# Patient Record
Sex: Female | Born: 1935
Health system: Southern US, Community
[De-identification: ages and names within clinical notes are randomized; demographics above are authoritative.]

## PROBLEM LIST (undated history)

## (undated) DIAGNOSIS — F32A Depression, unspecified: Secondary | ICD-10-CM

## (undated) DIAGNOSIS — Z8619 Personal history of other infectious and parasitic diseases: Secondary | ICD-10-CM

## (undated) DIAGNOSIS — I214 Non-ST elevation (NSTEMI) myocardial infarction: Secondary | ICD-10-CM

## (undated) DIAGNOSIS — K219 Gastro-esophageal reflux disease without esophagitis: Secondary | ICD-10-CM

## (undated) DIAGNOSIS — I480 Paroxysmal atrial fibrillation: Secondary | ICD-10-CM

## (undated) DIAGNOSIS — E785 Hyperlipidemia, unspecified: Secondary | ICD-10-CM

## (undated) DIAGNOSIS — N183 Chronic kidney disease, stage 3 unspecified: Secondary | ICD-10-CM

## (undated) DIAGNOSIS — C439 Malignant melanoma of skin, unspecified: Secondary | ICD-10-CM

## (undated) DIAGNOSIS — I340 Nonrheumatic mitral (valve) insufficiency: Secondary | ICD-10-CM

## (undated) DIAGNOSIS — F329 Major depressive disorder, single episode, unspecified: Secondary | ICD-10-CM

## (undated) HISTORY — DX: Hyperlipidemia, unspecified: E78.5

## (undated) HISTORY — DX: Gastro-esophageal reflux disease without esophagitis: K21.9

## (undated) HISTORY — DX: Depression, unspecified: F32.A

## (undated) HISTORY — DX: Nonrheumatic mitral (valve) insufficiency: I34.0

## (undated) HISTORY — PX: CATARACT EXTRACTION: SUR2

## (undated) HISTORY — PX: TONSILLECTOMY: SUR1361

## (undated) HISTORY — PX: SKIN BIOPSY: SHX1

## (undated) HISTORY — DX: Major depressive disorder, single episode, unspecified: F32.9

## (undated) HISTORY — DX: Personal history of other infectious and parasitic diseases: Z86.19

## (undated) HISTORY — DX: Paroxysmal atrial fibrillation: I48.0

## (undated) HISTORY — PX: APPENDECTOMY: SHX54

## (undated) HISTORY — DX: Malignant melanoma of skin, unspecified: C43.9

---

## 1997-06-07 ENCOUNTER — Ambulatory Visit (HOSPITAL_COMMUNITY): Admission: RE | Admit: 1997-06-07 | Discharge: 1997-06-07 | Payer: Self-pay | Admitting: Internal Medicine

## 1998-02-28 ENCOUNTER — Other Ambulatory Visit: Admission: RE | Admit: 1998-02-28 | Discharge: 1998-02-28 | Payer: Self-pay | Admitting: Internal Medicine

## 1998-06-11 ENCOUNTER — Encounter (HOSPITAL_BASED_OUTPATIENT_CLINIC_OR_DEPARTMENT_OTHER): Payer: Self-pay | Admitting: Internal Medicine

## 1998-06-11 ENCOUNTER — Ambulatory Visit (HOSPITAL_COMMUNITY): Admission: RE | Admit: 1998-06-11 | Discharge: 1998-06-11 | Payer: Self-pay | Admitting: Internal Medicine

## 1999-03-05 ENCOUNTER — Other Ambulatory Visit: Admission: RE | Admit: 1999-03-05 | Discharge: 1999-03-05 | Payer: Self-pay | Admitting: Internal Medicine

## 1999-06-17 ENCOUNTER — Encounter (HOSPITAL_BASED_OUTPATIENT_CLINIC_OR_DEPARTMENT_OTHER): Payer: Self-pay | Admitting: Internal Medicine

## 1999-06-17 ENCOUNTER — Ambulatory Visit (HOSPITAL_COMMUNITY): Admission: RE | Admit: 1999-06-17 | Discharge: 1999-06-17 | Payer: Self-pay | Admitting: Internal Medicine

## 2000-06-20 ENCOUNTER — Ambulatory Visit (HOSPITAL_COMMUNITY): Admission: RE | Admit: 2000-06-20 | Discharge: 2000-06-20 | Payer: Self-pay | Admitting: Internal Medicine

## 2000-06-20 ENCOUNTER — Encounter (HOSPITAL_BASED_OUTPATIENT_CLINIC_OR_DEPARTMENT_OTHER): Payer: Self-pay | Admitting: Internal Medicine

## 2001-07-11 ENCOUNTER — Encounter: Payer: Self-pay | Admitting: Internal Medicine

## 2001-07-11 ENCOUNTER — Ambulatory Visit (HOSPITAL_COMMUNITY): Admission: RE | Admit: 2001-07-11 | Discharge: 2001-07-11 | Payer: Self-pay | Admitting: Internal Medicine

## 2001-10-02 ENCOUNTER — Ambulatory Visit (HOSPITAL_COMMUNITY): Admission: RE | Admit: 2001-10-02 | Discharge: 2001-10-02 | Payer: Self-pay | Admitting: Internal Medicine

## 2002-08-08 ENCOUNTER — Encounter: Payer: Self-pay | Admitting: Internal Medicine

## 2002-08-08 ENCOUNTER — Ambulatory Visit (HOSPITAL_COMMUNITY): Admission: RE | Admit: 2002-08-08 | Discharge: 2002-08-08 | Payer: Self-pay | Admitting: Internal Medicine

## 2003-08-12 ENCOUNTER — Encounter: Admission: RE | Admit: 2003-08-12 | Discharge: 2003-08-12 | Payer: Self-pay | Admitting: Internal Medicine

## 2003-09-30 ENCOUNTER — Ambulatory Visit (HOSPITAL_COMMUNITY): Admission: RE | Admit: 2003-09-30 | Discharge: 2003-09-30 | Payer: Self-pay | Admitting: Internal Medicine

## 2004-05-27 ENCOUNTER — Ambulatory Visit: Payer: Self-pay | Admitting: Internal Medicine

## 2004-06-08 ENCOUNTER — Ambulatory Visit: Payer: Self-pay | Admitting: Internal Medicine

## 2004-10-13 ENCOUNTER — Ambulatory Visit (HOSPITAL_COMMUNITY): Admission: RE | Admit: 2004-10-13 | Discharge: 2004-10-13 | Payer: Self-pay | Admitting: Internal Medicine

## 2005-04-30 ENCOUNTER — Emergency Department (HOSPITAL_COMMUNITY): Admission: EM | Admit: 2005-04-30 | Discharge: 2005-04-30 | Payer: Self-pay | Admitting: Emergency Medicine

## 2005-11-04 ENCOUNTER — Ambulatory Visit (HOSPITAL_COMMUNITY): Admission: RE | Admit: 2005-11-04 | Discharge: 2005-11-04 | Payer: Self-pay | Admitting: Internal Medicine

## 2005-12-01 ENCOUNTER — Ambulatory Visit: Payer: Self-pay | Admitting: Internal Medicine

## 2006-02-03 ENCOUNTER — Ambulatory Visit: Payer: Self-pay | Admitting: Internal Medicine

## 2006-02-16 ENCOUNTER — Inpatient Hospital Stay (HOSPITAL_COMMUNITY): Admission: EM | Admit: 2006-02-16 | Discharge: 2006-02-17 | Payer: Self-pay | Admitting: Emergency Medicine

## 2006-02-17 ENCOUNTER — Encounter (INDEPENDENT_AMBULATORY_CARE_PROVIDER_SITE_OTHER): Payer: Self-pay | Admitting: *Deleted

## 2006-11-08 ENCOUNTER — Ambulatory Visit (HOSPITAL_COMMUNITY): Admission: RE | Admit: 2006-11-08 | Discharge: 2006-11-08 | Payer: Self-pay | Admitting: Internal Medicine

## 2007-03-22 ENCOUNTER — Ambulatory Visit: Payer: Self-pay

## 2007-06-06 ENCOUNTER — Ambulatory Visit: Payer: Self-pay | Admitting: *Deleted

## 2007-08-08 ENCOUNTER — Encounter: Admission: RE | Admit: 2007-08-08 | Discharge: 2007-08-08 | Payer: Self-pay | Admitting: Internal Medicine

## 2007-08-08 ENCOUNTER — Encounter (INDEPENDENT_AMBULATORY_CARE_PROVIDER_SITE_OTHER): Payer: Self-pay | Admitting: *Deleted

## 2007-11-14 ENCOUNTER — Ambulatory Visit (HOSPITAL_COMMUNITY): Admission: RE | Admit: 2007-11-14 | Discharge: 2007-11-14 | Payer: Self-pay | Admitting: Internal Medicine

## 2008-11-18 ENCOUNTER — Ambulatory Visit (HOSPITAL_COMMUNITY): Admission: RE | Admit: 2008-11-18 | Discharge: 2008-11-18 | Payer: Self-pay | Admitting: Internal Medicine

## 2009-05-28 ENCOUNTER — Encounter (INDEPENDENT_AMBULATORY_CARE_PROVIDER_SITE_OTHER): Payer: Self-pay | Admitting: *Deleted

## 2009-07-01 ENCOUNTER — Encounter: Payer: Self-pay | Admitting: Internal Medicine

## 2009-07-02 ENCOUNTER — Ambulatory Visit: Payer: Self-pay | Admitting: Internal Medicine

## 2009-07-02 DIAGNOSIS — R413 Other amnesia: Secondary | ICD-10-CM | POA: Insufficient documentation

## 2009-07-02 DIAGNOSIS — R0602 Shortness of breath: Secondary | ICD-10-CM | POA: Insufficient documentation

## 2009-07-02 DIAGNOSIS — R4701 Aphasia: Secondary | ICD-10-CM | POA: Insufficient documentation

## 2009-07-02 DIAGNOSIS — F801 Expressive language disorder: Secondary | ICD-10-CM

## 2009-07-02 DIAGNOSIS — E785 Hyperlipidemia, unspecified: Secondary | ICD-10-CM

## 2009-07-03 ENCOUNTER — Telehealth: Payer: Self-pay | Admitting: Internal Medicine

## 2009-07-07 ENCOUNTER — Encounter (INDEPENDENT_AMBULATORY_CARE_PROVIDER_SITE_OTHER): Payer: Self-pay | Admitting: *Deleted

## 2009-07-07 ENCOUNTER — Telehealth: Payer: Self-pay | Admitting: Internal Medicine

## 2009-07-14 ENCOUNTER — Telehealth: Payer: Self-pay | Admitting: Internal Medicine

## 2009-07-22 ENCOUNTER — Ambulatory Visit: Payer: Self-pay

## 2009-07-22 ENCOUNTER — Encounter: Payer: Self-pay | Admitting: Internal Medicine

## 2009-07-22 ENCOUNTER — Ambulatory Visit (HOSPITAL_COMMUNITY): Admission: RE | Admit: 2009-07-22 | Discharge: 2009-07-22 | Payer: Self-pay | Admitting: Internal Medicine

## 2009-07-22 ENCOUNTER — Ambulatory Visit: Payer: Self-pay | Admitting: Cardiovascular Disease

## 2009-07-28 ENCOUNTER — Telehealth: Payer: Self-pay | Admitting: Internal Medicine

## 2009-07-29 DIAGNOSIS — Z860100 Personal history of colon polyps, unspecified: Secondary | ICD-10-CM | POA: Insufficient documentation

## 2009-07-29 DIAGNOSIS — K5730 Diverticulosis of large intestine without perforation or abscess without bleeding: Secondary | ICD-10-CM | POA: Insufficient documentation

## 2009-07-29 DIAGNOSIS — K219 Gastro-esophageal reflux disease without esophagitis: Secondary | ICD-10-CM | POA: Insufficient documentation

## 2009-07-29 DIAGNOSIS — J449 Chronic obstructive pulmonary disease, unspecified: Secondary | ICD-10-CM

## 2009-07-29 DIAGNOSIS — A389 Scarlet fever, uncomplicated: Secondary | ICD-10-CM | POA: Insufficient documentation

## 2009-08-05 ENCOUNTER — Ambulatory Visit: Payer: Self-pay | Admitting: Internal Medicine

## 2009-08-26 ENCOUNTER — Ambulatory Visit: Payer: Self-pay | Admitting: Internal Medicine

## 2009-08-26 DIAGNOSIS — I4891 Unspecified atrial fibrillation: Secondary | ICD-10-CM | POA: Insufficient documentation

## 2009-09-05 ENCOUNTER — Ambulatory Visit: Payer: Self-pay

## 2009-09-05 ENCOUNTER — Ambulatory Visit: Payer: Self-pay | Admitting: Internal Medicine

## 2009-09-10 ENCOUNTER — Encounter: Payer: Self-pay | Admitting: Internal Medicine

## 2009-09-23 ENCOUNTER — Ambulatory Visit: Payer: Self-pay | Admitting: Internal Medicine

## 2009-09-23 ENCOUNTER — Ambulatory Visit: Payer: Self-pay

## 2009-09-23 ENCOUNTER — Encounter: Payer: Self-pay | Admitting: Internal Medicine

## 2009-10-07 ENCOUNTER — Ambulatory Visit: Payer: Self-pay | Admitting: Internal Medicine

## 2009-10-23 ENCOUNTER — Telehealth: Payer: Self-pay | Admitting: Internal Medicine

## 2009-11-05 ENCOUNTER — Ambulatory Visit: Payer: Self-pay | Admitting: Internal Medicine

## 2009-11-05 DIAGNOSIS — R5383 Other fatigue: Secondary | ICD-10-CM

## 2009-11-05 DIAGNOSIS — R5381 Other malaise: Secondary | ICD-10-CM

## 2009-12-29 ENCOUNTER — Ambulatory Visit (HOSPITAL_COMMUNITY): Admission: RE | Admit: 2009-12-29 | Discharge: 2009-12-29 | Payer: Self-pay | Admitting: Internal Medicine

## 2010-02-09 ENCOUNTER — Ambulatory Visit: Payer: Self-pay | Admitting: Internal Medicine

## 2010-04-16 ENCOUNTER — Telehealth: Payer: Self-pay | Admitting: Internal Medicine

## 2010-04-17 ENCOUNTER — Encounter: Payer: Self-pay | Admitting: Internal Medicine

## 2010-04-30 ENCOUNTER — Encounter: Payer: Self-pay | Admitting: Internal Medicine

## 2010-05-03 LAB — CONVERTED CEMR LAB
Basophils Absolute: 0.1 10*3/uL (ref 0.0–0.1)
Basophils Relative: 0.7 % (ref 0.0–3.0)
CO2: 32 meq/L (ref 19–32)
Creatinine, Ser: 1.2 mg/dL (ref 0.4–1.2)
HCT: 39 % (ref 36.0–46.0)
Hemoglobin: 13.4 g/dL (ref 12.0–15.0)
Monocytes Relative: 10.1 % (ref 3.0–12.0)
Potassium: 5 meq/L (ref 3.5–5.1)
RBC: 4.15 M/uL (ref 3.87–5.11)
RDW: 14.2 % (ref 11.5–14.6)
T3, Free: 2.4 pg/mL (ref 2.3–4.2)
WBC: 7.6 10*3/uL (ref 4.5–10.5)

## 2010-05-05 NOTE — Assessment & Plan Note (Signed)
Summary: NEEDS COLONOSCOPY, IS ON COUMADIN          Nancy [name]   History of Present Illness Visit Type: Initial Visit Primary GI MD: Lina Sar MD Primary Provider: Creola Corn, MD Chief Complaint: fecal urgency, loose stools, patient on coumadin History of Present Illness:   This is a very nice 77 year old white female with a history of adenmatous polyps seen on a colonoscopy in 1998 which was a tubular adenoma. There were no polyps on hier last colonoscopy in March 2006 but it did show moderately severe diverticulosis of the sigmoid colon. Patient has been on Coumadin for a questionable history of TIA's. She is very active. A recall colonoscopy would be due in 5 years according to old guidelines but 7-10 years according to the new guidelines. Her symptoms include urgent, frequent small bowel movements and leakage of stool.   GI Review of Systems      Denies abdominal pain, acid reflux, belching, bloating, chest pain, dysphagia with liquids, dysphagia with solids, heartburn, loss of appetite, nausea, vomiting, vomiting blood, weight loss, and  weight gain.      Reports change in bowel habits and  diarrhea.     Denies anal fissure, black tarry stools, constipation, diverticulosis, fecal incontinence, heme positive stool, hemorrhoids, irritable bowel syndrome, jaundice, light color stool, liver problems, rectal bleeding, and  rectal pain.    Current Medications (verified): 1)  Multivitamins   Tabs (Multiple Vitamin) .Marland Kitchen.. 1 By Mouth Daily 2)  Oscal 500/200 D-3 500-200 Mg-Unit Tabs (Calcium-Vitamin D) .Marland Kitchen.. 1 By Mouth Daily 3)  Vitamin B Complex-C   Caps (B Complex-C) .Marland Kitchen.. 1 By Mouth Daily 4)  Biotin .Marland Kitchen.. 1 By Mouth Daily 5)  Aspirin 81 Mg Tbec (Aspirin) .... Take One Tablet By Mouth Daily 6)  Metoprolol Succinate 50 Mg Xr24h-Tab (Metoprolol Succinate) .... Take One Tablet By Mouth Daily 7)  Coumadin 3 Mg Tabs (Warfarin Sodium) .... Once Daily As Directed 8)  Simvastatin 20 Mg Tabs (Simvastatin)  .... Once Daily 9)  Ginkoba 40 Mg Tabs (Ginkgo Biloba) .... Once Daily  Allergies (verified): No Known Drug Allergies  Past History:  Past Medical History: 1. Migraines 2. GERD 3. Dyslipidemia 4. Scarlet fever as a child 5. H/o possible TIA     --carotid u/s 06/2007; 1-39% B Depression Melenoma  Past Surgical History: Reviewed history from 07/29/2009 and no changes required. Tonsillectomy Appendectomy Cataract extraction-bilateral  Family History: Reviewed history from 07/29/2009 and no changes required. Negative for stroke or premature coronary disease No FH of Colon Cancer: Family History of Colon Polyps:  Social History: Married --  husband who has dementia Tobacco Use - Former.  Alcohol Use - yes-occasional Illicit Drug Use - no Daily Caffeine Use 2-3  Review of Systems       The patient complains of cough, fatigue, heart rhythm changes, and night sweats.  The patient denies allergy/sinus, anemia, anxiety-new, arthritis/joint pain, back pain, blood in urine, breast changes/lumps, change in vision, confusion, coughing up blood, depression-new, fainting, fever, headaches-new, hearing problems, heart murmur, itching, menstrual pain, muscle pains/cramps, nosebleeds, pregnancy symptoms, shortness of breath, skin rash, sleeping problems, sore throat, swelling of feet/legs, swollen lymph glands, thirst - excessive , urination - excessive , urination changes/pain, urine leakage, vision changes, and voice change.         Pertinent positive and negative review of systems were noted in the above HPI. All other ROS was otherwise negative.   Vital Signs:  Patient profile:   77 year old  female Height:      65 inches Weight:      149.13 pounds BMI:     24.91 Pulse rate:   84 / minute Pulse rhythm:   regular BP sitting:   132 / 60  (left arm) Cuff size:   regular  Vitals Entered By: June McMurray CMA Duncan Dull) (Aug 05, 2009 4:38 PM)  Physical Exam  General:  Well developed,  well nourished, no acute distress. Eyes:  PERRLA, no icterus. Neck:  Supple; no masses or thyromegaly. Lungs:  Clear throughout to auscultation. Heart:  Regular rate and rhythm; no murmurs, rubs,  or bruits. Abdomen:  Nontender abdomen with minimal discomfort in left lower quadrant but no palpable mass. Liver edge at costal margin. Bowel sounds are normal active. Rectal:  decreased rectal sphincter tone with small amount of stool around the rectal area and small amount of Hemoccult negative stool in the rectal ampulla. There are no hemorrhoids. Extremities:  No clubbing, cyanosis, edema or deformities noted. Skin:  Intact without significant lesions or rashes. Psych:  Alert and cooperative. Normal mood and affect.   Impression & Recommendations:  Problem # 1:  COLONIC POLYPS, ADENOMATOUS, HX OF (ICD-V12.72) Patient has a history of an adenomatous polyp in 1998. There were no polyps on 2 subsequent colonoscopies. There is no need for a recall colonoscopy at this time especially since she is on Coumadin and taking her off Coumadin would pose a high thromboembolic risk.  Problem # 2:  DIVERTICULOSIS, COLON (ICD-562.10) Patient has moderately severe diverticulosis of the sigmoid colon which is symptomatic. She has partial narrowing of the left colon. I advised patient to start Metamucil one heaping teaspoon daily and start Bentyl 10 mg p.r.n. frequent stools as well.  Patient Instructions: 1)  Metamucil 1 teaspoon daily. 2)  Bentyl 10 mg p.o. b.i.d. p.r.n. frequent bowel movements. 3)  No need for recall colonoscopy due to age. 4)  Copy sent to : Dr Creola Corn 5)  The medication list was reviewed and reconciled.  All changed / newly prescribed medications were explained.  A complete medication list was provided to the patient / caregiver. Prescriptions: BENTYL 10 MG CAPS (DICYCLOMINE HCL) Take 1 capsule by mouth two times a day as needed frequent bowel movements  #60 x 2   Entered by:    Lamona Curl CMA (AAMA)   Authorized by:   Hart Carwin MD   Signed by:   Lamona Curl CMA (AAMA) on 08/05/2009   Method used:   [facility]        [facility] [address] [phone]* (retail)       [address]       [place], [place]  [address]       Ph: [phone] or [phone]       Fax: [phone]   RxID:   [id number]

## 2010-05-05 NOTE — Progress Notes (Signed)
Summary: Triage  Phone Note Call from Patient Call back at Home Phone [phone]   Caller: Patient Call For: Dr. Juanda Chance Reason for Call: Talk to Nurse Summary of Call: pt would like to sch REC COL... on Coumadin... no NP3 appts available Initial call taken by: Vallarie Mare,  July 07, 2009 11:45 AM  Follow-up for Phone Call        Message left for patient to callback. Laureen Ochs LPN  July 08, 6438 11:54 AM  Message left for patient to callback. Laureen Ochs LPN  July 07, 3472 2:28 PM   Pt. is scheduled to see Dr.Brodie on 08-05-09 at 3:45pm. Pt. instructed to call back as needed.  Follow-up by: Laureen Ochs LPN,  July 07, 2009 4:32 PM

## 2010-05-05 NOTE — Procedures (Signed)
Summary: COLON   Colonoscopy  Procedure date:  06/08/2004  Findings:      Location:  [facility].    Procedures Next Due Date:    Colonoscopy: 06/2009 Patient Name: Nancy [name], Nancy [name] MRN:  Procedure Procedures: Colonoscopy CPT: [phone].  Personnel: Endoscopist: Katilynn Sinkler L. Juanda Chance, MD.  Referred By: Creola Corn, MD.  Exam Location: Exam performed in Outpatient Clinic. Outpatient  Patient Consent: Procedure, Alternatives, Risks and Benefits discussed, consent obtained, from patient. Consent was obtained by the RN.  Indications  Surveillance of: Adenomatous Polyp(s). Initial polypectomy was performed in 1998. 1-2 Polyps were found at Index Exam. Largest polyp removed was 1 to 5 mm. Prior polyp located in proximal (splenic flexure and beyond) colon. Pathology of worst  polyp: tubular adenoma. The patient has not had surgery. Previous surveillance exam(s) in  2001,  History  Current Medications: Patient is not currently taking Coumadin.  Pre-Exam Physical: Performed Jun 08, 2004. Entire physical exam was normal.  Exam Exam: Extent of exam reached: Cecum, extent intended: Cecum.  The cecum was identified by appendiceal orifice and IC valve. Colon retroflexion performed. Images taken. ASA Classification: I. Tolerance: good.  Monitoring: Pulse and BP monitoring, Oximetry used. Supplemental O2 given.  Colon Prep Used Miralax for colon prep. Prep results: good.  Sedation Meds: Patient assessed and found to be appropriate for moderate (conscious) sedation. Fentanyl 75 mcg. given IV. Versed 6 mg. given IV.  Findings - NORMAL EXAM: Cecum.  - DIVERTICULOSIS: Descending Colon to Sigmoid Colon. ICD9: Diverticulosis, Colon: 562.10. Comments: moderately severe divertic., narrow lumen, large folds.   Assessment Abnormal examination, see findings above.  Diagnoses: 562.10: Diverticulosis, Colon.   Comments: no recurrent polyps Events  Unplanned Interventions: No  intervention was required.  Unplanned Events: There were no complications. Plans Patient Education: Patient given standard instructions for: Patient instructed to get routine colonoscopy every 5, years.  Disposition: After procedure patient sent to recovery. After recovery patient sent home.   This report was created from the original endoscopy report, which was reviewed and signed by the above listed endoscopist.

## 2010-05-05 NOTE — Progress Notes (Signed)
Summary: pt rtn your call  Phone Note Call from Patient Call back at Home Phone [phone]   Caller: Patient Reason for Call: Talk to Nurse, Talk to Doctor, Lab or Test Results Summary of Call: pt rtn your call regarding results Initial call taken by: Omer Jack,  July 03, 2009 1:32 PM  Follow-up for Phone Call        pts heart monitor that was placed 3/30 showed a-fib w/elevated rates, per Dr Leory Plowman add toprol 50mg  daily, asa 81mg  daily, stop plavix and start coumadin 3mg  daily, f/u w/Dr Timothy Lasso for coumadin checks and cont. wearing monitor, pt is aware of all the above, new rxs sent in pt will call Dr Timothy Lasso to make appt for couamdin check on Monday 4/4.  Dr Gala Romney called and discussed results w/Dr Elige Radon, RN  July 03, 2009 1:51 PM     New/Updated Medications: ASPIRIN 81 MG TBEC (ASPIRIN) Take one tablet by mouth daily METOPROLOL SUCCINATE 50 MG XR24H-TAB (METOPROLOL SUCCINATE) Take one tablet by mouth daily COUMADIN 3 MG TABS (WARFARIN SODIUM) once daily as directed Prescriptions: COUMADIN 3 MG TABS (WARFARIN SODIUM) once daily as directed  #45 x 0   Entered by:   Meredith Staggers, RN   Authorized by:   Dolores Patty, MD, [facility]   Signed by:   Meredith Staggers, RN on 07/03/2009   Method used:   [facility]        [facility] [address] [phone]* (retail)       [address]       [place], [place]  [address]       Ph: [phone] or [phone]       Fax: [phone]   RxID:   [id number] METOPROLOL SUCCINATE 50 MG XR24H-TAB (METOPROLOL SUCCINATE) Take one tablet by mouth daily  #30 x 6   Entered by:   Meredith Staggers, RN   Authorized by:   Dolores Patty, MD, [facility]   Signed by:   Meredith Staggers, RN on 07/03/2009   Method used:   [facility]ically to        [facility] [address] [phone]* (retail)       [address]       [place], [place]  [address]       Ph: [phone] or [phone]       Fax:  [phone]   RxID:   [id number]

## 2010-05-05 NOTE — Letter (Signed)
Summary: [facility] Progress Note   [facility] Progress Note   Imported By: Roderic Ovens 09/29/2009 14:26:58  _____________________________________________________________________  External Attachment:    Type:   Image     Comment:   External Document

## 2010-05-05 NOTE — Assessment & Plan Note (Signed)
Summary: 1 month rov/sl   Visit Type:  Follow-up Primary Nancy [name]:  Nancy Corn, MD  CC:  shortness of breath.  History of Present Illness: Nancy [name] is an 77 y/o woman with a history of hyperlipidemia, migraines, possible TIA and recently diagnosed AF.  Started on metoprolol and coumadin. Had ETT which was normal and echo EF 55-60%. moderate MR.  F/u montior revealed multiple episdoes of atrial fibrillation with RVR so started on Flecainide. On 6/21, had repeat ETT to exclude proarrhytmia with Flecainide. At start of test patient was in sinus rhythm. During early exercise she developed brief salvos of SVT. Then she developed sustained SVT/atrial flutter with rates up to 180 with signifcant ST depression. This was asymptomatic. With vagal maneuvers SVT was broken and then went into atiral fib for a chort period of time and then back into sinus rhythm. Flecainide stopped and amiodarone 400 two times a day prescribed.  Returns today for f/u. Had monitor recently which showed that she was maintaining sinus rhythm. However, remains very fatigued with almost any activity including doing her hair. Gets SOB with most activity. However says she can go to store and walk w/o problem. No palpitations or syncope. No edema. No bleeding on coumadin.   Current Medications (verified): 1)  Multivitamins   Tabs (Multiple Vitamin) .Marland Kitchen.. 1 By Mouth Daily 2)  Oscal 500/200 D-3 500-200 Mg-Unit Tabs (Calcium-Vitamin D) .Marland Kitchen.. 1 By Mouth Daily 3)  Aspirin 81 Mg Tbec (Aspirin) .... Take One Tablet By Mouth Daily 4)  Coumadin 3 Mg Tabs (Warfarin Sodium) .... Once Daily As Directed 5)  Simvastatin 20 Mg Tabs (Simvastatin) .... Once Daily (Out) 6)  Ginkoba 40 Mg Tabs (Ginkgo Biloba) .... Once Daily 7)  Amiodarone Hcl 200 Mg Tabs (Amiodarone Hcl) .... Take 1 Tablet By Mouth Twice A Day 8)  Bentyl 10 Mg Caps (Dicyclomine Hcl) .... As Needed For Frequent Bowel Movements 9)  Ginkgo Biloba   Extr (Ginkgo Biloba) .... Once  Daily 10)  Fish Oil   Oil (Fish Oil) .... Once Daily 11)  Vitamin B-6 100 Mg Tabs (Pyridoxine Hcl) .... Once Daily  Allergies (verified): No Known Drug Allergies  Past History:  Past Medical History: Last updated: 08/26/2009 1. Migraines 2. GERD 3. Dyslipidemia 4. Scarlet fever as a child 5. H/o possible TIA     --carotid u/s 06/2007; 1-39% B 6. Paroxysmal. atrial fibrillation      --ETT nomral      --echo EF 55%-60% moderate MR         Depression Melenoma  Review of Systems       As per HPI and past medical history; otherwise all systems negative.   Vital Signs:  Patient profile:   77 year old female Height:      65 inches Weight:      147 pounds BMI:     24.55 Pulse rate:   64 / minute BP sitting:   130 / 72  (right arm) Cuff size:   regular  Vitals Entered By: Hardin Negus, RMA (November 05, 2009 11:15 AM)  Physical Exam  General:   Elderly no distress. HEENT: normal Neck: supple. no JVD. Carotids 2+ bilat; no bruits. No lymphadenopathy or thryomegaly appreciated. Cor: PMI nondisplaced. Huston Foley and regular. No rubs, gallops, murmur. Lungs: clear Abdomen: soft, nontender, nondistended.  Good bowel sounds. Extremities: no cyanosis, clubbing, rash, edema Neuro: alert & orientedx3, cranial nerves grossly intact. FTN ok. No drift. moves all 4 extremities w/o difficulty. affect pleasant  Impression & Recommendations:  Problem # 1:  ATRIAL FIBRILLATION, PAROXYSMAL (ICD-427.31) Seems to be maintaining SR well on amio by monitor (hard to tell by her symptoms). Given fatigue will cut amio to 200 once daily and also check thyroid panel and CBC. Continue coumadin. No need for pacer at this point as HR has come up.   Problem # 2:  FATIGUE / MALAISE (ICD-780.79) As above.   Other Orders: EKG w/ Interpretation (93000) TLB-BMP (Basic Metabolic Panel-BMET) (80048-METABOL) TLB-CBC Platelet - w/Differential (85025-CBCD) TLB-T4 (Thyrox), Free [id number]) TLB-TSH  (Thyroid Stimulating Hormone) (84443-TSH) TLB-T3, Free (Triiodothyronine) (84481-T3FREE)  Patient Instructions: 1)  Labs today 2)  Follow up in 3 months

## 2010-05-05 NOTE — Letter (Signed)
Summary: Colonoscopy Letter  [facility]  [address] [place], [place] [address]   Phone: [phone]  Fax: [phone]      May 28, 2009 MRN: [id number]   [facility] [address] [place], [place]  [address]   Dear Ms. Carducci,   According to your medical record, it is time for you to schedule a Colonoscopy. The American Cancer Society recommends this procedure as a method to detect early colon cancer. Patients with a family history of colon cancer, or a personal history of colon polyps or inflammatory bowel disease are at increased risk.  This letter has beeen generated based on the recommendations made at the time of your procedure. If you feel that in your particular situation this may no longer apply, please contact our office.  Please call our office at [phone] to schedule this appointment or to update your records at your earliest convenience.  Thank you for cooperating with [place] to provide you with the very best care possible.   Sincerely,  Hedwig Morton. Juanda Chance, M.D.  [facility] [phone]

## 2010-05-05 NOTE — Consult Note (Signed)
Summary: [facility]   Imported By: Marylou Mccoy 07/02/2009 09:13:48  _____________________________________________________________________  External Attachment:    Type:   Image     Comment:   External Document

## 2010-05-05 NOTE — Assessment & Plan Note (Signed)
Summary: 9:30 np6   History of Present Illness: Nancy [name] is an 77 y/o healthy woman with a history of hyperlipidemia, migraines and possible TIA referred by [name] for episode of dyspnea.   Denies any h/o known heart disease. Has never had stress test or cath.  Very, very active. 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 However continues to exercise 5 days a week wit low-impact aerobics and genral fitness class. Typically plays doubles tennis without problem. Yesterday was playing tennis and became very short of breath and had to stop. No CP or palpitations. Has had similar episodes in past but nothing this severe. When she was having lunch had sometransient  trouble with word finding and mild expressive aphasia. Reports possible TIA with expressive aphasia many years ago says she did not have w/u but on chart I see carotid u/s 1-39% in in 2009. Now back to normal.  A friend had a HR monitor that she place on her wrist. Initially read 190 but then came down and fluctuated between 84 and 170 bpm. Then stayed in 80s. Went to [name] office yesterday and ECG SR 79. No ST-T wave abnormalities.  Husband in nursing home.  Current Medications (verified): 1)  Plavix 75 Mg Tabs (Clopidogrel Bisulfate) .Marland Kitchen.. 1 By Mouth Daily 2)  Multivitamins   Tabs (Multiple Vitamin) .Marland Kitchen.. 1 By Mouth Daily 3)  Oscal 500/200 D-3 500-200 Mg-Unit Tabs (Calcium-Vitamin D) .Marland Kitchen.. 1 By Mouth Daily 4)  Vitamin B Complex-C   Caps (B Complex-C) .Marland Kitchen.. 1 By Mouth Daily 5)  Biotin .Marland Kitchen.. 1 By Mouth Daily  Allergies (verified): No Known Drug Allergies  Past History:  Family History: Last updated: 07/02/2009 Negative for stroke or premature coronary disease  Social History: Last updated: 07/02/2009 Married --  husband who has dementia Tobacco Use - Former.   Risk Factors: Smoking Status: quit (07/02/2009)  Past Medical History: 1. Migraines 2. GERD 3. Dyslipidemia 4. Scarlet fever as a  child 5. H/o possible TIA     --carotid u/s 06/2007; 1-39% B  Family History: Reviewed history from 07/02/2009 and no changes required. Negative for stroke or premature coronary disease  Social History: Reviewed history from 07/02/2009 and no changes required. Married --  husband who has dementia Tobacco Use - Former.   Review of Systems       As per HPI and past medical history; otherwise all systems negative.   Vital Signs:  Patient profile:   77 year old female Height:      65 inches Weight:      149 pounds BMI:     24.88 Pulse rate:   84 / minute Resp:     16 per minute BP sitting:   112 / 64  (left arm)  Vitals Entered By: [name], CNA (July 02, 2009 9:57 AM)   Physical Exam  General:  Elderly but fit appearing. no distress. HEENT: normal Neck: supple. no JVD. Carotids 2+ bilat; no bruits. No lymphadenopathy or thryomegaly appreciated. Cor: PMI nondisplaced. Regular rate & rhythm. No rubs, gallops, murmur. Lungs: clear Abdomen: soft, nontender, nondistended.  Good bowel sounds. Extremities: no cyanosis, clubbing, rash, edema Neuro: alert & orientedx3, cranial nerves grossly intact. FTN ok. No drift. moves all 4 extremities w/o difficulty. affect pleasant    Impression & Recommendations:  Problem # 1:  SHORTNESS OF BREATH (ICD-786.05) Symptoms very concerning for paroxysmal AF with possible small embolic events. Will check stress test today to r/o ischemia  and then pursue aggressive w/u for AF including 2 week event monitor and echo. IF  AF documented, will need warfarin as CHADSVASC = 3.  Orders: Carotid Duplex (Carotid Duplex) Echocardiogram (Echo) Event (Event)  Problem # 2:  EXPRESSIVE LANGUAGE DISORDER (ICD-315.31) Tranisent aphasia worrisome for TIA. Repeat carotid u/s. Spoke with [name] who will f/u with brain MRI +/- MRA  Problem # 3:  MEMORY LOSS (ICD-780.93) She had no recollection of work-up for previous TIA and seems to have other gaps  in memory which she seems to cover with a flip attitude. Consider neurocognitive testing.  CHF Assessment/Plan:      The patient's current weight is 149 pounds.     Patient Instructions: 1)  Your physician recommends that you schedule a follow-up appointment in: 2 months with Dr Gala Romney 2)  Your physician recommends that you continue on your current medications as directed. Please refer to the Current Medication list given to you today. 3)  Your physician has requested that you have a carotid duplex. This test is an ultrasound of the carotid arteries in your neck. It looks at blood flow through these arteries that supply the brain with blood. Allow one hour for this exam. There are no restrictions or special instructions. 4)  Your physician has requested that you have an echocardiogram.  Echocardiography is a painless test that uses sound waves to create images of your heart. It provides your doctor with information about the size and shape of your heart and how well your heart's chambers and valves are working.  This procedure takes approximately one hour. There are no restrictions for this procedure. 5)  Your physician has recommended that you wear an event monitor.  Event monitors are medical devices that record the heart's electrical activity. Doctors most often use these monitors to diagnose arrhythmias. Arrhythmias are problems with the speed or rhythm of the heartbeat. The monitor is a small, portable device. You can wear one while you do your normal daily activities. This is usually used to diagnose what is causing palpitations/syncope (passing out).  need to wear it for 2 weeks

## 2010-05-05 NOTE — Assessment & Plan Note (Signed)
Summary: 2wk f/u at 9am per heather/sl   Visit Type:  Follow-up Primary Provider:  Creola Corn, MD  CC:  pt had a nose bleeding this morning.Pt was onlt taking her amiodarone 1 tab bid but the bottle reads 2 tabs bid.  History of Present Illness: Nancy [name] is an 77 y/o healthy woman with a history of hyperlipidemia, migraines, possible TIA and question early dementia. We saw her for the first time earlier this year for an episode of palpitations and CP.   Started on metoprolol and coumadin. Had ETT which was normal and echo EF 55-60%. moderate MR.  F/u montior revealed multiple episdoes of atrial fibrillation with RVR so started on Flecainide.  On 6/21, had repeat ETT to exclude proarrhytmia tith Flecainide. At start of test patient was in sinus rhythm. During early exercise she developed brief salvos of SVT. Then she developed sustained SVT/atrial flutter with rates up to 180 with signifcant ST depression. This was asymptomatic. With vagal maneuvers SVT was broken and then went into atiral fib for a chort period of time and then back into sinus rhythm. Flecainide stopped and amiodarone 400 two times a day prescribed.  Returns today and says she remains exhausted and short of breath. Can only work 5 mins in the garden before she has to stop. Denies palpitations but she says she cant tell. Gets dizzy when standing up. Feels it is the humidity. Denies syncope. Only taking amiodarone 200 mg two times a day.   Had mild epistaxis this morning after blowing her nose. Otherwise no bleeding. INR checked last week.   Current Medications (verified): 1)  Multivitamins   Tabs (Multiple Vitamin) .Marland Kitchen.. 1 By Mouth Daily 2)  Oscal 500/200 D-3 500-200 Mg-Unit Tabs (Calcium-Vitamin D) .Marland Kitchen.. 1 By Mouth Daily 3)  Vitamin B Complex-C   Caps (B Complex-C) .Marland Kitchen.. 1 By Mouth Daily 4)  Biotin .Marland Kitchen.. 1 By Mouth Daily 5)  Aspirin 81 Mg Tbec (Aspirin) .... Take One Tablet By Mouth Daily 6)  Metoprolol Succinate 25 Mg Xr24h-Tab  (Metoprolol Succinate) .... Take One Tablet By Mouth Daily 7)  Coumadin 3 Mg Tabs (Warfarin Sodium) .... Once Daily As Directed 8)  Simvastatin 20 Mg Tabs (Simvastatin) .... Once Daily (Out) 9)  Ginkoba 40 Mg Tabs (Ginkgo Biloba) .... Once Daily 10)  Amiodarone Hcl 200 Mg Tabs (Amiodarone Hcl) .... Take 1 Tablet By Mouth Twice A Day 11)  Bentyl 10 Mg Caps (Dicyclomine Hcl) .... As Needed For Frequent Bowel Movements  Allergies (verified): No Known Drug Allergies  Past History:  Past Medical History: Last updated: 08/26/2009 1. Migraines 2. GERD 3. Dyslipidemia 4. Scarlet fever as a child 5. H/o possible TIA     --carotid u/s 06/2007; 1-39% B 6. Paroxysmal. atrial fibrillation      --ETT nomral      --echo EF 55%-60% moderate MR         Depression Melenoma  Review of Systems       As per HPI and past medical history; otherwise all systems negative.   Vital Signs:  Patient profile:   77 year old female Height:      65 inches Weight:      147 pounds BMI:     24.55 Pulse rate:   48 / minute BP sitting:   137 / 65  (left arm) Cuff size:   regular  Vitals Entered By: Burnett Kanaris, CNA (October 07, 2009 9:22 AM)  Physical Exam  General:   Elderly no  distress. HEENT: normal Neck: supple. no JVD. Carotids 2+ bilat; no bruits. No lymphadenopathy or thryomegaly appreciated. Cor: PMI nondisplaced. Huston Foley and regular. No rubs, gallops, murmur. Lungs: clear Abdomen: soft, nontender, nondistended.  Good bowel sounds. Extremities: no cyanosis, clubbing, rash, edema Neuro: alert & orientedx3, cranial nerves grossly intact. FTN ok. No drift. moves all 4 extremities w/o difficulty. affect pleasant   New Orders:     1)  EKG w/ Interpretation (93000)  Due: 10/07/2009     2)  Holter Monitor (Holter Monitor)  Due: 10/07/2009   Impression & Recommendations:  Problem # 1:  ATRIAL FIBRILLATION, PAROXYSMAL (ICD-427.31) By history it is very hard to know if she is having more salvos  of AF or AFL or if these have been suppressed by amio. She is quite bradycardic here. Will stop Toprol and get 2 week event monitor. Continue amio at 200 two times a day. Hopefully can decrease to 200 once daily soon. If bradycardia persists may need to consider pacer. Watch INR closely on amiodarone.   Other Orders: EKG w/ Interpretation (93000) Holter Monitor (Holter Monitor)  Patient Instructions: 1)  Your physician recommends that you schedule a follow-up appointment in: 1 month 2)  Your physician has recommended you make the following change in your medication: Stop metoprolol succinate 3)  Your physician has recommended that you wear a holter monitor.  Holter monitors are medical devices that record the heart's electrical activity. Doctors most often use these monitors to diagnose arrhythmias. Arrhythmias are problems with the speed or rhythm of the heartbeat. The monitor is a small, portable device. You can wear one while you do your normal daily activities. This is usually used to diagnose what is causing palpitations/syncope (passing out). 4)  Go to [facility] today after appointment here.  They will see you to adjust coumadin today.

## 2010-05-05 NOTE — Letter (Signed)
Summary: New Patient letter  [facility]  [address] [place], [place] [address]   Phone: [phone]  Fax: [phone]       07/07/2009 MRN: [id number]  [facility] [address] [place], [place]  [address]  Dear Ms. Mahlum,  Welcome to the Gastroenterology Division at [facility].    You are scheduled to see Dr.  Lina Sar on 08-05-09 at 3:45pm,  on the 3rd floor at [facility], [address]  We ask that you try to arrive at our office 15 minutes prior to your appointment time to allow for check-in.  We would like you to complete the enclosed self-administered evaluation form prior to your visit and bring it with you on the day of your appointment.  We will review it with you.  Also, please bring a complete list of all your medications or, if you prefer, bring the medication bottles and we will list them.  Please bring your insurance card so that we may make a copy of it.  If your insurance requires a referral to see a specialist, please bring your referral form from your primary care physician.  Co-payments are due at the time of your visit and may be paid by cash, check or credit card.     Your office visit will consist of a consult with your physician (includes a physical exam), any laboratory testing he/she may order, scheduling of any necessary diagnostic testing (e.g. x-ray, ultrasound, CT-scan), and scheduling of a procedure (e.g. Endoscopy, Colonoscopy) if required.  Please allow enough time on your schedule to allow for any/all of these possibilities.    If you cannot keep your appointment, please call [phone] to cancel or reschedule prior to your appointment date.  This allows [place] the opportunity to schedule an appointment for another patient in need of care.  If you do not cancel or reschedule by 5 p.m. the business day prior to your appointment date, you will be charged a $50.00 late cancellation/no-show fee.    Thank you for choosing  [facility] for your medical needs.  We appreciate the opportunity to care for you.  Please visit [place] at our website  to learn more about our practice.                     Sincerely,                                                             The Gastroenterology Division   Appended Document: New Patient letter Letter mailed to patient.

## 2010-05-05 NOTE — Assessment & Plan Note (Signed)
Summary: 3 month rov   Visit Type:  3 mov Primary Provider:  Creola Corn, MD  CC:  shortness of breath.  History of Present Illness: Nancy [name] is an 77 y/o woman with a history of hyperlipidemia, migraines, possible TIA and PAF.  Started on metoprolol and coumadin. Had ETT which was normal and echo EF 55-60%. moderate MR.  F/u montior revealed multiple episdoes of atrial fibrillation with RVR so started on Flecainide. On 6/21, had repeat ETT to exclude proarrhytmia with Flecainide. At start of test patient was in sinus rhythm. During early exercise she developed brief salvos of SVT. Then she developed sustained SVT/atrial flutter with rates up to 180 with signifcant ST depression. This was asymptomatic. With vagal maneuvers SVT was broken and then went into atiral fib for a chort period of time and then back into sinus rhythm. Flecainide stopped and amiodarone started.  In August was feeling fatigued so amio cut down to 200 once daily.  Returns today for f/u. Says she is feeling is very good. Not sure if she is taking amiodarone or not. Back to playing tennis. No palpitations or presuncope. Fatigue improved. Dyspnea much improved.  No edema. No bleeding on coumadin.    Current Medications (verified): 1)  Multivitamins   Tabs (Multiple Vitamin) .Marland Kitchen.. 1 By Mouth Daily 2)  Oscal 500/200 D-3 500-200 Mg-Unit Tabs (Calcium-Vitamin D) .Marland Kitchen.. 1 By Mouth Daily 3)  Aspirin 81 Mg Tbec (Aspirin) .... Take One Tablet By Mouth Daily 4)  Coumadin 3 Mg Tabs (Warfarin Sodium) .... Once Daily As Directed 5)  Ginkoba 40 Mg Tabs (Ginkgo Biloba) .... Once Daily 6)  Bentyl 10 Mg Caps (Dicyclomine Hcl) .... As Needed For Frequent Bowel Movements 7)  Vitamin B-6 100 Mg Tabs (Pyridoxine Hcl) .... Once Daily 8)  Amiodarone Hcl 200 Mg Tabs (Amiodarone Hcl) .... Take One Tablet By Mouth Daily  Allergies (verified): No Known Drug Allergies  Past History:  Past Medical History: Last updated: 08/26/2009 1.  Migraines 2. GERD 3. Dyslipidemia 4. Scarlet fever as a child 5. H/o possible TIA     --carotid u/s 06/2007; 1-39% B 6. Paroxysmal. atrial fibrillation      --ETT nomral      --echo EF 55%-60% moderate MR         Depression Melenoma  Review of Systems       As per HPI and past medical history; otherwise all systems negative.    Vital Signs:  Patient profile:   77 year old female Height:      65 inches Weight:      147.25 pounds BMI:     24.59 Pulse rate:   80 / minute BP sitting:   114 / 60  (left arm) Cuff size:   regular  Vitals Entered By: Caralee Ates CMA (February 09, 2010 10:24 AM)  Physical Exam  General:   Elderly no distress. HEENT: normal Neck: supple. no JVD. Carotids 2+ bilat; no bruits. No lymphadenopathy or thryomegaly appreciated. Cor: PMI nondisplaced. Huston Foley and regular. No rubs, gallops, murmur. Lungs: clear Abdomen: soft, nontender, nondistended.  Good bowel sounds. Extremities: no cyanosis, clubbing, rash, edema Neuro: alert & orientedx3, cranial nerves grossly intact. FTN ok. No drift. moves all 4 extremities w/o difficulty. affect pleasant   Impression & Recommendations:  Problem # 1:  ATRIAL FIBRILLATION, PAROXYSMAL (ICD-427.31) Much improved. Maintaining SR. I have asked her to check her medicines at home and make sure she is taking amiodarone. If not, will restart. Return  in 3 months with amio surveillance: CXR, CMET, thyroid panel. Can consider dropping amio to 100qd at that visit. Continue coumadin.   Patient Instructions: 1)  Your physician recommends that you schedule a follow-up appointment in: 3- 4 months. 2)  Labwork in 3- 4 months: tsh/Free T4/ bmet/liver (427.31). 3)  Chest X-ray in 3- 4 months.

## 2010-05-05 NOTE — Assessment & Plan Note (Signed)
Summary: [facility]   Primary Provider:  Creola Corn, MD   History of Present Illness: Patient here for tredmill test to r/o proarrhytmia with flecainide. See A/P for details.  Allergies: No Known Drug Allergies   Impression & Recommendations:  Problem # 1:  ATRIAL FIBRILLATION, PAROXYSMAL (ICD-427.31)  Ms. Alessandrini came today for GXT to assess for proarrhytmia after starting Flecainide. Unfortunately pharmacy misunderstood our order and only gave her 6 pills so she is no longer taking. Thus will reschedule GXT for when she is on Flecainide. She also mentioned that she ran out of Coumadin last week and hasn't restarted. I stressed to her the need to be compliant with all meds as her risk of stroke is not insignificant. I discussed with [name] who will also f/u with her and have their Coumadin clinic contact her. Was mildly bradycardic today (HR 60) so will decrease Toprol to 25 to avoid severe brady with Flecainide.  Orders: Treadmill (Treadmill)  Patient Instructions: 1)  Decrease Metoprolol to 25mg  daily 2)  Restart Flecanide 3)  Restart Coumadin, Dr Timothy Lasso office is aware  4)  Your physician has requested that you have an exercise tolerance test.  For further information please visit [email or web address]  Please also follow instruction sheet, as given. Prescriptions: FLECAINIDE ACETATE 50 MG TABS (FLECAINIDE ACETATE) Take one tablet by mouth every 12 hours  #180 x 3   Entered by:   Meredith Staggers, RN   Authorized by:   Dolores Patty, MD, [facility]   Signed by:   Meredith Staggers, RN on 09/05/2009   Method used:   Electronically to        [facility]* (mail-order)             ,          Ph: [phone]       Fax: [phone]   RxID:   [id number]

## 2010-05-05 NOTE — Progress Notes (Signed)
Summary: coumadin dna  Phone Note From Pharmacy   Caller: [facility] [phone] ref # [id number] Request: Speak with Nurse Summary of Call: coumadin dna.  Initial call taken by: Lorne Skeens,  July 14, 2009 1:58 PM  Follow-up for Phone Call        spoke w/[facility] they faxed order for coumadin dna if md wants done can sign and fax back to them Meredith Staggers, RN  July 14, 2009 2:45 PM      Appended Document: coumadin dna received form we did not order test

## 2010-05-05 NOTE — Progress Notes (Signed)
Summary: monitor results  Phone Note Outgoing Call   Call placed by: Meredith Staggers, RN,  October 23, 2009 3:02 PM Call placed to: Patient Summary of Call: called pt w/monitor results, SR w/occ PACs/PVCs and occ. brief <10 beat runs of SVT per Dr Gala Romney, pt is aware

## 2010-05-05 NOTE — Assessment & Plan Note (Signed)
Summary: PER CHECK OUT/SF   Visit Type:  Follow-up Primary Provider:  Creola Corn, MD  CC:  shortness of breath.  History of Present Illness: Nancy [name] is an 77 y/o healthy woman with a history of hyperlipidemia, migraines and possible TIA. We saw her for the first time earlier this year for an episode of palpitations and CP.   F/u montior revealed multiple episdoes of atrial fibrillation. Started on metoprolol and coumadin.  Says she feels fine. Working in yard actively. Occasionally has to stop working due to dyspnea and rest and then starts back up. However she minimizes this and says it is just due to her work. She tells me she doesnt feel any different from several months ago. Denies palpitations.   Had ETT which was normal and echo EF 55-60%. moderate MR.   Current Medications (verified): 1)  Multivitamins   Tabs (Multiple Vitamin) .Marland Kitchen.. 1 By Mouth Daily 2)  Oscal 500/200 D-3 500-200 Mg-Unit Tabs (Calcium-Vitamin D) .Marland Kitchen.. 1 By Mouth Daily 3)  Vitamin B Complex-C   Caps (B Complex-C) .Marland Kitchen.. 1 By Mouth Daily 4)  Biotin .Marland Kitchen.. 1 By Mouth Daily 5)  Aspirin 81 Mg Tbec (Aspirin) .... Take One Tablet By Mouth Daily 6)  Metoprolol Succinate 50 Mg Xr24h-Tab (Metoprolol Succinate) .... Take One Tablet By Mouth Daily 7)  Coumadin 3 Mg Tabs (Warfarin Sodium) .... Once Daily As Directed 8)  Simvastatin 20 Mg Tabs (Simvastatin) .... Once Daily 9)  Ginkoba 40 Mg Tabs (Ginkgo Biloba) .... Once Daily 10)  Bentyl 10 Mg Caps (Dicyclomine Hcl) .... Take 1 Capsule By Mouth Two Times A Day As Needed Frequent Bowel Movements  Allergies (verified): No Known Drug Allergies  Past History:  Past Medical History: 1. Migraines 2. GERD 3. Dyslipidemia 4. Scarlet fever as a child 5. H/o possible TIA     --carotid u/s 06/2007; 1-39% B 6. Paroxysmal. atrial fibrillation      --ETT nomral      --echo EF 55%-60% moderate MR         Depression Melenoma  Review of Systems       As per HPI and past medical  history; otherwise all systems negative.   Vital Signs:  Patient profile:   77 year old female Height:      65 inches Weight:      149 pounds BMI:     24.88 Pulse rate:   83 / minute BP sitting:   116 / 60  (left arm) Cuff size:   regular  Vitals Entered By: Hardin Negus, RMA (Aug 26, 2009 1:48 PM)  Physical Exam  General:   Elderly but fit appearing. no distress. HEENT: normal Neck: supple. no JVD. Carotids 2+ bilat; no bruits. No lymphadenopathy or thryomegaly appreciated. Cor: PMI nondisplaced. mildy irregular rate & rhythm. No rubs, gallops, murmur. Lungs: clear Abdomen: soft, nontender, nondistended.  Good bowel sounds. Extremities: no cyanosis, clubbing, rash, edema Neuro: alert & orientedx3, cranial nerves grossly intact. FTN ok. No drift. moves all 4 extremities w/o difficulty. affect pleasant   Impression & Recommendations:  Problem # 1:  ATRIAL FIBRILLATION (ICD-427.31) She is a tough historian. After a long talk with her I think she is much more symptomatic than she is leading on and based on her ECG I think this is likely related to freuqent bursts of AF. Will start low-dose flecainide 50 two times a day (may have to titrate in future) Repeat treadmill in 1-2 weeks to rule out proarrhtymia.   Other  Orders: EKG w/ Interpretation (93000) Treadmill (Treadmill)  Patient Instructions: 1)  Your physician has requested that you have an exercise tolerance test.  For further information please visit [email or web address]  Please also follow instruction sheet, as given. 2)  Follow up in 3 months Prescriptions: FLECAINIDE ACETATE 50 MG TABS (FLECAINIDE ACETATE) Take one tablet by mouth every 12 hours  #60 x 6   Entered by:   Meredith Staggers, RN   Authorized by:   Dolores Patty, MD, [facility]   Signed by:   Meredith Staggers, RN on 08/26/2009   Method used:   Electronically to        [facility]  [address] [phone]* (retail)       [address]       [place], [place]  [address]       Ph: [phone] or [phone]       Fax: [phone]   RxID:   [id number]

## 2010-05-05 NOTE — Progress Notes (Signed)
Summary: test results  Phone Note Outgoing Call   Call placed by: Meredith Staggers, RN,  July 28, 2009 5:52 PM Call placed to: Patient Summary of Call: called pt w/test results (carotids, echo, and monitor).  Per Dr Gala Romney monitor showed SR since starting pt on toprol 3/31, Left message to call back   Follow-up for Phone Call        returning call, Migdalia Dk  July 29, 2009 3:00 PM  [facility] Katina Dung, RN, BSN  July 29, 2009 3:07 PM discussed test results with patient by telephone--echo/carotid/monitor

## 2010-05-07 NOTE — Progress Notes (Signed)
Summary: question re xray orders  Phone Note Call from Patient Call back at Home Phone [phone]   Caller: Patient Reason for Call: Talk to Nurse Summary of Call: pt has question re a order for xray. pt states doctor wants her to have and xay but she does not know where she needs to go. Initial call taken by: Roe Coombs,  April 17, 2010 10:31 AM  Follow-up for Phone Call        Phone Call Completed PT AWARE WILL HAVE CXR AND LABS DONE AT [facility] ON June 08 1010 AT 10:00 AM  Follow-up by: Scherrie Bateman, LPN,  April 17, 2010 10:46 AM

## 2010-05-07 NOTE — Miscellaneous (Signed)
  Clinical Lists Changes  Problems: Added new problem of LONG-TERM (CURRENT) USE OF OTHER MEDICATIONS (ICD-V58.69) Orders: Added new Test order of T-2 View CXR (71020TC) - Signed

## 2010-05-27 NOTE — Progress Notes (Signed)
Summary: [facility]: Office Visit  [facility]: Office Visit   Imported By: Earl Many 05/18/2010 11:16:43  _____________________________________________________________________  External Attachment:    Type:   Image     Comment:   External Document

## 2010-06-08 ENCOUNTER — Encounter (INDEPENDENT_AMBULATORY_CARE_PROVIDER_SITE_OTHER): Payer: Self-pay | Admitting: *Deleted

## 2010-06-08 ENCOUNTER — Other Ambulatory Visit: Payer: Self-pay | Admitting: Internal Medicine

## 2010-06-08 ENCOUNTER — Other Ambulatory Visit: Payer: Medicare Other

## 2010-06-08 DIAGNOSIS — J449 Chronic obstructive pulmonary disease, unspecified: Secondary | ICD-10-CM

## 2010-06-08 DIAGNOSIS — E785 Hyperlipidemia, unspecified: Secondary | ICD-10-CM

## 2010-06-08 LAB — BASIC METABOLIC PANEL
BUN: 13 mg/dL (ref 6–23)
Chloride: 99 mEq/L (ref 96–112)
GFR: 52.11 mL/min — ABNORMAL LOW (ref >60.00)
Potassium: 4.5 mEq/L (ref 3.5–5.1)
Sodium: 139 mEq/L (ref 135–145)

## 2010-06-08 LAB — T4, FREE: Free T4: 1.87 ng/dL — ABNORMAL HIGH (ref 0.60–1.60)

## 2010-06-08 LAB — HEPATIC FUNCTION PANEL
AST: 32 U/L (ref 0–37)
Albumin: 3.7 g/dL (ref 3.5–5.2)
Alkaline Phosphatase: 62 U/L (ref 39–117)
Total Bilirubin: 0.6 mg/dL (ref 0.3–1.2)
Total Protein: 7.2 g/dL (ref 6.0–8.3)

## 2010-06-11 ENCOUNTER — Encounter (INDEPENDENT_AMBULATORY_CARE_PROVIDER_SITE_OTHER): Payer: Self-pay | Admitting: *Deleted

## 2010-06-12 ENCOUNTER — Ambulatory Visit: Payer: Self-pay | Admitting: Internal Medicine

## 2010-06-16 NOTE — Letter (Signed)
Summary: Appointment - Reschedule  [facility], Main Office  [address]   [place], [place] [address]   Phone: [phone]  Fax: [phone]     June 11, 2010 MRN: [id number]   [facility] [address] [place], [place]  [address]   Dear Nancy [name],   Due to a change in our office schedule, your appointment on  March 29,2012 at 2:45 must be changed.  It is very important that we reach you to reschedule this appointment. We look forward to participating in your health care needs. Please contact us at the number listed above at your earliest convenience to reschedule this appointment.     Sincerely, Pension scheme manager

## 2010-06-20 ENCOUNTER — Encounter: Payer: Self-pay | Admitting: Internal Medicine

## 2010-07-02 ENCOUNTER — Ambulatory Visit: Payer: Self-pay | Admitting: Internal Medicine

## 2010-07-14 ENCOUNTER — Ambulatory Visit: Payer: Self-pay | Admitting: Internal Medicine

## 2010-07-14 ENCOUNTER — Encounter: Payer: Self-pay | Admitting: Internal Medicine

## 2010-07-14 ENCOUNTER — Ambulatory Visit (INDEPENDENT_AMBULATORY_CARE_PROVIDER_SITE_OTHER): Payer: Medicare Other | Admitting: Internal Medicine

## 2010-07-14 VITALS — BP 142/70 | HR 60 | Ht 66.0 in | Wt 143.0 lb

## 2010-07-14 DIAGNOSIS — I4891 Unspecified atrial fibrillation: Secondary | ICD-10-CM

## 2010-07-14 MED ORDER — AMIODARONE HCL 200 MG PO TABS: ORAL_TABLET | ORAL | Status: DC

## 2010-07-14 NOTE — Assessment & Plan Note (Signed)
Doing very well on amiodarone and Xarelto. Will decrease amio to 100qd in an attempt to limit risk of long-term adverse effects. If palpitations recur will need to go back to 200qd. Given mildly elevated T4 will recheck full thyroid panel in 4 weeks.

## 2010-07-14 NOTE — Progress Notes (Signed)
HPI:  Nancy [name] is an 77 y/o woman with a history of hyperlipidemia, migraines, possible TIA and PAF.  Started on metoprolol and coumadin. Had ETT which was normal and echo EF 55-60%. moderate MR.  F/u montior revealed multiple episdoes of atrial fibrillation with RVR so started on Flecainide. On 6/21, had repeat ETT to exclude proarrhytmia with Flecainide. At start of test patient was in sinus rhythm. During early exercise she developed brief salvos of SVT. Then she developed sustained SVT/atrial flutter with rates up to 180 with signifcant ST depression. This was asymptomatic. With vagal maneuvers SVT was broken and then went into atiral fib for aschort period of time and then back into sinus rhythm. Flecainide stopped and amiodarone started.  In August 2011 was feeling fatigued so amio cut down to 200 once daily.  Returns today for f/u. Says she is feeling is very good. No palpitations. Remains fairly active -back to playing tennis.  Switched from coumadin to Xarelto (rivoroxaban) due difficulty controlling INRs. No bleeding with Xarelto.   Recently had CXR and labs. Labs normal except for minimally elevated T4. CXR normal. LFTs ok.   ROS: All systems negative except as listed in HPI, PMH and Problem List.  Past Medical History  Diagnosis Date  . Migraine   . GERD (gastroesophageal reflux disease)   . Dyslipidemia   . History of scarlet fever   . PAF (paroxysmal atrial fibrillation)   . Depression   . Melanoma     Current Outpatient Prescriptions  Medication Sig Dispense Refill  . amiodarone (PACERONE) 200 MG tablet Take 200 mg by mouth daily.        Marland Kitchen aspirin 81 MG tablet Take 81 mg by mouth daily.        . Biotin 5000 MCG CAPS Take by mouth daily.        . calcium-vitamin D (OSCAL WITH D 500-200) 500-200 MG-UNIT per tablet Take 1 tablet by mouth daily.        . fish oil-omega-3 fatty acids 1000 MG capsule Take 1 g by mouth daily.        . Ginkgo Biloba (GINKOBA) 40 MG TABS Take by  mouth daily.        Marland Kitchen pyridOXINE (VITAMIN B-6) 100 MG tablet Take 100 mg by mouth daily.        . simvastatin (ZOCOR) 20 MG tablet Take 1 tablet by mouth daily.      Carlena Hurl 20 MG TABS Take 1 tablet by mouth daily.      Marland Kitchen dicyclomine (BENTYL) 10 MG capsule Take 10 mg by mouth as needed.        . Multiple Vitamin (MULTIVITAMIN) tablet Take 1 tablet by mouth daily.        Marland Kitchen warfarin (COUMADIN) 3 MG tablet Take 3 mg by mouth as directed.           PHYSICAL EXAM: Filed Vitals:   07/14/10 1415  BP: 142/70  Pulse: 60  General: Elderly no distress. HEENT: normal Neck: supple. no JVD. Carotids 2+ bilat; no bruits. No lymphadenopathy or thryomegaly appreciated. Cor: PMI nondisplaced. Regular. No rubs, gallops, murmur. Lungs: clear Abdomen: soft, nontender, nondistended.  Good bowel sounds. Extremities: no cyanosis, clubbing, rash, edema Neuro: alert & orientedx3, cranial nerves grossly intact.affect pleasant   ECG: NSR 60 No ST-T wave abnormalities.     ASSESSMENT & PLAN:

## 2010-07-14 NOTE — Patient Instructions (Signed)
Decrease Amiodarone to 100mg  daily (1/2 tab) Your physician recommends that you return for lab work in: 4 weeks (tsh, free t4, free t3 427.31 [id number]) Your physician wants you to follow-up in: 6 months. You will receive a reminder letter in the mail two months in advance. If you don't receive a letter, please call our office to schedule the follow-up appointment.

## 2010-07-15 ENCOUNTER — Encounter: Payer: Self-pay | Admitting: Internal Medicine

## 2010-08-10 ENCOUNTER — Other Ambulatory Visit: Payer: Medicare Other | Admitting: *Deleted

## 2010-08-18 NOTE — Procedures (Signed)
CAROTID DUPLEX EXAM   INDICATION:  Questionable CVA/TIA.   HISTORY:  Diabetes:  No.  Cardiac:  No.  Hypertension:  No.  Smoking:  Quit in 1969.  Previous Surgery:  None.  CV History:  Difficulty in speech (dysphagia).  Amaurosis Fugax No, Paresthesias No, Hemiparesis No                                       RIGHT             LEFT  Brachial systolic pressure:         130               120  Brachial Doppler waveforms:         Biphasic          Biphasic  Vertebral direction of flow:        Antegrade         Antegrade  DUPLEX VELOCITIES (cm/sec)  CCA peak systolic                   70                93  ECA peak systolic                   10                74  ICA peak systolic                   73                59  ICA end diastolic                   22                18  PLAQUE MORPHOLOGY:                  Heterogenous      Heterogenous  PLAQUE AMOUNT:                      Mild              Mild  PLAQUE LOCATION:                    ICA               ICA   IMPRESSION:  1. 1-39% stenosis noted in bilateral internal carotid arteries.  2. Antegrade bilateral vertebral arteries.       ___________________________________________  P. Liliane Bade, M.D.   MG/MEDQ  D:  06/06/2007  T:  06/06/2007  Job:  [id number]

## 2010-08-21 NOTE — H&P (Signed)
Nancy [name], Nancy [name]                 ACCOUNT NO.:  [id number]   MEDICAL RECORD NO.:  [id number]          PATIENT TYPE:  INP   LOCATION:  3008                         FACILITY:  [facility]   PHYSICIAN:  Casimiro Needle L. Reynolds, M.D.DATE OF BIRTH:  [birth year]   DATE OF ADMISSION:  02/16/2006  DATE OF DISCHARGE:                                HISTORY & PHYSICAL   CHIEF COMPLAINT:  Speech disturbance, possible transient ischemic attack.   HISTORY OF PRESENT ILLNESS:  This is the initial Stroke Service admission  for this 77 year old woman with little past medical history.  The patient  was playing bridge with friends today when at about 1 p.m. she noted the  disturbance in her speech.  She described this as a slowness and a trouble  with word finding as opposed to a slurring.  She says that her friends  noticed that her speech was slurred and not exactly right.  This lasted  about 10 or 15 minutes and then resolved.  She also had a very brief episode  of numbness of the right hand associated with this.  This lasted no more  than one minute and subsequently resolved.  With this episodes, she had a  dull frontal headache which she says still persists.  She took some Aleve  and aspirin and then came to the emergency room for evaluation.  She denies  a history of previous similar symptoms but does say that she has had  migraines in the past and associated with that has had a transient hemi  numbness as well as hesitancy of speech.  However, she has not had any new  symptoms for many years.   PAST MEDICAL HISTORY:  Remarkable for migraines as above.  She denies any  history of hypertension, diabetes, hypercholesterolemia, or coronary artery  disease.  She had scarlet fever as a child and says that she was on a  digitalis for a little while after that but has not taken that for many  years.   FAMILY HISTORY:  Negative for stroke.   SOCIAL HISTORY:  She lives with her husband who has dementia, and her  son is  also helping her to look after him.  She is normally independent in her  activities of daily living.  She says she has a very remote history of  tobacco use.   ALLERGIES:  No known drug allergies.   MEDICATIONS:  Nexium and Zoloft.   REVIEW OF SYSTEMS:  She reports a little bit of nonspecific dizziness over  the last couple of weeks but just been intermittent.  Otherwise, full ten-  system review of systems is negative except as outlined in the HPI and in  the admission nursing record.   PHYSICAL EXAMINATION:  VITAL SIGNS:  Temperature 97.0, blood pressure  172/77, pulse 69, respirations 20.  This is a healthy-appearing woman,  supine in the hospital bed in no evident distress.  HEAD:  Cranium is normocephalic and atraumatic.  Oropharynx is benign.  NECK:  Supple without carotid or supraclavicular bruits.  HEART:  Regular rate and rhythm without murmurs.  CHEST:  Clear to auscultation bilaterally.  ABDOMEN:  Soft, normoactive bowel sounds, no hepatomegaly.  EXTREMITIES:  Trace edema, 2+ pulses.  NEUROLOGIC:  Mental status.  She is awake and alert.  She is fully oriented  to time, place, and person.  Recent and remote memory are intact.  Attention  span, concentration, and fund of knowledge are all adequate for history  giving.  Speech is fluent and not dysarthric.  She has no defects or  confrontational naming and can repeat a phrase.  Cranial Nerves:  Pupils are  equal and reactive.  Extraocular movements are full without nystagmus.  Visual fields are full to confrontation.  Hearing is intact to  conversational speech.  Facial sensation is intact to pinprick.  Face  tender, palate moves normally and symmetrically.  Motor:  Normal bulk and  tone.  Normal strength.  No tense extremity muscles.  Sensation intact to  light touch and pinprick in all extremities.  Coordination:  Rapid movements  are full and well.  Finger-to-nose, heel-to-shin are full and well.  Gait is   deferred.  Reflexes 2+ and symmetric.  Toes are downgoing bilaterally.   LABORATORY REVIEW:  CBC:  White count 8.2, hemoglobin 13.4, platelets  371,000.  BMET is unremarkable except for a minimally elevated glucose of  104.  Coagulants are normal.  Urinalysis is normal.  CT of the head reveals  mild small vessel disease and no acute findings.   IMPRESSION:  Transient speech disturbance and right-sided numbness, probable  transients ischemic attack versus possible migraine phenomenon.   PLAN:  Will admit for a brief TIA workup including MRI and MRA.  MRI with  MRA of the brain, carotid, and transcranial Dopplers, 2D echocardiogram and  stroke labs.  Disposition pending results of the above.      Michael L. Thad Ranger, M.D.  Electronically Signed     MLR/MEDQ  D:  02/16/2006  T:  02/17/2006  Job:  [id number]   cc:   Gwen Pounds, MD

## 2010-08-21 NOTE — Discharge Summary (Signed)
NAMEJina, [name]                 ACCOUNT NO.:  [id number]   MEDICAL RECORD NO.:  [id number]          PATIENT TYPE:  INP   LOCATION:  3008                         FACILITY:  [facility]   PHYSICIAN:  Pramod P. Pearlean Brownie, MD    DATE OF BIRTH:  06-24-1925   DATE OF ADMISSION:  02/16/2006  DATE OF DISCHARGE:  02/17/2006                                 DISCHARGE SUMMARY   DIAGNOSES AT TIME OF DISCHARGE:  1. Complicated migraine.  2. Scarlet fever as a child.  3. Esophageal reflux disease.  4. Dyslipidemia.   MEDICINES AT TIME OF DISCHARGE:  1. Aspirin 325 mg a day.  2. Nexium 40 mg a day.   STUDIES PERFORMED.:  1. CT of the brain on admission showed mild chronic small vessel disease.      No acute findings.  2. Next MRI of the brain shows no acute abnormality.  Mild chronic small      vessel changes in the pons and hemispheric white matter.  3. MRA of the head showed intracranial atherosclerotic chronic disease,      most pronounced affecting the vertebrobasilar vessels are patent.  The      patient also has anterior circulation atherosclerotic disease,      particularly in the right anterior cerebral artery.  4. Carotid Doppler shows no ICA stenosis.  5. A 2-D echocardiogram performed, results pending.  6. Transcranial Doppler performed, results pending.   LABORATORY STUDIES:  CBC normal.  Differential normal.  Chemistry with  glucose 100, otherwise normal.  Coagulation studies normal.  Liver function  tests normal.  Cholesterol 229, LDL 161, HDL 51 and triglycerides 85.  Cardiac enzymes with CK-MB 4.2 upper limit 4.  Urinalysis showed 0-2 white  blood cells, 0-2 red blood cells and trace leukocyte esterase, homocystine  is pending.  Urine drug screen is pending.  Alcohol level less than point  less than 0.5.  Hemoglobin A1c 6.2.   HISTORY OF PRESENT ILLNESS:  [name] is a 77 year old Caucasian female  with no past medical history who was playing bridge with her friends today  at  1:00 p.m. and noted speech disturbance, trouble with word finding, and  was noted to be slow by her friends.  This lasted 10-50 minutes then  resolved.  She also had a transient right hand numbness x1 minute, no  weakness and then resolved.  Afterwards, she had a dull frontal headache  which persisted.  She took aspirin and came to the emergency room.  She has  no history of previous stroke but has had numbness and speech hesitancy in  the past with ongoing migraines years ago.   HOSPITAL COURSE:  MRI was negative for acute infarct.  Stroke workup  revealed dyslipidemia, otherwise no significant risk factors other than age.  It was thought she had a complicated migraine.  Her aspirin was increased  from 81 mg a day to 325 mg a day.  She has been asked to follow up Dr.  Thad Ranger related to headache after discharge.  She will be discharged home  as she cares for  her elderly, demented husband.   CONDITION AT DISCHARGE:  The patient alert and oriented x3.  No cranial  nerve deficits.  No focal deficits.  No limb weakness.  HEART:  Rate regular.  CHEST: Clear.   DISCHARGE/PLAN:  1. Discharge home.  2. Increase aspirin from 81 mg to 325 mg daily.  3. Consider statin for dyslipidemia if she can tolerate  4. Follow up with Dr. Kelli Hope to 1-2 months for migraine      followup.      Annie Main, N.P.    ______________________________  Sunny Schlein. Pearlean Brownie, MD    SB/MEDQ  D:  02/17/2006  T:  02/17/2006  Job:  [id number]   cc:   Casimiro Needle L. Thad Ranger, M.D.  Gwen Pounds, MD

## 2010-08-21 NOTE — Assessment & Plan Note (Signed)
[facility]                               PULMONARY OFFICE NOTE   NAME:[name], [name]                        MRN:          [id number]  DATE:02/03/2006                            DOB:          [birth year]    PULMONARY EXTENDED SUMMARY FINAL FOLLOW UP:   HISTORY:  A 77 year old white female with chronic cough dating over 25 years  who previously responded 100% to treatment directed at GERD and  subsequently was seen by Dr. Pollyann Kennedy who strongly suspected that she had LPRs  the cause of her frequent throat clearing.   Her cough reoccurred off of acid reflux medications associated with  sensation of hoarsness and throat clearing, it was more of a daytime than a  night time problem and therefore again suggested to me LPR/GERD.  Extra  esophageal GERD is a mechanism when I saw her on August 29.  I spent extra  time with the patient on that day going over line by line a set of  instructions, for which I kept a copy on the chart and showed her today, but  she did not recognize them.  Specifically she was to start Nexium b.i.d. 30  minutes before meals, 40 mg b.i.d. and also add metoclopramide before meals  and at bedtime.  Take Delsyn 2 teaspoons b.i.d. to stop throat clearing.  If  needed supplement with Ultram.   The only instruction that it appears she followed was to take  metoclopramide, which she remembers most of the time.  However, she  reports being at least 50% better.  Her problem is more of a daytime than a  night time problem.  She wakes up and stirs around in the morning and maybe  coughs up a teaspoon of mucus once she has finished getting dressed, but  has no nocturnal disturbance or significant daytime dyspnea, chest pain,  fevers, sweats or chills.   PHYSICAL EXAMINATION:  She is a pleasant white female in no acute distress.  She is afebrile, stable vital signs.  HEENT:  Unremarkable.  Oropharynx is clear with no excessive postnasal  drainage. Nose normal.  Ear canals clear bilaterally.  LUNGS:  Clear bilaterally with no __________.  ABDOMEN:  Soft, benign.  EXTREMITIES:  Without calf tenderness, cyanosis, clubbing or edema.   IMPRESSION:  Cough that has again responded to treatment directed at reflux,  although certainly the fact that she is not 100% better since treatment in  August is at least partly related in non adherents to a relatively simple  regimen.  Before I would consider additional workup (which might include  sinus a CT scan and/or medical __________ to cover the differential  diagnosis more broadly (I would recommend she follow the instructions she  was given both by me and Dr. Pollyann Kennedy as above).  I therefore spent most of my  time going line by line over the previous instructions for which I had a  copy in the chart, but I am skeptical that she will actually be able to  follow this.  If she is not happy though,  the next step would be to do a  sinus CT scan  and then return to our nurse practitioner for full medication reconciliation  and only then consider additional pulmonary evaluation.    ______________________________  Charlaine Dalton Sherene Sires, MD, [facility]    MBW/MedQ  DD: 02/03/2006  DT: 02/03/2006  Job #: [id number]   cc:   Shawnie Dapper, M.D.

## 2010-08-21 NOTE — Assessment & Plan Note (Signed)
[facility]                               PULMONARY OFFICE NOTE   NAME:[name], [name]                        MRN:          [id number]  DATE:12/01/2005                            DOB:          May 20, 1925    HISTORY OF PRESENT ILLNESS:  A 77 year old white female, remote smoker, last  seen here in 2005 with a cough dating back 25 years, that was a classic  cyclical cough, for which I thought reflux might be an issue. I treated her  empirically with high dose Nexium and Reglan along with short courses of  Ultram, to eliminate the cycle of coughing and she never returned. She says  the reason that she did not come back is she was completely better. However,  over the last year (long after she discontinued therapy, although she is not  sure how long) she developed the same pattern of cough except now, she is  getting some mucous up in the morning. What she does get up only occurs  after she stirs around and is white in nature. She denies any overt reflux  or sinus symptoms.   PAST MEDICAL HISTORY:  Significant for only very mild COPD by previous PFTs  documented here with status post smoking cessation in the 1940's. She also  has a history of remote appendectomy, tonsillectomy, and Scarlet fever as a  child.   ALLERGIES:  She denies any aspirin intolerance or other allergies.   MEDICATIONS:  Taken in detail and no longer include biphosphonate's but do  include Flax seed and fish oil.   SOCIAL HISTORY:  She quit smoking 35 years ago. Denies any unusual travel,  pet, or hobby exposure.   FAMILY HISTORY:  Is recorded in detail and significant for the absence of  atypia or respiratory disease.   REVIEW OF SYSTEMS:  Taken in detail and significant for the problems as  outlined above.   PHYSICAL EXAMINATION:  GENERAL:  This is a pleasant ambulatory white female  who clears her throat frequently during the examination.  HEENT:  Remarkable for the fact  that her turbinates are perfectly normal.  Oropharynx is clear with no evidence of excessive post-nasal drip.  NECK:  Supple without cervical adenopathy or tenderness. Trachea is midline.  LUNGS:  Fields perfectly clear bilaterally to auscultation and percussion  with no cough elicited on inspiratory or expiratory maneuvers.  HEART:  Regular rate and rhythm. Without murmur, rub, or gallop.  ABDOMEN:  Soft, benign.  EXTREMITIES:  No calf tenderness, cyanosis, clubbing or edema.   LABORATORY DATA:  Oxygen saturation 97% on room air.   Chest x-ray is pending.   IMPRESSION:  Functional/cyclical cough that dates back over 25 years now and  previously appears to have responded to the combination of Nexium and  Reglan. I am concerned about the use of so many oils in the diet that she  may be having a non-acid reflux issue now as well but I am assuming that  this is all acid reflux until proven otherwise. Recommended re-challenging  her with Nexium 40 mg  b.i.d. before meals for the next [redacted] weeks along with  Reglan 10 mg before meals and at bedtime. If this fails to control the  cough, the next step would be to eliminate oils from the diet, proceed with  a sinus CT scan and a methacholine challenge test.   I was also amazed by the history that after 25 years of coughing and being  seen here in May of 2005, she had a rather ah shucks response to the  question of whether or not we actually helped her (it turns out in  retrospect that the cough was totally eliminated for more than a year after  she was seen here and yet she did not appear to immediately recognize the  benefit of the therapy that she was given and also did not seem to impress  her when she stopped the medicine, that the symptoms came back.) I suspect  that that is because there is such a delay between the change in therapy and  the affect of the change in patient's who have chronic reflux. So that is  difficult to put cause and  affect together, either when medicines are  started or stopped. I explained all of this to the patient and emphasized  the importance of followup. If it turns out that every time the medicines  are stopped and she refluxes, I would recommend strongly, a GI evaluation  for other secondary complications of reflux such as stricture and Barrett's.                                   Charlaine Dalton. Sherene Sires, MD, [facility]   MBW/MedQ  DD:  12/01/2005  DT:  12/01/2005  Job #:  [id number]   cc:   Gwen Pounds, MD

## 2010-09-02 ENCOUNTER — Encounter: Payer: Self-pay | Admitting: Internal Medicine

## 2010-09-02 ENCOUNTER — Ambulatory Visit (INDEPENDENT_AMBULATORY_CARE_PROVIDER_SITE_OTHER): Payer: Medicare Other | Admitting: Internal Medicine

## 2010-09-02 VITALS — BP 140/80 | HR 80 | Resp 18 | Ht 65.0 in | Wt 144.0 lb

## 2010-09-02 DIAGNOSIS — I4891 Unspecified atrial fibrillation: Secondary | ICD-10-CM

## 2010-09-02 MED ORDER — AMIODARONE HCL 200 MG PO TABS: 200.0000 mg | ORAL_TABLET | Freq: Every day | ORAL | Status: DC

## 2010-09-02 MED ORDER — RIVAROXABAN 20 MG PO TABS: 1.0000 | ORAL_TABLET | Freq: Every day | ORAL | Status: DC

## 2010-09-02 NOTE — Progress Notes (Signed)
HPI:  Nancy [name] is an 77 y/o woman with a history of hyperlipidemia, migraines, possible TIA and PAF.  Started on metoprolol and coumadin. Had ETT which was normal and echo EF 55-60%. moderate MR.  F/u montior revealed multiple episdoes of atrial fibrillation with RVR so started on Flecainide. On 6/21, had repeat ETT to exclude proarrhytmia with Flecainide. At start of test patient was in sinus rhythm. During early exercise she developed brief salvos of SVT. Then she developed sustained SVT/atrial flutter with rates up to 180 with signifcant ST depression. This was asymptomatic. With vagal maneuvers SVT was broken and then went into atiral fib for aschort period of time and then back into sinus rhythm. Flecainide stopped and amiodarone started.  In August 2011 was feeling fatigued so amio cut down to 200 once daily. In April 2012 was maintaining SR and amio decreased to 100 daily.  Returns today for f/u. Says she is feeling is very good. No palpitations. Remains fairly active -back to playing tennis and gardening.  Doing well with Xarelto (rivoroxaban) no bleeding.   ROS: All systems negative except as listed in HPI, PMH and Problem List.  Past Medical History  Diagnosis Date  . Migraine   . GERD (gastroesophageal reflux disease)   . Dyslipidemia   . History of scarlet fever   . PAF (paroxysmal atrial fibrillation)   . Depression   . Melanoma     Current Outpatient Prescriptions  Medication Sig Dispense Refill  . amiodarone (PACERONE) 200 MG tablet Take 1/2 tab daily       . aspirin 81 MG tablet Take 81 mg by mouth daily.        . Biotin 5000 MCG CAPS Take by mouth daily.        . calcium-vitamin D (OSCAL WITH D 500-200) 500-200 MG-UNIT per tablet Take 1 tablet by mouth daily.        Marland Kitchen dicyclomine (BENTYL) 10 MG capsule Take 10 mg by mouth as needed.        . fish oil-omega-3 fatty acids 1000 MG capsule Take 1 g by mouth daily.        . Ginkgo Biloba (GINKOBA) 40 MG TABS Take by mouth  daily.        . Multiple Vitamin (MULTIVITAMIN) tablet Take 1 tablet by mouth daily.        Marland Kitchen pyridOXINE (VITAMIN B-6) 100 MG tablet Take 100 mg by mouth daily.        . simvastatin (ZOCOR) 20 MG tablet Take 1 tablet by mouth daily.      Carlena Hurl 20 MG TABS Take 1 tablet by mouth daily.      Marland Kitchen DISCONTD: warfarin (COUMADIN) 3 MG tablet Take 3 mg by mouth as directed.           PHYSICAL EXAM: Filed Vitals:   09/02/10 1104  BP: 140/80  Pulse: 80  Resp: 18  General: Elderly no distress. HEENT: normal Neck: supple. no JVD. Carotids 2+ bilat; no bruits. No lymphadenopathy or thryomegaly appreciated. Cor: PMI nondisplaced. Regular. No rubs, gallops, murmur. Lungs: clear Abdomen: soft, nontender, nondistended.  Good bowel sounds. Extremities: no cyanosis, clubbing, rash, edema Neuro: alert & orientedx3, cranial nerves grossly intact.affect pleasant   ECG: AF 74 No ST-T wave abnormalities.     ASSESSMENT & PLAN:

## 2010-09-02 NOTE — Patient Instructions (Signed)
Increase Amio to 200 mg daily Your physician wants you to follow-up in: 4 months.  You will receive a reminder letter in the mail two months in advance. If you don't receive a letter, please call our office to schedule the follow-up appointment.

## 2010-09-02 NOTE — Assessment & Plan Note (Addendum)
On ECG she is back in AF after decreasing amio dose to 100qd. However on exam seems to be in sinus. Will repeat ECG.  Overall relatively asymptomatic. Will increase amio back to 200 qd.  May need holter at some point to assess for burden of AF. If still in AF may need to consider DC-CV at some point. Continue Xarelto.   Follow-up ECG today in office shows she is back in SR. Increase amio to 200 qd.

## 2010-09-09 ENCOUNTER — Ambulatory Visit: Payer: Self-pay | Admitting: Internal Medicine

## 2010-11-25 ENCOUNTER — Ambulatory Visit: Payer: Medicare Other | Admitting: Cardiology

## 2010-11-27 ENCOUNTER — Ambulatory Visit (INDEPENDENT_AMBULATORY_CARE_PROVIDER_SITE_OTHER): Payer: Medicare Other | Admitting: Internal Medicine

## 2010-11-27 ENCOUNTER — Encounter: Payer: Self-pay | Admitting: Internal Medicine

## 2010-11-27 ENCOUNTER — Telehealth: Payer: Self-pay | Admitting: Internal Medicine

## 2010-11-27 VITALS — BP 142/52 | HR 63 | Ht 66.0 in | Wt 142.0 lb

## 2010-11-27 DIAGNOSIS — I4891 Unspecified atrial fibrillation: Secondary | ICD-10-CM

## 2010-11-27 MED ORDER — DABIGATRAN ETEXILATE MESYLATE 150 MG PO CAPS: 150.0000 mg | ORAL_CAPSULE | Freq: Two times a day (BID) | ORAL | Status: DC

## 2010-11-27 NOTE — Assessment & Plan Note (Addendum)
She is maintaining SR on Amio 200 mg.  Cost of Xarelto has been an issue, have discussed other options with [name] and we will start her on Pradaxa 150 mg BID.  CrCl ~47 today, [name] will monitor Cr closely.  If CrCl falls below 30 she should be changed to 75 mg BID.    Addendum: After multiple discussions with the pharmacy and a few calls to the drug manufacturers, it appears both Xarelto and Pradaxa will have same co-pay (about $40/month) after initial payment of $200-300. However cost will go up once she hits the donut hole. Will continue with Xarelto at 15mg  daily based on her renal function.

## 2010-11-27 NOTE — Telephone Encounter (Signed)
Pt came to see Dr. Gala Romney today and he prescribed pt a medication and pt just went to get it filled pt said RX is $237 and pt wanted to know if Dr. Gala Romney can write pt a alternate. Originally pt was taking xarelto which was $200 and wanting a less expensive alternative. Please return pt call to advise/discuss.

## 2010-11-27 NOTE — Telephone Encounter (Signed)
Left mess for her to go ahead and start samples that were given to her will discuss w/Dr Bensimhon and touch base with her next week

## 2010-11-27 NOTE — Patient Instructions (Signed)
Start Pradaxa 150 mg Twice daily   Your physician recommends that you return for lab work in: 2 weeks (bmet, cbc)  Your physician wants you to follow-up in: 6 months. You will receive a reminder letter in the mail two months in advance. If you don't receive a letter, please call our office to schedule the follow-up appointment.

## 2010-11-27 NOTE — Progress Notes (Signed)
HPI:  Nancy [name] is an 77 y/o woman with a history of hyperlipidemia, migraines, possible TIA and PAF.  Started on metoprolol and coumadin. Had ETT which was normal and echo EF 55-60%. moderate MR.  F/u montior revealed multiple episdoes of atrial fibrillation with RVR so started on Flecainide. On 6/21, had repeat ETT to exclude proarrhytmia with Flecainide. At start of test patient was in sinus rhythm. During early exercise she developed brief salvos of SVT. Then she developed sustained SVT/atrial flutter with rates up to 180 with signifcant ST depression. This was asymptomatic. With vagal maneuvers SVT was broken and then went into atiral fib for aschort period of time and then back into sinus rhythm. Flecainide stopped and amiodarone started.  In August 2011 was feeling fatigued so amio cut down to 200 once daily. In April 2012 was maintaining SR and amio decreased to 100 daily.  She returned for follow up at the end of May 2012, initial EKG showed Afib but repeat EKG showed SR, her amio was increased to 200 mg once daily.  She was on coumadin for stroke prevention but INRs were difficult to keep therapeutic therefore she was changed to Xarelto.  She had been having difficulty affording Xarelto and a copay card was given to her for assistance and she was to follow up with her PCP regarding further aid.    She returns today for follow up.  She is doing well.  She has tolerated the increased amio without c/o fatigue.  She denies palpitations, CP, or SOB.  She continues to be active, tennis once a week and gardening.  She has not been taking her Xarelto since the beginning of July because the copay card only covered 1 month.  She was unaware she was to continue this medication.     ROS: All systems negative except as listed in HPI, PMH and Problem List.  Past Medical History  Diagnosis Date  . Migraine   . GERD (gastroesophageal reflux disease)   . Dyslipidemia   . History of scarlet fever   . PAF  (paroxysmal atrial fibrillation)   . Depression   . Melanoma     Current Outpatient Prescriptions  Medication Sig Dispense Refill  . amiodarone (PACERONE) 200 MG tablet Take 1 tablet (200 mg total) by mouth daily.  30 tablet  12  . aspirin 81 MG tablet Take 81 mg by mouth daily.        . calcium-vitamin D (OSCAL WITH D 500-200) 500-200 MG-UNIT per tablet Take 1 tablet by mouth daily.        . fish oil-omega-3 fatty acids 1000 MG capsule Take 1 g by mouth daily.        . Ginkgo Biloba (GINKOBA) 40 MG TABS Take by mouth daily.        . Multiple Vitamin (MULTIVITAMIN) tablet Take 1 tablet by mouth daily.        Marland Kitchen pyridOXINE (VITAMIN B-6) 100 MG tablet Take 100 mg by mouth daily.        . Rivaroxaban (XARELTO) 20 MG TABS Take 1 tablet by mouth daily.  30 tablet  0  . simvastatin (ZOCOR) 20 MG tablet Take 1 tablet by mouth daily.         PHYSICAL EXAM: Filed Vitals:   11/27/10 1403  BP: 142/52  Pulse: 63   General: Elderly no distress. HEENT: normal Neck: supple. no JVD. Carotids 2+ bilat; no bruits. No lymphadenopathy or thryomegaly appreciated. Cor: PMI nondisplaced. Regular. No  rubs, gallops, murmur. Lungs: clear Abdomen: soft, nontender, nondistended.  Good bowel sounds. Extremities: no cyanosis, clubbing, rash, edema Neuro: alert & orientedx3, cranial nerves grossly intact.affect pleasant   ECG: NSR 63, No ST-T wave abnormalities.     ASSESSMENT & PLAN:

## 2010-12-01 NOTE — Telephone Encounter (Signed)
We put her on Xarelto 15.

## 2010-12-11 ENCOUNTER — Other Ambulatory Visit (INDEPENDENT_AMBULATORY_CARE_PROVIDER_SITE_OTHER): Payer: Medicare Other | Admitting: *Deleted

## 2010-12-11 ENCOUNTER — Telehealth: Payer: Self-pay

## 2010-12-11 ENCOUNTER — Ambulatory Visit: Payer: Medicare Other

## 2010-12-11 DIAGNOSIS — I4891 Unspecified atrial fibrillation: Secondary | ICD-10-CM

## 2010-12-11 LAB — BASIC METABOLIC PANEL
BUN: 17 mg/dL (ref 6–23)
CO2: 28 mEq/L (ref 19–32)
Calcium: 9.4 mg/dL (ref 8.4–10.5)
Creatinine, Ser: 1.2 mg/dL (ref 0.4–1.2)
GFR: 45.16 mL/min — ABNORMAL LOW (ref >60.00)
Glucose, Bld: 92 mg/dL (ref 70–99)

## 2010-12-11 NOTE — Telephone Encounter (Signed)
Pt received a letter in the mail from her new insurance company stating that they will not cover her Pradaxa because it is not formulary.  She also states she has only been taking one tablet daily because she experienced swelling and itching in her arms, hands and legs while taking Pradaxa bid and being out in the sun the first few days she was on it.  When she cut back to Pradaxa one qd, the symptoms subsided and this is what she has been taking since.  She wants to know if she should go back to [place] or Warfarin or stay on the one tablet daily of Pradaxa if we can get authorization from [facility] to continue it?  Please advise.  Thanks.

## 2010-12-12 LAB — CBC WITH DIFFERENTIAL/PLATELET
Basophils Absolute: 0 10*3/uL (ref 0.0–0.1)
Basophils Relative: 0 % (ref 0–1)
Eosinophils Absolute: 0.2 10*3/uL (ref 0.0–0.7)
Hemoglobin: 13.1 g/dL (ref 12.0–15.0)
MCH: 34.4 pg — ABNORMAL HIGH (ref 26.0–34.0)
MCHC: 37 g/dL — ABNORMAL HIGH (ref 30.0–36.0)
Monocytes Absolute: 0.4 10*3/uL (ref 0.1–1.0)
Monocytes Relative: 2 % — ABNORMAL LOW (ref 3–12)
Neutro Abs: 16 10*3/uL — ABNORMAL HIGH (ref 1.7–7.7)
Neutrophils Relative %: 86 % — ABNORMAL HIGH (ref 43–77)
RDW: 15.7 % — ABNORMAL HIGH (ref 11.5–15.5)

## 2010-12-14 MED ORDER — RIVAROXABAN 15 MG PO TABS: 15.0000 mg | ORAL_TABLET | Freq: Every day | ORAL | Status: DC

## 2010-12-14 NOTE — Telephone Encounter (Signed)
I think we should switch to Xarelto 15mg  daily if she can get coverage for this. She was not a good coumadin candidate as they couldn't get her INR levels stable. Thanks -dan

## 2010-12-14 NOTE — Telephone Encounter (Signed)
Have spent a numerous amount of time on the phone with pt's insurance company both Pradaxa and Xarelto are on formulary, but pt just switch to this insurance plan in July so she is in the "deductable phase" she will need to reach a deductible of $320 before they start to pay for med, and then she will move into the "copay phase" and it will cost her $41 a month until she hits the "donut hole" she is aware of all of this and will pay the initial $320, rx for xarelto sent to pharmacy

## 2010-12-15 NOTE — Progress Notes (Signed)
Pt.notified

## 2010-12-17 ENCOUNTER — Encounter: Payer: Self-pay | Admitting: Internal Medicine

## 2011-01-25 ENCOUNTER — Other Ambulatory Visit (HOSPITAL_COMMUNITY): Payer: Self-pay | Admitting: Internal Medicine

## 2011-01-25 DIAGNOSIS — Z1231 Encounter for screening mammogram for malignant neoplasm of breast: Secondary | ICD-10-CM

## 2011-02-03 ENCOUNTER — Other Ambulatory Visit: Payer: Self-pay | Admitting: Internal Medicine

## 2011-02-03 DIAGNOSIS — Z1231 Encounter for screening mammogram for malignant neoplasm of breast: Secondary | ICD-10-CM

## 2011-03-01 ENCOUNTER — Ambulatory Visit: Payer: Medicare Other | Admitting: Internal Medicine

## 2011-03-03 ENCOUNTER — Ambulatory Visit
Admission: RE | Admit: 2011-03-03 | Discharge: 2011-03-03 | Disposition: A | Discharge status: Home / Self Care | Payer: Medicare Other | Source: Ambulatory Visit | Attending: Internal Medicine | Admitting: Internal Medicine

## 2011-03-03 DIAGNOSIS — Z1231 Encounter for screening mammogram for malignant neoplasm of breast: Secondary | ICD-10-CM

## 2011-04-22 ENCOUNTER — Telehealth: Payer: Self-pay | Admitting: Internal Medicine

## 2011-04-22 NOTE — Telephone Encounter (Signed)
We will see her here in HF clinic, Dawn please schedule, Feb is fine

## 2011-04-22 NOTE — Telephone Encounter (Signed)
New msg: pt calling to schedule yearly appt and wants to know if she needs to see Dr. Gala [name] in the [facility] off, [facility] or if pt care needs to be transferred to another provider. Please return pt call to discuss further.

## 2011-04-22 NOTE — Telephone Encounter (Signed)
Please evaluate status and advise if she should be transferred to one of the other doctors, please let Marlowe Kays know asap, thank you.

## 2011-05-05 DIAGNOSIS — H2640 Unspecified secondary cataract: Secondary | ICD-10-CM | POA: Diagnosis not present

## 2011-05-05 DIAGNOSIS — H18009 Unspecified corneal deposit, unspecified eye: Secondary | ICD-10-CM | POA: Diagnosis not present

## 2011-06-07 ENCOUNTER — Ambulatory Visit (HOSPITAL_COMMUNITY)
Admission: RE | Admit: 2011-06-07 | Discharge: 2011-06-07 | Disposition: A | Discharge status: Home / Self Care | Payer: Medicare Other | Source: Ambulatory Visit | Attending: Internal Medicine | Admitting: Internal Medicine

## 2011-06-07 VITALS — BP 118/60 | HR 100 | Wt 143.0 lb

## 2011-06-07 DIAGNOSIS — E785 Hyperlipidemia, unspecified: Secondary | ICD-10-CM | POA: Insufficient documentation

## 2011-06-07 DIAGNOSIS — F3289 Other specified depressive episodes: Secondary | ICD-10-CM | POA: Insufficient documentation

## 2011-06-07 DIAGNOSIS — Z85820 Personal history of malignant melanoma of skin: Secondary | ICD-10-CM | POA: Insufficient documentation

## 2011-06-07 DIAGNOSIS — K219 Gastro-esophageal reflux disease without esophagitis: Secondary | ICD-10-CM | POA: Diagnosis not present

## 2011-06-07 DIAGNOSIS — Z7982 Long term (current) use of aspirin: Secondary | ICD-10-CM | POA: Diagnosis not present

## 2011-06-07 DIAGNOSIS — I4891 Unspecified atrial fibrillation: Secondary | ICD-10-CM | POA: Diagnosis not present

## 2011-06-07 DIAGNOSIS — F329 Major depressive disorder, single episode, unspecified: Secondary | ICD-10-CM | POA: Diagnosis not present

## 2011-06-07 DIAGNOSIS — Z7901 Long term (current) use of anticoagulants: Secondary | ICD-10-CM | POA: Diagnosis not present

## 2011-06-07 DIAGNOSIS — Z79899 Other long term (current) drug therapy: Secondary | ICD-10-CM | POA: Diagnosis not present

## 2011-06-07 DIAGNOSIS — G43909 Migraine, unspecified, not intractable, without status migrainosus: Secondary | ICD-10-CM | POA: Diagnosis not present

## 2011-06-07 NOTE — Patient Instructions (Signed)
Will order holter monitor, they will call you when it is available next week.    Continue xarelto and amiodarone.    Follow up 3 months.

## 2011-06-13 NOTE — Progress Notes (Signed)
HPI:  Nancy [name] is an 78 y/o woman with a history of hyperlipidemia, migraines, possible TIA and PAF.  Started on metoprolol and coumadin. Had ETT which was normal and echo EF 55-60%. moderate MR.  F/u montior revealed multiple episdoes of atrial fibrillation with RVR so started on Flecainide. On 6/21, had repeat ETT to exclude proarrhytmia with Flecainide. At start of test patient was in sinus rhythm. During early exercise she developed brief salvos of SVT. Then she developed sustained SVT/atrial flutter with rates up to 180 with signifcant ST depression. This was asymptomatic. With vagal maneuvers SVT was broken and then went into atiral fib for aschort period of time and then back into sinus rhythm. Flecainide stopped and amiodarone started.  In August 2011 was feeling fatigued so amio cut down to 200 once daily. In April 2012 was maintaining SR and amio decreased to 100 daily.  She returned for follow up at the end of May 2012, initial EKG showed Afib but repeat EKG showed SR, her amio was increased to 200 mg once daily.  She was on coumadin for stroke prevention but INRs were difficult to keep therapeutic therefore she was changed to Xarelto.  She had been having difficulty affording Xarelto and a copay card was given to her for assistance and she was to follow up with her PCP regarding further aid.    She returns today for follow up.  She is doing well.  She continues to exercise 4 x/week and work in her garden.  She is tolerating amio without c/o fatigue.  She denies palpitations, CP, or SOB.  + cough with phlegm.  She has been taking her Xarelto.     ROS: All systems negative except as listed in HPI, PMH and Problem List.  Past Medical History  Diagnosis Date  . Migraine   . GERD (gastroesophageal reflux disease)   . Dyslipidemia   . History of scarlet fever   . PAF (paroxysmal atrial fibrillation)   . Depression   . Melanoma     Current Outpatient Prescriptions  Medication Sig Dispense  Refill  . amiodarone (PACERONE) 200 MG tablet Take 1 tablet (200 mg total) by mouth daily.  30 tablet  12  . calcium-vitamin D (OSCAL WITH D 500-200) 500-200 MG-UNIT per tablet Take 1 tablet by mouth daily.        Marland Kitchen glucosamine-chondroitin 500-400 MG tablet Take 1 tablet by mouth daily.      . Multiple Vitamin (MULTIVITAMIN) tablet Take 1 tablet by mouth daily.        Marland Kitchen pyridOXINE (VITAMIN B-6) 100 MG tablet Take 100 mg by mouth daily.        . Rivaroxaban (XARELTO) 15 MG TABS tablet Take 20 mg by mouth daily.      Marland Kitchen aspirin 81 MG tablet Take 81 mg by mouth daily.        . fish oil-omega-3 fatty acids 1000 MG capsule Take 1 g by mouth daily.        . Ginkgo Biloba (GINKOBA) 40 MG TABS Take by mouth daily.        . simvastatin (ZOCOR) 20 MG tablet Take 1 tablet by mouth daily.         PHYSICAL EXAM: Filed Vitals:   06/07/11 1454  BP: 118/60  Pulse: 100  Weight: 143 lb (64.864 kg)  SpO2: 98%    General: Elderly no distress. HEENT: normal Neck: supple. no JVD. Carotids 2+ bilat; no bruits. No lymphadenopathy or thryomegaly appreciated.  Cor: PMI nondisplaced. Irregular.  No rubs, gallops, murmur. Lungs: clear Abdomen: soft, nontender, nondistended.  Good bowel sounds. Extremities: no cyanosis, clubbing, rash, edema Neuro: alert & orientedx3, cranial nerves grossly intact.affect pleasant   ECG:  Atrial fibrillation 95 bpm    ASSESSMENT & PLAN:

## 2011-06-13 NOTE — Assessment & Plan Note (Addendum)
She is back in AF today (despite amio) with controlled VR. We will place a heart monitor on her to evaluate burden of AF. If she is in it consistently, I think the best approach may be to stop the amio and continue with rate control as long as she can tolerate it symptomatically. If in and out of AF may be reasonable to increase her amio for a short period of time and see if she responds. Needs to continue anti-coagulation.

## 2011-06-16 DIAGNOSIS — E559 Vitamin D deficiency, unspecified: Secondary | ICD-10-CM | POA: Diagnosis not present

## 2011-06-16 DIAGNOSIS — R82998 Other abnormal findings in urine: Secondary | ICD-10-CM | POA: Diagnosis not present

## 2011-06-16 DIAGNOSIS — E785 Hyperlipidemia, unspecified: Secondary | ICD-10-CM | POA: Diagnosis not present

## 2011-06-17 DIAGNOSIS — E559 Vitamin D deficiency, unspecified: Secondary | ICD-10-CM | POA: Diagnosis not present

## 2011-06-17 DIAGNOSIS — Z0000 Encounter for general adult medical examination without abnormal findings: Secondary | ICD-10-CM | POA: Diagnosis not present

## 2011-06-17 DIAGNOSIS — E785 Hyperlipidemia, unspecified: Secondary | ICD-10-CM | POA: Diagnosis not present

## 2011-06-17 DIAGNOSIS — I4891 Unspecified atrial fibrillation: Secondary | ICD-10-CM | POA: Diagnosis not present

## 2011-06-22 ENCOUNTER — Encounter (INDEPENDENT_AMBULATORY_CARE_PROVIDER_SITE_OTHER): Payer: Medicare Other

## 2011-06-22 DIAGNOSIS — I4891 Unspecified atrial fibrillation: Secondary | ICD-10-CM | POA: Diagnosis not present

## 2011-06-22 DIAGNOSIS — Z1212 Encounter for screening for malignant neoplasm of rectum: Secondary | ICD-10-CM | POA: Diagnosis not present

## 2011-07-05 ENCOUNTER — Other Ambulatory Visit: Payer: Self-pay

## 2011-07-05 MED ORDER — AMIODARONE HCL 200 MG PO TABS: 200.0000 mg | ORAL_TABLET | Freq: Every day | ORAL | Status: DC

## 2011-07-09 ENCOUNTER — Telehealth: Payer: Self-pay | Admitting: Internal Medicine

## 2011-07-09 NOTE — Telephone Encounter (Signed)
New msg Pt had monitor over a week ago and hasnt heard any results

## 2011-07-09 NOTE — Telephone Encounter (Signed)
Left message for pt, bensimhon has been out this week. The monitor is at the hosp for his review.

## 2011-07-13 ENCOUNTER — Telehealth (HOSPITAL_COMMUNITY): Payer: Self-pay | Admitting: *Deleted

## 2011-07-13 NOTE — Telephone Encounter (Signed)
Called pt w/results from holter monitor, SR w/episodes of AF w/RVR, he would like for pt to increase Amio to 200 mg bid for 2 weeks then back to 200 mg daily pt is aware and agreeable

## 2011-07-22 ENCOUNTER — Other Ambulatory Visit: Payer: Self-pay | Admitting: *Deleted

## 2011-07-22 DIAGNOSIS — I6529 Occlusion and stenosis of unspecified carotid artery: Secondary | ICD-10-CM

## 2011-07-23 ENCOUNTER — Encounter (INDEPENDENT_AMBULATORY_CARE_PROVIDER_SITE_OTHER): Payer: Medicare Other

## 2011-07-23 DIAGNOSIS — I6529 Occlusion and stenosis of unspecified carotid artery: Secondary | ICD-10-CM | POA: Diagnosis not present

## 2011-07-29 DIAGNOSIS — L570 Actinic keratosis: Secondary | ICD-10-CM | POA: Diagnosis not present

## 2011-07-29 DIAGNOSIS — D0460 Carcinoma in situ of skin of unspecified upper limb, including shoulder: Secondary | ICD-10-CM | POA: Diagnosis not present

## 2011-07-29 DIAGNOSIS — C44611 Basal cell carcinoma of skin of unspecified upper limb, including shoulder: Secondary | ICD-10-CM | POA: Diagnosis not present

## 2011-07-29 DIAGNOSIS — C44319 Basal cell carcinoma of skin of other parts of face: Secondary | ICD-10-CM | POA: Diagnosis not present

## 2011-07-29 DIAGNOSIS — Z85820 Personal history of malignant melanoma of skin: Secondary | ICD-10-CM | POA: Diagnosis not present

## 2011-07-29 DIAGNOSIS — C44621 Squamous cell carcinoma of skin of unspecified upper limb, including shoulder: Secondary | ICD-10-CM | POA: Diagnosis not present

## 2011-07-29 DIAGNOSIS — L821 Other seborrheic keratosis: Secondary | ICD-10-CM | POA: Diagnosis not present

## 2011-08-18 DIAGNOSIS — C44611 Basal cell carcinoma of skin of unspecified upper limb, including shoulder: Secondary | ICD-10-CM | POA: Diagnosis not present

## 2011-08-18 DIAGNOSIS — C44319 Basal cell carcinoma of skin of other parts of face: Secondary | ICD-10-CM | POA: Diagnosis not present

## 2011-09-09 ENCOUNTER — Encounter (HOSPITAL_COMMUNITY): Payer: Self-pay

## 2011-09-09 ENCOUNTER — Ambulatory Visit (HOSPITAL_COMMUNITY)
Admission: RE | Admit: 2011-09-09 | Discharge: 2011-09-09 | Disposition: A | Discharge status: Home / Self Care | Payer: Medicare Other | Source: Ambulatory Visit | Attending: Internal Medicine | Admitting: Internal Medicine

## 2011-09-09 VITALS — BP 120/79 | HR 91 | Ht 66.0 in | Wt 144.0 lb

## 2011-09-09 DIAGNOSIS — I4892 Unspecified atrial flutter: Secondary | ICD-10-CM | POA: Insufficient documentation

## 2011-09-09 DIAGNOSIS — Z7901 Long term (current) use of anticoagulants: Secondary | ICD-10-CM | POA: Diagnosis not present

## 2011-09-09 DIAGNOSIS — K219 Gastro-esophageal reflux disease without esophagitis: Secondary | ICD-10-CM | POA: Insufficient documentation

## 2011-09-09 DIAGNOSIS — F329 Major depressive disorder, single episode, unspecified: Secondary | ICD-10-CM | POA: Insufficient documentation

## 2011-09-09 DIAGNOSIS — Z85820 Personal history of malignant melanoma of skin: Secondary | ICD-10-CM | POA: Diagnosis not present

## 2011-09-09 DIAGNOSIS — G43909 Migraine, unspecified, not intractable, without status migrainosus: Secondary | ICD-10-CM | POA: Diagnosis not present

## 2011-09-09 DIAGNOSIS — E785 Hyperlipidemia, unspecified: Secondary | ICD-10-CM | POA: Diagnosis not present

## 2011-09-09 DIAGNOSIS — I059 Rheumatic mitral valve disease, unspecified: Secondary | ICD-10-CM | POA: Diagnosis not present

## 2011-09-09 DIAGNOSIS — I4891 Unspecified atrial fibrillation: Secondary | ICD-10-CM | POA: Insufficient documentation

## 2011-09-09 DIAGNOSIS — F3289 Other specified depressive episodes: Secondary | ICD-10-CM | POA: Insufficient documentation

## 2011-09-09 DIAGNOSIS — I498 Other specified cardiac arrhythmias: Secondary | ICD-10-CM | POA: Insufficient documentation

## 2011-09-09 NOTE — Progress Notes (Signed)
Patient ID: Nancy [name], female   DOB: April 30, 1925, 78 y.o.   MRN: [id number] HPI:  Nancy [name] is an 78 y/o woman with a history of hyperlipidemia, migraines, possible TIA and PAF.  Started on metoprolol and coumadin. Had ETT which was normal and echo EF 55-60%. moderate MR.  F/u montior revealed multiple episdoes of atrial fibrillation with RVR so started on Flecainide. On 6/21, had repeat ETT to exclude proarrhytmia with Flecainide. At start of test patient was in sinus rhythm. During early exercise she developed brief salvos of SVT. Then she developed sustained SVT/atrial flutter with rates up to 180 with signifcant ST depression. This was asymptomatic. With vagal maneuvers SVT was broken and then went into atiral fib for a short period of time and then back into sinus rhythm. Flecainide stopped and amiodarone started.  In August 2011 was feeling fatigued so amio cut down to 200 once daily. In April 2012 was maintaining SR and amio decreased to 100 daily.  She returned for follow up at the end of May 2012, initial EKG showed Afib but repeat EKG showed SR, her amio was increased to 200 mg once daily.  She was on coumadin for stroke prevention but INRs were difficult to keep therapeutic therefore she was changed to Xarelto.   She wore a 48 hour Holter monitor in March 2013 this showed SR with multiple episodes of AF with occasional RVR. Peak rate 141. Mean rate 69. Amiodarone increased to 200 bid for 2 weeks then back down to 200 daily.   Prior to last week was having multiple episodes where she just felt wiped out. Says it was worse when she bent over but also noticed it on other occasioans. Decided to drink more water and now feels much better. No further episodes. Denies palpitations.    ROS: All systems negative except as listed in HPI, PMH and Problem List.  Past Medical History  Diagnosis Date  . Migraine   . GERD (gastroesophageal reflux disease)   . Dyslipidemia   . History of scarlet fever    . PAF (paroxysmal atrial fibrillation)   . Depression   . Melanoma     Current Outpatient Prescriptions  Medication Sig Dispense Refill  . amiodarone (PACERONE) 200 MG tablet Take 1 tablet (200 mg total) by mouth daily.  30 tablet  12  . calcium-vitamin D (OSCAL WITH D 500-200) 500-200 MG-UNIT per tablet Take 1 tablet by mouth daily.        Marland Kitchen glucosamine-chondroitin 500-400 MG tablet Take 1 tablet by mouth daily.      . Multiple Vitamin (MULTIVITAMIN) tablet Take 1 tablet by mouth daily.        Marland Kitchen pyridOXINE (VITAMIN B-6) 100 MG tablet Take 100 mg by mouth daily.        . Rivaroxaban (XARELTO) 15 MG TABS tablet Take 20 mg by mouth daily.         PHYSICAL EXAM: Filed Vitals:   09/09/11 1353  BP: 120/79  Pulse: 91  Height: 5\' 6"  (1.676 m)  Weight: 144 lb (65.318 kg)    General: Elderly no distress. HEENT: normal Neck: supple. no JVD. Carotids 2+ bilat; no bruits. No lymphadenopathy or thryomegaly appreciated. Cor: PMI nondisplaced. Irregular.  No rubs, gallops, murmur. Lungs: clear Abdomen: soft, nontender, nondistended.  Good bowel sounds. Extremities: no cyanosis, clubbing, rash, edema Neuro: alert & orientedx3, cranial nerves grossly intact.affect pleasant   ECG:  Atrial fibrillation 78 bpm    ASSESSMENT & PLAN:

## 2011-09-09 NOTE — Patient Instructions (Addendum)
Your physician has recommended that you wear a holter monitor. Holter monitors are medical devices that record the heart's electrical activity. Doctors most often use these monitors to diagnose arrhythmias. Arrhythmias are problems with the speed or rhythm of the heartbeat. The monitor is a small, portable device. You can wear one while you do your normal daily activities. This is usually used to diagnose what is causing palpitations/syncope (passing out).  You have been referred to Dr Johney Frame on August 5th at 11 am, they will call you before then if they have a cancellation    We will contact you in 6 months to schedule your next appointment.

## 2011-09-09 NOTE — Assessment & Plan Note (Signed)
This is a difficult situation. I think part of her symptoms were from orthostasis but I also think she is still somewhat symptomatic from her AF (she was profoundly symptomatic originally). We will go ahead and place another 48 hour monitor to quantify her burden of AF and her rate control. If AF burden is low may be reasonable to keep her on amio. If it is high, I think it would probably be best to stop amiodarone and rate control with b-blocker or CCB and see how she does. I will refer her to Dr. Johney Frame as well to get his thoughts. Continue xarelto.

## 2011-09-09 NOTE — Progress Notes (Signed)
Encounter addended by: Noralee Space, RN on: 09/09/2011  3:21 PM<BR>     Documentation filed: Patient Instructions Section

## 2011-09-10 ENCOUNTER — Encounter (INDEPENDENT_AMBULATORY_CARE_PROVIDER_SITE_OTHER): Payer: Medicare Other

## 2011-09-10 DIAGNOSIS — I4891 Unspecified atrial fibrillation: Secondary | ICD-10-CM

## 2011-09-10 NOTE — Progress Notes (Signed)
Encounter addended by: Enrique Manganaro C Jacquelyn Shadrick on: 09/10/2011  8:19 AM<BR>     Documentation filed: Charges VN

## 2011-09-23 ENCOUNTER — Telehealth (HOSPITAL_COMMUNITY): Payer: Self-pay | Admitting: *Deleted

## 2011-09-23 NOTE — Telephone Encounter (Signed)
Called pt w/results of holter monitor, SR w/PAF mean HR 72 keep appt as scheduled with Dr Johney Frame 8/5, pt aware and agreeable

## 2011-10-19 DIAGNOSIS — J309 Allergic rhinitis, unspecified: Secondary | ICD-10-CM | POA: Diagnosis not present

## 2011-10-19 DIAGNOSIS — R05 Cough: Secondary | ICD-10-CM | POA: Diagnosis not present

## 2011-10-19 DIAGNOSIS — J45909 Unspecified asthma, uncomplicated: Secondary | ICD-10-CM | POA: Diagnosis not present

## 2011-10-19 DIAGNOSIS — R0602 Shortness of breath: Secondary | ICD-10-CM | POA: Diagnosis not present

## 2011-11-08 ENCOUNTER — Ambulatory Visit (INDEPENDENT_AMBULATORY_CARE_PROVIDER_SITE_OTHER): Payer: Medicare Other | Admitting: Internal Medicine

## 2011-11-08 ENCOUNTER — Encounter: Payer: Self-pay | Admitting: Internal Medicine

## 2011-11-08 VITALS — BP 130/70 | HR 71 | Resp 18 | Ht 63.0 in | Wt 138.0 lb

## 2011-11-08 DIAGNOSIS — I4891 Unspecified atrial fibrillation: Secondary | ICD-10-CM

## 2011-11-08 NOTE — Progress Notes (Signed)
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 female with a h/o paroxysmal atrial fibrillation who presents today for EP consultation.  She has had atrial fibrillation for several years.  She is well known to Dr Gala Romney and has tried several different antiarrhythmic medicines.  She was initially treated with flecainide, however this medicine was stopped due to proarrhythmia with GXT (appears to have had SVT during the study).  She was placed on amiodarone.  Her amiodarone was eventually weaned to 100mg  daily.  With this dose, she developed recurrent afib and her dose was therefore returned to 200mg  daily.  She has been on amiodarone 200mg  daily since 4/12.  She has recently had increasing frequency of afib as documented by recent holter monitor.  Interestingly, she does not appear to known when she is in afib.  She has occasional fatigue but finds that with these episodes her pulse typically is regular and not elevated.  She remains quite active for her age.  She denies palpitations, dizziness, presyncope, or syncope. She is presently anticoagulated with xarelto.  The patient is tolerating medications without difficulties and is otherwise without complaint today.   Past Medical History  Diagnosis Date  . Migraine   . GERD (gastroesophageal reflux disease)   . Dyslipidemia   . History of scarlet fever   . PAF (paroxysmal atrial fibrillation)   . Depression   . Melanoma    Past Surgical History  Procedure Date  . Tonsillectomy   . Appendectomy   . Cataract extraction     bilateral    Current Outpatient Prescriptions  Medication Sig Dispense Refill  . amiodarone (PACERONE) 200 MG tablet Take 1 tablet (200 mg total) by mouth daily.  30 tablet  12  . benzonatate (TESSALON) 200 MG capsule Take 200 mg by mouth 2 (two) times daily as needed.      . calcium-vitamin D (OSCAL WITH D 500-200) 500-200 MG-UNIT per tablet Take 1 tablet by  mouth daily.        . Multiple Vitamin (MULTIVITAMIN) tablet Take 1 tablet by mouth daily.        Marland Kitchen pyridOXINE (VITAMIN B-6) 100 MG tablet Take 100 mg by mouth daily.        . Rivaroxaban (XARELTO) 15 MG TABS tablet Take 20 mg by mouth daily.        No Known Allergies  History   Social History  . Marital Status: Married    Spouse Name: N/A    Number of Children: N/A  . Years of Education: N/A   Occupational History  . Not on file.   Social History Main Topics  . Smoking status: Former Smoker    Quit date: 09/09/1967  . Smokeless tobacco: Not on file  . Alcohol Use: Yes  . Drug Use: No  . Sexually Active: Not on file   Other Topics Concern  . Not on file   Social History Narrative  . No narrative on file    Family History  Problem Relation Age of Onset  . Colon polyps      ROS- All systems are reviewed and negative except as per the HPI above  Physical Exam: Filed Vitals:   11/08/11 1128  BP: 130/70  Pulse: 71  Resp: 18  Height: 5\' 3"  (1.6 m)  Weight: 138 lb (62.596 kg)  SpO2: 96%    GEN- The patient is elderly appearing, alert and oriented x  3 today.   Head- normocephalic, atraumatic Eyes-  Sclera clear, conjunctiva pink Ears- hearing intact Oropharynx- clear Neck- supple,  Lungs- Clear to ausculation bilaterally, normal work of breathing Heart- Regular rate and rhythm, no murmurs, rubs or gallops, PMI not laterally displaced GI- soft, NT, ND, + BS Extremities- no clubbing, cyanosis, or edema MS- age appropriate muscle atrophy Skin- no rash or lesion Psych- euthymic mood, full affect Neuro- strength and sensation are intact  Echo 2011 reviewed EKG today reveals sinus rhythm 69 bpm, PR 166, QRS 86, Qtc 467  Assessment and Plan:

## 2011-11-08 NOTE — Patient Instructions (Addendum)
Your physician wants you to follow-up in: 6 months with Dr. Allred. You will receive a reminder letter in the mail two months in advance. If you don't receive a letter, please call our office to schedule the follow-up appointment.  

## 2011-11-08 NOTE — Assessment & Plan Note (Signed)
The patient presents today for Ep consultation regarding atrial fibrillation.  I have reviewed her recent holter monitor which revealed both atrial fibrillation and atrial flutter.  Ventricular rates were reasonable controlled.  She is appropriately anticoagulated with xarelto.  She presently appears to be minimally symptomatic with her afib.  She has a h/o Scarlet fever as a child. At this point, I would favor continuing her current regimen.  I do think that her amiodarone is helping with maintenance of sinus rhythm to some today.  Should her afib progress to a more persistent degree then I think that rate control would be reasonable at that time. Given her advanced age and mild symptoms, I would not recommend catheter ablation.  No changes are made today. She will return for follow-up in 6 months.

## 2012-01-25 ENCOUNTER — Other Ambulatory Visit (HOSPITAL_COMMUNITY): Payer: Self-pay | Admitting: Internal Medicine

## 2012-01-25 DIAGNOSIS — Z1231 Encounter for screening mammogram for malignant neoplasm of breast: Secondary | ICD-10-CM

## 2012-01-27 DIAGNOSIS — M25559 Pain in unspecified hip: Secondary | ICD-10-CM | POA: Diagnosis not present

## 2012-01-27 DIAGNOSIS — M47817 Spondylosis without myelopathy or radiculopathy, lumbosacral region: Secondary | ICD-10-CM | POA: Diagnosis not present

## 2012-01-27 DIAGNOSIS — Z23 Encounter for immunization: Secondary | ICD-10-CM | POA: Diagnosis not present

## 2012-01-27 DIAGNOSIS — I4891 Unspecified atrial fibrillation: Secondary | ICD-10-CM | POA: Diagnosis not present

## 2012-01-27 DIAGNOSIS — M549 Dorsalgia, unspecified: Secondary | ICD-10-CM | POA: Diagnosis not present

## 2012-02-03 DIAGNOSIS — L819 Disorder of pigmentation, unspecified: Secondary | ICD-10-CM | POA: Diagnosis not present

## 2012-02-03 DIAGNOSIS — Z85820 Personal history of malignant melanoma of skin: Secondary | ICD-10-CM | POA: Diagnosis not present

## 2012-02-03 DIAGNOSIS — L821 Other seborrheic keratosis: Secondary | ICD-10-CM | POA: Diagnosis not present

## 2012-03-06 ENCOUNTER — Ambulatory Visit (HOSPITAL_COMMUNITY)
Admission: RE | Admit: 2012-03-06 | Discharge: 2012-03-06 | Disposition: A | Discharge status: Home / Self Care | Payer: Medicare Other | Source: Ambulatory Visit | Attending: Internal Medicine | Admitting: Internal Medicine

## 2012-03-06 DIAGNOSIS — Z1231 Encounter for screening mammogram for malignant neoplasm of breast: Secondary | ICD-10-CM | POA: Insufficient documentation

## 2012-03-08 ENCOUNTER — Other Ambulatory Visit: Payer: Self-pay | Admitting: Internal Medicine

## 2012-03-08 DIAGNOSIS — R928 Other abnormal and inconclusive findings on diagnostic imaging of breast: Secondary | ICD-10-CM

## 2012-03-16 ENCOUNTER — Ambulatory Visit
Admission: RE | Admit: 2012-03-16 | Discharge: 2012-03-16 | Disposition: A | Discharge status: Home / Self Care | Payer: Medicare Other | Source: Ambulatory Visit | Attending: Internal Medicine | Admitting: Internal Medicine

## 2012-03-16 DIAGNOSIS — R928 Other abnormal and inconclusive findings on diagnostic imaging of breast: Secondary | ICD-10-CM

## 2012-05-08 ENCOUNTER — Ambulatory Visit (INDEPENDENT_AMBULATORY_CARE_PROVIDER_SITE_OTHER): Payer: Medicare Other | Admitting: Internal Medicine

## 2012-05-08 ENCOUNTER — Encounter: Payer: Self-pay | Admitting: Internal Medicine

## 2012-05-08 VITALS — BP 145/55 | HR 68 | Ht 66.0 in | Wt 136.8 lb

## 2012-05-08 DIAGNOSIS — I4891 Unspecified atrial fibrillation: Secondary | ICD-10-CM | POA: Diagnosis not present

## 2012-05-08 LAB — CBC WITH DIFFERENTIAL/PLATELET
Basophils Absolute: 0 10*3/uL (ref 0.0–0.1)
Basophils Relative: 0.4 % (ref 0.0–3.0)
Eosinophils Absolute: 0.2 10*3/uL (ref 0.0–0.7)
Lymphocytes Relative: 18 % (ref 12.0–46.0)
MCHC: 33.7 g/dL (ref 30.0–36.0)
MCV: 91.9 fl (ref 78.0–100.0)
Monocytes Absolute: 0.4 10*3/uL (ref 0.1–1.0)
Neutrophils Relative %: 72.1 % (ref 43.0–77.0)
Platelets: 307 10*3/uL (ref 150.0–400.0)
RBC: 4.21 Mil/uL (ref 3.87–5.11)
WBC: 6.4 10*3/uL (ref 4.5–10.5)

## 2012-05-08 LAB — BASIC METABOLIC PANEL
BUN: 13 mg/dL (ref 6–23)
Chloride: 103 mEq/L (ref 96–112)
GFR: 42.56 mL/min — ABNORMAL LOW (ref >60.00)
Potassium: 3.9 mEq/L (ref 3.5–5.1)
Sodium: 140 mEq/L (ref 135–145)

## 2012-05-08 LAB — T4, FREE: Free T4: 1.53 ng/dL (ref 0.60–1.60)

## 2012-05-08 LAB — TSH: TSH: 1.14 u[IU]/mL (ref 0.35–5.50)

## 2012-05-08 NOTE — Progress Notes (Signed)
PCP: Gwen Pounds, MD Primary Cardiologist:  Previously Dr Gala Romney  Nancy [name] is a 79 y.o. female who presents today for routine electrophysiology followup.  Since last being seen in our clinic, the patient reports doing very well.   She remains in sinus rhythm.  Today, she denies symptoms of palpitations, chest pain, shortness of breath,  lower extremity edema, presyncope, or syncope.  She has occasional postural dizziness.  The patient is otherwise without complaint today.   Past Medical History  Diagnosis Date  . Migraine   . GERD (gastroesophageal reflux disease)   . Dyslipidemia   . History of scarlet fever   . PAF (paroxysmal atrial fibrillation)   . Depression   . Melanoma    Past Surgical History  Procedure Date  . Tonsillectomy   . Appendectomy   . Cataract extraction     bilateral    Current Outpatient Prescriptions  Medication Sig Dispense Refill  . amiodarone (PACERONE) 200 MG tablet Take 1 tablet (200 mg total) by mouth daily.  30 tablet  12  . benzonatate (TESSALON) 200 MG capsule Take 200 mg by mouth 2 (two) times daily as needed.      . calcium-vitamin D (OSCAL WITH D 500-200) 500-200 MG-UNIT per tablet Take 1 tablet by mouth daily.        . Multiple Vitamin (MULTIVITAMIN) tablet Take 1 tablet by mouth daily.        Marland Kitchen pyridOXINE (VITAMIN B-6) 100 MG tablet Take 100 mg by mouth daily.        . Rivaroxaban (XARELTO) 15 MG TABS tablet Take 20 mg by mouth daily.        Physical Exam: Filed Vitals:   05/08/12 0906  BP: 145/55  Pulse: 68  Height: 5\' 6"  (1.676 m)  Weight: 136 lb 12.8 oz (62.052 kg)    GEN- The patient is well appearing, alert and oriented x 3 today.   Head- normocephalic, atraumatic Eyes-  Sclera clear, conjunctiva pink Ears- hearing intact Oropharynx- clear Lungs- Clear to ausculation bilaterally, normal work of breathing Heart- Regular rate and rhythm, no murmurs, rubs or gallops, PMI not laterally displaced GI- soft, NT, ND, +  BS Extremities- no clubbing, cyanosis, or edema  ekg today reveals sinus rhythm with PACs, 72 bpm, PR 178, nonspecific ST/T changes  Assessment and Plan:

## 2012-05-08 NOTE — Assessment & Plan Note (Signed)
She has a h/o atrial fibrillation and atrial flutter.   She appears to be maintaining sinus rhythm with amiodarone presently.  She is appropriately anticoagulated with xarelto.  Given her advanced age and mild symptoms, I would not recommend catheter ablation.  No changes are made today. LFTs/TFTs BMET and CBC today  She will return for follow-up in 6 months with Norma Fredrickson and I will see in a year.

## 2012-05-08 NOTE — Patient Instructions (Addendum)
Your physician wants you to follow-up in: 6 months with Sunday Spillers and 12 months with Dr Jacquiline Doe will receive a reminder letter in the mail two months in advance. If you don't receive a letter, please call our office to schedule the follow-up appointment.   Your physician recommends that you return for lab work today: CBC/BMP/TSH/ Free T4

## 2012-05-10 DIAGNOSIS — H2640 Unspecified secondary cataract: Secondary | ICD-10-CM | POA: Diagnosis not present

## 2012-05-10 DIAGNOSIS — H18009 Unspecified corneal deposit, unspecified eye: Secondary | ICD-10-CM | POA: Diagnosis not present

## 2012-05-10 DIAGNOSIS — Z961 Presence of intraocular lens: Secondary | ICD-10-CM | POA: Diagnosis not present

## 2012-06-12 ENCOUNTER — Ambulatory Visit (INDEPENDENT_AMBULATORY_CARE_PROVIDER_SITE_OTHER): Payer: Medicare Other | Admitting: Internal Medicine

## 2012-06-12 ENCOUNTER — Telehealth: Payer: Self-pay | Admitting: *Deleted

## 2012-06-12 VITALS — BP 122/74 | HR 74 | Resp 18 | Wt 135.0 lb

## 2012-06-12 DIAGNOSIS — I4891 Unspecified atrial fibrillation: Secondary | ICD-10-CM | POA: Diagnosis not present

## 2012-06-12 NOTE — Telephone Encounter (Signed)
Error. Patient walked in.  PE

## 2012-06-12 NOTE — Progress Notes (Signed)
Patient walked in stating she had "palpitations lasting for at least 20 minutes"  Earlier today while she was at the [employer] a parking ticket. States she had no dizziness but did get a "little short of breath". Denies current dizziness, SOB or chest pain. She states that she has not taken any of her medications for about a week because she has been out of town on a trip. EKG performed and showed NSR with ventricular rate of 74 bpm. Reviewed by Dr. Johney Frame. He advised she restart her medications as ordered at the same dose. She agreed to this plan.

## 2012-06-22 DIAGNOSIS — E785 Hyperlipidemia, unspecified: Secondary | ICD-10-CM | POA: Diagnosis not present

## 2012-06-22 DIAGNOSIS — E559 Vitamin D deficiency, unspecified: Secondary | ICD-10-CM | POA: Diagnosis not present

## 2012-06-29 DIAGNOSIS — R05 Cough: Secondary | ICD-10-CM | POA: Diagnosis not present

## 2012-06-29 DIAGNOSIS — E785 Hyperlipidemia, unspecified: Secondary | ICD-10-CM | POA: Diagnosis not present

## 2012-06-29 DIAGNOSIS — M549 Dorsalgia, unspecified: Secondary | ICD-10-CM | POA: Diagnosis not present

## 2012-06-29 DIAGNOSIS — M899 Disorder of bone, unspecified: Secondary | ICD-10-CM | POA: Diagnosis not present

## 2012-06-29 DIAGNOSIS — M25559 Pain in unspecified hip: Secondary | ICD-10-CM | POA: Diagnosis not present

## 2012-06-29 DIAGNOSIS — I4891 Unspecified atrial fibrillation: Secondary | ICD-10-CM | POA: Diagnosis not present

## 2012-06-29 DIAGNOSIS — M949 Disorder of cartilage, unspecified: Secondary | ICD-10-CM | POA: Diagnosis not present

## 2012-06-29 DIAGNOSIS — J309 Allergic rhinitis, unspecified: Secondary | ICD-10-CM | POA: Diagnosis not present

## 2012-06-29 DIAGNOSIS — R413 Other amnesia: Secondary | ICD-10-CM | POA: Diagnosis not present

## 2012-06-29 DIAGNOSIS — Z0000 Encounter for general adult medical examination without abnormal findings: Secondary | ICD-10-CM | POA: Diagnosis not present

## 2012-07-05 ENCOUNTER — Encounter: Payer: Self-pay | Admitting: Internal Medicine

## 2012-07-11 ENCOUNTER — Other Ambulatory Visit: Payer: Self-pay

## 2012-07-11 MED ORDER — AMIODARONE HCL 200 MG PO TABS: 200.0000 mg | ORAL_TABLET | Freq: Every day | ORAL | Status: DC

## 2012-07-13 ENCOUNTER — Ambulatory Visit (INDEPENDENT_AMBULATORY_CARE_PROVIDER_SITE_OTHER): Payer: Medicare Other | Admitting: Internal Medicine

## 2012-07-13 ENCOUNTER — Ambulatory Visit (INDEPENDENT_AMBULATORY_CARE_PROVIDER_SITE_OTHER)
Admission: RE | Admit: 2012-07-13 | Discharge: 2012-07-13 | Disposition: A | Discharge status: Home / Self Care | Payer: Medicare Other | Source: Ambulatory Visit | Attending: Internal Medicine | Admitting: Internal Medicine

## 2012-07-13 ENCOUNTER — Encounter: Payer: Self-pay | Admitting: Internal Medicine

## 2012-07-13 VITALS — BP 138/64 | HR 65 | Temp 97.7°F | Ht 66.0 in | Wt 138.2 lb

## 2012-07-13 DIAGNOSIS — J449 Chronic obstructive pulmonary disease, unspecified: Secondary | ICD-10-CM | POA: Diagnosis not present

## 2012-07-13 DIAGNOSIS — R05 Cough: Secondary | ICD-10-CM

## 2012-07-13 MED ORDER — PREDNISONE (PAK) 10 MG PO TABS: ORAL_TABLET | ORAL | Status: DC

## 2012-07-13 MED ORDER — PANTOPRAZOLE SODIUM 40 MG PO TBEC: 40.0000 mg | DELAYED_RELEASE_TABLET | Freq: Every day | ORAL | Status: DC

## 2012-07-13 MED ORDER — FAMOTIDINE 20 MG PO TABS: ORAL_TABLET | ORAL | Status: DC

## 2012-07-13 NOTE — Assessment & Plan Note (Signed)
The most common causes of chronic cough in immunocompetent adults include the following: upper airway cough syndrome (UACS), previously referred to as postnasal drip syndrome (PNDS), which is caused by variety of rhinosinus conditions; (2) asthma; (3) GERD; (4) chronic bronchitis from cigarette smoking or other inhaled environmental irritants; (5) nonasthmatic eosinophilic bronchitis; and (6) bronchiectasis.   These conditions, singly or in combination, have accounted for up to 94% of the causes of chronic cough in prospective studies.   Other conditions have constituted no >6% of the causes in prospective studies These have included bronchogenic carcinoma, chronic interstitial pneumonia, sarcoidosis, left ventricular failure, ACEI-induced cough, and aspiration from a condition associated with pharyngeal dysfunction.    Chronic cough is often simultaneously caused by more than one condition. A single cause has been found from 38 to 82% of the time, multiple causes from 18 to 62%. Multiply caused cough has been the result of three diseases up to 42% of the time.       Most likely this is  Classic Upper airway cough syndrome, so named because it's frequently impossible to sort out how much is  CR/sinusitis with freq throat clearing (which can be related to primary GERD)   vs  causing  secondary (" extra esophageal")  GERD from wide swings in gastric pressure that occur with throat clearing, often  promoting self use of mint and menthol lozenges that reduce the lower esophageal sphincter tone and exacerbate the problem further in a cyclical fashion.   These are the same pts (now being labeled as having "irritable larynx syndrome" by some cough centers) who not infrequently have a history of having failed to tolerate ace inhibitors,  dry powder inhalers or biphosphonates or report having atypical reflux symptoms that don't respond to standard doses of PPI , and are easily confused as having aecopd or asthma  flares by even experienced allergists/ pulmonologists.  For now max rx for gerd then regroup  Discussed with pt: The standardized cough guidelines published in Chest by [place] in 2006 are still the best available and consist of a multiple step process (up to 12!) , not a single office visit,  and are intended  to address this problem logically,  with an alogrithm dependent on response to empiric treatment at  each progressive step  to determine a specific diagnosis with  minimal addtional testing needed. Therefore if adherence is an issue or can't be accurately verified,  it's very unlikely the standard evaluation and treatment will be successful here.    Furthermore, response to therapy (other than acute cough suppression, which should only be used short term with avoidance of narcotic containing cough syrups if possible), can be a gradual process for which the patient may perceive immediate benefit.  Unlike going to an eye doctor where the best perscription is almost always the first one and is immediately effective, this is almost never the case in the management of chronic cough syndromes. Therefore the patient needs to commit up front to consistently adhere to recommendations  for up to 6 weeks of therapy directed at the likely underlying problem(s) before the response can be reasonably evaluated.

## 2012-07-13 NOTE — Progress Notes (Signed)
Subjective:    Patient ID: Nancy [name], female    DOB: [birth year]MRN: [id number]  HPI  81 yowf quit smoking age 79 c no resp symptoms at all  referred 07/13/2012 by Dr Timothy Lasso for eval of chronic cough.   07/13/2012  1st pulmonary eval/ Jesslynn Kruck cc year round cough present daily x 20 plus years with more sputum production in am x one year > white mucus less than tsp assoc with doe when bending over and picking up things but able work out regularly at the [facility] s difficulty.  No obvious daytime variabilty or assoc  cp or chest tightness, subjective wheeze overt sinus or hb symptoms. No unusual exp hx or h/o childhood pna/ asthma or premature birth to her  knowledge.   Sleeping ok without nocturnal  exacerbations or need for noct saba. Also denies any obvious fluctuation of symptoms with weather or environmental changes or other aggravating or alleviating factors except as outlined above.    Kouffman Reflux v Neurogenic Cough Differentiator Reflux Comments  Do you awaken from a sound sleep coughing violently?                            With trouble breathing? no   Do you have choking episodes when you cannot  Get enough air, gasping for air ?              no   Do you usually cough when you lie down into  The bed, or when you just lie down to rest ?                          no   Do you usually cough after meals or eating?         no   Do you cough when (or after) you bend over?    Assoc c sob   GERD SCORE     Kouffman Reflux v Neurogenic Cough Differentiator Neurogenic   Do you more-or-less cough all day long? yes   Does change of temperature make you cough? no   Does laughing or chuckling cause you to cough? no   Do fumes (perfume, automobile fumes, burned  Toast, etc.,) cause you to cough ?      no   Does speaking, singing, or talking on the phone cause you to cough   ?               hoarse   Neurogenic/Airway score         Review of Systems  Constitutional: Negative for fever, chills  and unexpected weight change.  HENT: Negative for ear pain, nosebleeds, congestion, sore throat, rhinorrhea, sneezing, trouble swallowing, dental problem, voice change, postnasal drip and sinus pressure.   Eyes: Negative for visual disturbance.  Respiratory: Positive for cough and shortness of breath. Negative for choking.   Cardiovascular: Negative for chest pain and leg swelling.  Gastrointestinal: Negative for vomiting, abdominal pain and diarrhea.  Genitourinary: Negative for difficulty urinating.  Musculoskeletal: Negative for arthralgias.  Skin: Negative for rash.  Neurological: Negative for tremors, syncope and headaches.  Hematological: Does not bruise/bleed easily.       Objective:   Physical Exam   Wt Readings from Last 3 Encounters:  07/13/12 138 lb 3.2 oz (62.687 kg)  06/12/12 135 lb (61.236 kg)  05/08/12 136 lb 12.8 oz (62.052 kg)   HEENT: nl dentition, turbinates, and orophanx.  Nl external ear canals without cough reflex   NECK :  without JVD/Nodes/TM/ nl carotid upstrokes bilaterally   LUNGS: no acc muscle use, clear to A and P bilaterally without cough on insp or exp maneuvers   CV:  RRR  no s3 or murmur or increase in P2, no edema   ABD:  soft and nontender with nl excursion in the supine position. No bruits or organomegaly, bowel sounds nl  MS:  warm without deformities, calf tenderness, cyanosis or clubbing  SKIN: warm and dry without lesions    NEURO:  alert, approp, no deficits   CXR  07/13/2012 :   Chronic changes. No active cardiopulmonary disease.      Assessment & Plan:

## 2012-07-13 NOTE — Patient Instructions (Addendum)
Pantoprazole (protonix) 40 mg   Take 30-60 min before first meal of the day and Pepcid 20 mg one bedtime until return to office - this is the best way to tell whether stomach acid is contributing to your problem.    GERD (REFLUX)  is an extremely common cause of respiratory symptoms, many times with no significant heartburn at all.    It can be treated with medication, but also with lifestyle changes including avoidance of late meals, excessive alcohol, smoking cessation, and avoid fatty foods, chocolate, peppermint, colas, red wine, and acidic juices such as orange juice.  NO MINT OR MENTHOL PRODUCTS SO NO COUGH DROPS  USE SUGARLESS CANDY INSTEAD (jolley ranchers or Stover's)  NO OIL BASED VITAMINS - use powdered substitutes.   Prednisone 10 mg take  4 each am x 2 days,   2 each am x 2 days,  1 each am x2days and stop   Please remember to go to the lab and x-ray department downstairs for your tests - we will call you with the results when they are available.     Please schedule a follow up office visit in 4 weeks, sooner if needed

## 2012-07-14 ENCOUNTER — Telehealth: Payer: Self-pay | Admitting: Internal Medicine

## 2012-07-14 NOTE — Telephone Encounter (Signed)
Spoke with pt and informed of cxr results per Dr Sherene Sires

## 2012-08-01 ENCOUNTER — Other Ambulatory Visit: Payer: Self-pay | Admitting: Emergency Medicine

## 2012-08-01 MED ORDER — RIVAROXABAN 15 MG PO TABS: 15.0000 mg | ORAL_TABLET | Freq: Every day | ORAL | Status: DC

## 2012-08-11 ENCOUNTER — Encounter: Payer: Self-pay | Admitting: Internal Medicine

## 2012-08-11 ENCOUNTER — Ambulatory Visit (INDEPENDENT_AMBULATORY_CARE_PROVIDER_SITE_OTHER): Payer: Medicare Other | Admitting: Internal Medicine

## 2012-08-11 VITALS — BP 122/66 | HR 80 | Temp 96.9°F | Ht 66.0 in | Wt 138.0 lb

## 2012-08-11 DIAGNOSIS — R05 Cough: Secondary | ICD-10-CM | POA: Diagnosis not present

## 2012-08-11 MED ORDER — TRAMADOL HCL 50 MG PO TABS: ORAL_TABLET | ORAL | Status: DC

## 2012-08-11 NOTE — Patient Instructions (Addendum)
Continue Pantoprazole (protonix) 40 mg   Take 30-60 min before first meal of the day and Pepcid 20 mg one bedtime until cough is gone, then stop  Start chlortrimeton 4 mg every 6 hours as needed for cough  GERD (REFLUX)  is an extremely common cause of respiratory symptoms, many times with no significant heartburn at all.    It can be treated with medication, but also with lifestyle changes including avoidance of late meals, excessive alcohol, smoking cessation, and avoid fatty foods, chocolate, peppermint, colas, red wine, and acidic juices such as orange juice.  NO MINT OR MENTHOL PRODUCTS SO NO COUGH DROPS  USE SUGARLESS CANDY INSTEAD (jolley ranchers or Stover's)  NO OIL BASED VITAMINS - use powdered substitutes.  If none of this effective the next step  is 3 days of heavy cough suppression : Take delsym two tsp every 12 hours and supplement if needed with  tramadol 50 mg up to 2 every 4 hours to suppress the urge to cough. Swallowing water or using ice chips/non mint and menthol containing candies (such as lifesavers or sugarless jolly ranchers) are also effective.  You should rest your voice and avoid activities that you know make you cough.  Once you have eliminated the cough for 3 straight days try reducing the tramadol first,  then the delsym as tolerated.    Please schedule a follow up office visit in 4 weeks, sooner if needed

## 2012-08-11 NOTE — Progress Notes (Signed)
Subjective:    Patient ID: Nancy [name], female    DOB: Apr 09, 1925   MRN: [id number]   Brief patient profile:  79 yowf quit smoking age 79 c no resp symptoms at all  referred 77/01/2013 by Dr Timothy Lasso for eval of chronic cough.   07/13/2012  1st pulmonary eval/ Adarryl Goldammer cc year round cough present daily x 20 plus years with more sputum production in am x one year > white mucus less than tsp assoc with doe when bending over and picking up things but able work out regularly at the [facility] s difficulty. rec gerd rx/ diet/ pred x 6 days  08/11/2012 f/u ov/Lilibeth Opie f/u cough Chief Complaint  Patient presents with  . Follow-up    Cough is the same- no better or worse, no new co's today.   has classic voice fatigue and some watery rhinorrhea. No trouble at all sleeping    Kouffman Reflux v Neurogenic Cough Differentiator Reflux Comments  Do you awaken from a sound sleep coughing violently?                            With trouble breathing? no   Do you have choking episodes when you cannot  Get enough air, gasping for air ?              occ   Do you usually cough when you lie down into  The bed, or when you just lie down to rest ?                          no   Do you usually cough after meals or eating?         no   Do you cough when (or after) you bend over?     sob not cough   GERD SCORE     Kouffman Reflux v Neurogenic Cough Differentiator Neurogenic   Do you more-or-less cough all day long? yes   Does change of temperature make you cough? no   Does laughing or chuckling cause you to cough? no   Do fumes (perfume, automobile fumes, burned  Toast, etc.,) cause you to cough ?      no   Does speaking, singing, or talking on the phone cause you to cough   ?               hoarse   Neurogenic/Airway score        Sleeping ok without nocturnal  or early am exacerbation  of respiratory  c/o's or need for noct saba. Also denies any obvious fluctuation of symptoms with weather or environmental changes or  other aggravating or alleviating factors except as outlined above   Current Medications, Allergies, Past Medical History, Past Surgical History, Family History, and Social History were reviewed in [employer] record.  ROS  The following are not active complaints unless bolded sore throat, dysphagia, dental problems, itching, sneezing,  nasal congestion or excess/ purulent secretions, ear ache,   fever, chills, sweats, unintended wt loss, pleuritic or exertional cp, hemoptysis,  orthopnea pnd or leg swelling, presyncope, palpitations, heartburn, abdominal pain, anorexia, nausea, vomiting, diarrhea  or change in bowel or urinary habits, change in stools or urine, dysuria,hematuria,  rash, arthralgias, visual complaints, headache, numbness weakness or ataxia or problems with walking or coordination,  change in mood/affect or memory.  Objective:   Physical Exam  08/11/2012         138  Wt Readings from Last 3 Encounters:  07/13/12 138 lb 3.2 oz (62.687 kg)  06/12/12 135 lb (61.236 kg)  05/08/12 136 lb 12.8 oz (62.052 kg)   HEENT: nl dentition, turbinates, and orophanx. Nl external ear canals without cough reflex   NECK :  without JVD/Nodes/TM/ nl carotid upstrokes bilaterally   LUNGS: no acc muscle use, clear to A and P bilaterally without cough on insp or exp maneuvers   CV:  RRR  no s3 or murmur or increase in P2, no edema   ABD:  soft and nontender with nl excursion in the supine position. No bruits or organomegaly, bowel sounds nl  MS:  warm without deformities, calf tenderness, cyanosis or clubbing       CXR  07/13/2012 :   Chronic changes. No active cardiopulmonary disease.      Assessment & Plan:

## 2012-08-12 NOTE — Assessment & Plan Note (Signed)
All aspects of this cough strongly point to  Classic Upper airway cough syndrome, so named because it's frequently impossible to sort out how much is  CR/sinusitis with freq throat clearing (which can be related to primary GERD)   vs  causing  secondary (" extra esophageal")  GERD from wide swings in gastric pressure that occur with throat clearing, often  promoting self use of mint and menthol lozenges that reduce the lower esophageal sphincter tone and exacerbate the problem further in a cyclical fashion.   These are the same pts (now being labeled as having "irritable larynx syndrome" by some cough centers) who not infrequently have a history of having failed to tolerate ace inhibitors,  dry powder inhalers or biphosphonates or report having atypical reflux symptoms that don't respond to standard doses of PPI , and are easily confused as having aecopd or asthma flares by even experienced allergists/ pulmonologists.   Will add h1 per guidelines and add cyclical cough regimen to see if this eliminates the cough.  Reviewed cough approach in detail > next step is consider neurontin

## 2012-08-29 DIAGNOSIS — L259 Unspecified contact dermatitis, unspecified cause: Secondary | ICD-10-CM | POA: Diagnosis not present

## 2012-09-13 DIAGNOSIS — L259 Unspecified contact dermatitis, unspecified cause: Secondary | ICD-10-CM | POA: Diagnosis not present

## 2012-09-18 ENCOUNTER — Ambulatory Visit: Payer: Medicare Other | Admitting: Internal Medicine

## 2012-09-26 DIAGNOSIS — R42 Dizziness and giddiness: Secondary | ICD-10-CM | POA: Diagnosis not present

## 2012-09-26 DIAGNOSIS — J45909 Unspecified asthma, uncomplicated: Secondary | ICD-10-CM | POA: Diagnosis not present

## 2012-10-04 ENCOUNTER — Encounter: Payer: Self-pay | Admitting: Internal Medicine

## 2012-10-04 ENCOUNTER — Ambulatory Visit (INDEPENDENT_AMBULATORY_CARE_PROVIDER_SITE_OTHER): Payer: Medicare Other | Admitting: Internal Medicine

## 2012-10-04 VITALS — BP 130/70 | HR 59 | Temp 98.2°F | Ht 66.0 in | Wt 132.6 lb

## 2012-10-04 DIAGNOSIS — R05 Cough: Secondary | ICD-10-CM

## 2012-10-04 MED ORDER — FAMOTIDINE 20 MG PO TABS: ORAL_TABLET | ORAL | Status: DC

## 2012-10-04 MED ORDER — PANTOPRAZOLE SODIUM 40 MG PO TBEC: 40.0000 mg | DELAYED_RELEASE_TABLET | Freq: Every day | ORAL | Status: DC

## 2012-10-04 MED ORDER — PREDNISONE (PAK) 10 MG PO TABS: ORAL_TABLET | ORAL | Status: DC

## 2012-10-04 NOTE — Progress Notes (Signed)
Subjective:    Patient ID: Nancy [name], female    DOB: 1925/05/05   MRN: [id number]   Brief patient profile:  36 yowf quit smoking age 79 c no resp symptoms at all  referred 07/13/2012 by Dr Timothy Lasso for eval of chronic cough.   07/13/2012  1st pulmonary eval/ Nancy [name] cc year round cough present daily x 20 plus years with more sputum production in am x one year > white mucus less than tsp assoc with doe when bending over and picking up things but able work out regularly at the [facility] s difficulty. rec gerd rx/ diet/ pred x 6 days  08/11/2012 f/u ov/Nancy [name] f/u cough Chief Complaint  Patient presents with  . Follow-up    Cough is the same- no better or worse, no new co's today.   has classic voice fatigue and some watery rhinorrhea. No trouble at all sleeping  rec Continue Pantoprazole (protonix) 40 mg   Take 30-60 min before first meal of the day and Pepcid 20 mg one bedtime until cough is gone, then stop Start chlortrimeton 4 mg every 6 hours as needed for cough GERD diet  10/04/2012 f/u ov/Nancy [name] stopped gerd rx when rx ran out Chief Complaint  Patient presents with  . Follow-up    Pt c/o increased cough x 4 days- prod with moderate white sputum.   does not remember whether prev rec meds helped or not or even if she took them as instructed, cough is worse p stirs in am, not while supine, min productive and no purulence.    No obvious daytime variabilty or assoc sob  or cp or chest tightness, subjective wheeze overt sinus or hb symptoms. No unusual exp hx or h/o childhood pna/ asthma or knowledge of premature birth.    Sleeping ok without nocturnal  or early am exacerbation  of respiratory  c/o's or need for noct saba. Also denies any obvious fluctuation of symptoms with weather or environmental changes or other aggravating or alleviating factors except as outlined above   Current Medications, Allergies, Past Medical History, Past Surgical History, Family History, and Social History were reviewed  in [employer] record.  ROS  The following are not active complaints unless bolded sore throat, dysphagia, dental problems, itching, sneezing,  nasal congestion or excess/ purulent secretions, ear ache,   fever, chills, sweats, unintended wt loss, pleuritic or exertional cp, hemoptysis,  orthopnea pnd or leg swelling, presyncope, palpitations, heartburn, abdominal pain, anorexia, nausea, vomiting, diarrhea  or change in bowel or urinary habits, change in stools or urine, dysuria,hematuria,  rash, arthralgias, visual complaints, headache, numbness weakness or ataxia or problems with walking or coordination,  change in mood/affect or memory.            Objective:   Physical Exam  amb wf with hopeless affect, prominent pseudowheeze  08/11/2012         138 > 10/04/2012 132  Wt Readings from Last 3 Encounters:  07/13/12 138 lb 3.2 oz (62.687 kg)  06/12/12 135 lb (61.236 kg)  05/08/12 136 lb 12.8 oz (62.052 kg)   HEENT: nl dentition, turbinates, and orophanx. Nl external ear canals without cough reflex   NECK :  without JVD/Nodes/TM/ nl carotid upstrokes bilaterally   LUNGS: no acc muscle use,  A few insp and exp rhonchi bilaterally    CV:  RRR  no s3 or murmur or increase in P2, no edema   ABD:  soft and nontender with nl excursion in  the supine position. No bruits or organomegaly, bowel sounds nl  MS:  warm without deformities, calf tenderness, cyanosis or clubbing          cxr 07/14/12  Chronic changes. No active cardiopulmonary disease.      Assessment & Plan:

## 2012-10-04 NOTE — Patient Instructions (Addendum)
Continue Pantoprazole (protonix) 40 mg   Take 30-60 min before first meal of the day and Pepcid 20 mg one at  Bedtime until return to office   Start chlortrimeton 4 mg every 6 hours as needed for allergies, nasal drainage or cough/throat congestion  GERD (REFLUX)  is an extremely common cause of respiratory symptoms, many times with no significant heartburn at all.    It can be treated with medication, but also with lifestyle changes including avoidance of late meals, excessive alcohol, smoking cessation, and avoid fatty foods, chocolate, peppermint, colas, red wine, and acidic juices such as orange juice.  NO MINT OR MENTHOL PRODUCTS SO NO COUGH DROPS  USE SUGARLESS CANDY INSTEAD (jolley ranchers or Stover's)  NO OIL BASED VITAMINS - use powdered substitutes.  Prednisone 10 mg take  4 each am x 2 days,   2 each am x 2 days,  1 each am x 2 days and stop   For cough use delsym 2 tsp every 12 hours as needed   Plus ok to supplement with tramadol 50 mg every 4 hours if needed just for the next few days   See Tammy NP w/in 2 weeks with all your medications, even over the counter meds, separated in two separate bags, the ones you take no matter what vs the ones you stop once you feel better and take only as needed when you feel you need them.   Tammy  will generate for you a new user friendly medication calendar that will put [place] all on the same page re: your medication use.     Without this process, it simply isn't possible to assure that we are providing  your outpatient care  with  the attention to detail we feel you deserve.   If we cannot assure that you're getting that kind of care,  then we cannot manage your problem effectively from this clinic.  Once you have seen Tammy and we are sure that we're all on the same page with your medication use she will arrange follow up with me.

## 2012-10-07 NOTE — Assessment & Plan Note (Signed)
I had an extended discussion with the patient today lasting 15 to 20 minutes of a 25 minute visit on the following issues:   The standardized cough guidelines published in Chest by [place] in 2006 are still the best available and consist of a multiple step process (up to 12!) , not a single office visit,  and are intended  to address this problem logically,  with an alogrithm dependent on response to empiric treatment at  each progressive step  to determine a specific diagnosis with  minimal addtional testing needed. Therefore if adherence is an issue or can't be accurately verified,  it's very unlikely the standard evaluation and treatment will be successful here.    Furthermore, response to therapy (other than acute cough suppression, which should only be used short term with avoidance of narcotic containing cough syrups if possible), can be a gradual process for which the patient may perceive immediate benefit.  Unlike going to an eye doctor where the best perscription is almost always the first one and is immediately effective, this is almost never the case in the management of chronic cough syndromes. Therefore the patient needs to commit up front to consistently adhere to recommendations  for up to 6 weeks of therapy directed at the likely underlying problem(s) before the response can be reasonably evaluated.   The first step is to restart adequate gerd rx and then do med reconciliation using a trust but verify approach before considering additional steps.  See instructions for specific recommendations which were reviewed directly with the patient who was given a copy with highlighter outlining the key components.

## 2012-10-24 ENCOUNTER — Ambulatory Visit (INDEPENDENT_AMBULATORY_CARE_PROVIDER_SITE_OTHER): Payer: Medicare Other | Admitting: Adult Health

## 2012-10-24 ENCOUNTER — Encounter: Payer: Self-pay | Admitting: Adult Health

## 2012-10-24 VITALS — BP 158/84 | HR 61 | Temp 98.0°F | Ht 66.0 in | Wt 132.0 lb

## 2012-10-24 DIAGNOSIS — R05 Cough: Secondary | ICD-10-CM

## 2012-10-24 NOTE — Patient Instructions (Addendum)
Follow med calendar closely and bring to each visit .  follow up Dr. Sherene Sires  In 3-4 months and As needed

## 2012-10-26 NOTE — Assessment & Plan Note (Addendum)
Improving on regimen and add, reflux, and rhinitis prevention. Patient's medications were reviewed today and patient education was given. Computerized medication calendar was adjusted/completed   Plan  Follow med calendar closely and bring to each visit .  follow up Dr. Sherene Sires  In 3-4 months and As needed

## 2012-10-26 NOTE — Progress Notes (Signed)
Subjective:    Patient ID: Nancy [name], female    DOB: Dec 02, 1925   MRN: [id number]  Brief patient profile:  39 yowf quit smoking age 79 c no resp symptoms at all  referred 07/13/2012 by Dr Timothy Lasso for eval of chronic cough.  07/13/2012  1st pulmonary eval/ [facility]ert cc year round cough present daily x 20 plus years with more sputum production in am x one year > white mucus less than tsp assoc with doe when bending over and picking up things but able work out regularly at the [facility] s difficulty. rec gerd rx/ diet/ pred x 6 days  08/11/2012 f/u ov/Wert f/u cough Chief Complaint  Patient presents with  . Follow-up    Cough is the same- no better or worse, no new co's today.   has classic voice fatigue and some watery rhinorrhea. No trouble at all sleeping  rec Continue Pantoprazole (protonix) 40 mg   Take 30-60 min before first meal of the day and Pepcid 20 mg one bedtime until cough is gone, then stop Start chlortrimeton 4 mg every 6 hours as needed for cough GERD diet  10/04/2012 f/u ov/Wert stopped gerd rx when rx ran out Chief Complaint  Patient presents with  . Follow-up    Pt c/o increased cough x 4 days- prod with moderate white sputum.   does not remember whether prev rec meds helped or not or even if she took them as instructed, cough is worse p stirs in am, not while supine, min productive and no purulence.    >>pred taper   10/26/2012 Follow up and med review Patient returns for a two-week followup and medication review. We reviewed all her medications and organized them into a medication calendar with patient education. It appears the patient is taking her medications correctly.  patient was seen 2 weeks ago and given a prednisone taper. She reports that her cough is some better. Patient denies any hemoptysis, orthopnea, PND, or leg swelling.  Current Medications, Allergies, Past Medical History, Past Surgical History, Family History, and Social History were reviewed in [employer] record.  ROS  The following are not active complaints unless bolded sore throat, dysphagia, dental problems, itching, sneezing,  nasal congestion or excess/ purulent secretions, ear ache,   fever, chills, sweats, unintended wt loss, pleuritic or exertional cp, hemoptysis,  orthopnea pnd or leg swelling, presyncope, palpitations, heartburn, abdominal pain, anorexia, nausea, vomiting, diarrhea  or change in bowel or urinary habits, change in stools or urine, dysuria,hematuria,  rash, arthralgias, visual complaints, headache, numbness weakness or ataxia or problems with walking or coordination,  change in mood/affect or memory.            Objective:   Physical Exam  amb wf   08/11/2012         138 > 10/04/2012 132  >132 10/26/2012   HEENT: nl dentition, turbinates, and orophanx. Nl external ear canals without cough reflex   NECK :  without JVD/Nodes/TM/ nl carotid upstrokes bilaterally   LUNGS: no acc muscle use,  A few insp and exp rhonchi bilaterally    CV:  RRR  no s3 or murmur or increase in P2, no edema   ABD:  soft and nontender with nl excursion in the supine position. No bruits or organomegaly, bowel sounds nl  MS:  warm without deformities, calf tenderness, cyanosis or clubbing          cxr 07/14/12  Chronic changes. No active cardiopulmonary disease.  Assessment & Plan:

## 2012-10-31 NOTE — Addendum Note (Signed)
Addended by: Boone Master E on: 10/31/2012 08:57 AM   Modules accepted: Orders

## 2012-11-02 ENCOUNTER — Telehealth: Payer: Self-pay | Admitting: Internal Medicine

## 2012-11-02 NOTE — Telephone Encounter (Signed)
ATC pt NA not able to leave VM. WCB

## 2012-11-02 NOTE — Telephone Encounter (Signed)
Dr. Sherene Sires please advise if you want to do PA on pt famotidine and pantoprazole. Or any changes to these meds? thanks

## 2012-11-02 NOTE — Telephone Encounter (Signed)
Should be able to find resonable formulary alternatives for each like generic prilosec and pepcid/zantac

## 2012-11-03 NOTE — Telephone Encounter (Signed)
ATC, NA, no voicemail. WCB. Jennifer Castillo, CMA  

## 2012-11-06 ENCOUNTER — Ambulatory Visit (HOSPITAL_COMMUNITY): Payer: Medicare Other | Attending: Internal Medicine | Admitting: Radiology

## 2012-11-06 ENCOUNTER — Ambulatory Visit (INDEPENDENT_AMBULATORY_CARE_PROVIDER_SITE_OTHER): Payer: Medicare Other | Admitting: Nurse Practitioner

## 2012-11-06 ENCOUNTER — Encounter: Payer: Self-pay | Admitting: Nurse Practitioner

## 2012-11-06 VITALS — BP 140/70 | HR 84 | Ht 66.0 in | Wt 128.8 lb

## 2012-11-06 DIAGNOSIS — I379 Nonrheumatic pulmonary valve disorder, unspecified: Secondary | ICD-10-CM | POA: Insufficient documentation

## 2012-11-06 DIAGNOSIS — R0609 Other forms of dyspnea: Secondary | ICD-10-CM | POA: Diagnosis not present

## 2012-11-06 DIAGNOSIS — R0602 Shortness of breath: Secondary | ICD-10-CM

## 2012-11-06 DIAGNOSIS — I059 Rheumatic mitral valve disease, unspecified: Secondary | ICD-10-CM | POA: Diagnosis not present

## 2012-11-06 DIAGNOSIS — I480 Paroxysmal atrial fibrillation: Secondary | ICD-10-CM

## 2012-11-06 DIAGNOSIS — F17200 Nicotine dependence, unspecified, uncomplicated: Secondary | ICD-10-CM | POA: Diagnosis not present

## 2012-11-06 DIAGNOSIS — I079 Rheumatic tricuspid valve disease, unspecified: Secondary | ICD-10-CM | POA: Diagnosis not present

## 2012-11-06 DIAGNOSIS — Z79899 Other long term (current) drug therapy: Secondary | ICD-10-CM | POA: Diagnosis not present

## 2012-11-06 DIAGNOSIS — I4891 Unspecified atrial fibrillation: Secondary | ICD-10-CM | POA: Diagnosis not present

## 2012-11-06 DIAGNOSIS — R0989 Other specified symptoms and signs involving the circulatory and respiratory systems: Secondary | ICD-10-CM | POA: Insufficient documentation

## 2012-11-06 DIAGNOSIS — E785 Hyperlipidemia, unspecified: Secondary | ICD-10-CM | POA: Diagnosis not present

## 2012-11-06 DIAGNOSIS — R0600 Dyspnea, unspecified: Secondary | ICD-10-CM

## 2012-11-06 LAB — CBC WITH DIFFERENTIAL/PLATELET
Basophils Absolute: 0 10*3/uL (ref 0.0–0.1)
Basophils Relative: 0.3 % (ref 0.0–3.0)
Eosinophils Absolute: 0.2 10*3/uL (ref 0.0–0.7)
Eosinophils Relative: 3 % (ref 0.0–5.0)
HCT: 41.2 % (ref 36.0–46.0)
Hemoglobin: 13.6 g/dL (ref 12.0–15.0)
Lymphocytes Relative: 15.7 % (ref 12.0–46.0)
Lymphs Abs: 1.3 10*3/uL (ref 0.7–4.0)
MCHC: 32.9 g/dL (ref 30.0–36.0)
MCV: 93.2 fl (ref 78.0–100.0)
Monocytes Absolute: 0.5 10*3/uL (ref 0.1–1.0)
Monocytes Relative: 6.6 % (ref 3.0–12.0)
Neutro Abs: 6.1 10*3/uL (ref 1.4–7.7)
Neutrophils Relative %: 74.4 % (ref 43.0–77.0)
Platelets: 394 10*3/uL (ref 150.0–400.0)
RBC: 4.41 Mil/uL (ref 3.87–5.11)
RDW: 14 % (ref 11.5–14.6)
WBC: 8.2 10*3/uL (ref 4.5–10.5)

## 2012-11-06 LAB — BASIC METABOLIC PANEL
BUN: 15 mg/dL (ref 6–23)
CO2: 30 mEq/L (ref 19–32)
Calcium: 9.5 mg/dL (ref 8.4–10.5)
Chloride: 103 mEq/L (ref 96–112)
Creatinine, Ser: 1.4 mg/dL — ABNORMAL HIGH (ref 0.4–1.2)
GFR: 37.07 mL/min — ABNORMAL LOW (ref >60.00)
Glucose, Bld: 124 mg/dL — ABNORMAL HIGH (ref 70–99)
Potassium: 3.6 mEq/L (ref 3.5–5.1)
Sodium: 140 mEq/L (ref 135–145)

## 2012-11-06 LAB — TSH: TSH: 0.75 u[IU]/mL (ref 0.35–5.50)

## 2012-11-06 LAB — BRAIN NATRIURETIC PEPTIDE: Pro B Natriuretic peptide (BNP): 185 pg/mL — ABNORMAL HIGH (ref 0.0–100.0)

## 2012-11-06 NOTE — Telephone Encounter (Signed)
Called, spoke with pt.  She will call her insurance co to inquire about formulary alternatives for each med.  She will call us back with this information. Will await call.

## 2012-11-06 NOTE — Progress Notes (Signed)
Echocardiogram performed.  

## 2012-11-06 NOTE — Progress Notes (Signed)
Nancy [name] Date of Birth: 21-Nov-1925 Medical Record #[id number]  History of Present Illness: Nancy [name] is seen back today for a 6 month check. Seen for Dr. Johney Frame. Has a history of PAF, depression, melanoma, HLD, GERD and scarlet fever. On chronic amiodarone therapy and anticoagulated with Xarelto. Followed by [name] for her primary care.   Last seen in February and was felt to be doing ok.   Comes back today. Here alone. Says she is not doing well. Very poor historian and admits that she does not really remember things very well. Does report that on Mother's Day at church that she fainted - she really does not remember any of the details. Drank some water and then went home. Did not seek any evaluation. Notes that her breathing is getting worse - was "bad" this morning. Still with this chronic cough - has seen pulmonary - no real help noted. Some palpitations at times. Denies chest pain. No longer playing tennis but goes to an exercise class 4 x per week. She tries to stay very active. No more "fainting" spells. She notes that she is losing weight - really does not know why.    Current Outpatient Prescriptions  Medication Sig Dispense Refill  . amiodarone (PACERONE) 200 MG tablet Take 1 tablet (200 mg total) by mouth daily.  30 tablet  5  . calcium-vitamin D (OSCAL WITH D 500-200) 500-200 MG-UNIT per tablet Take 1 tablet by mouth daily.        . Coenzyme Q10 (CO Q 10) 100 MG CAPS Take 1 capsule by mouth daily.       . famotidine (PEPCID) 20 MG tablet One at bedtime  30 tablet  2  . Multiple Vitamins-Minerals (CENTRUM SILVER PO) Take 1 tablet by mouth daily.      . pantoprazole (PROTONIX) 40 MG tablet Take 1 tablet 30-60 min before first meal of the day      . pyridOXINE (VITAMIN B-6) 100 MG tablet Take 100 mg by mouth daily.        . Rivaroxaban (XARELTO) 15 MG TABS tablet Take 1 tablet (15 mg total) by mouth daily.  30 tablet  5  . traMADol (ULTRAM) 50 MG tablet 1-2 every 4 hours as  needed for cough or pain  40 tablet  0   No current facility-administered medications for this visit.    No Known Allergies  Past Medical History  Diagnosis Date  . Migraine   . GERD (gastroesophageal reflux disease)   . Dyslipidemia   . History of scarlet fever   . PAF (paroxysmal atrial fibrillation)   . Depression   . Melanoma     Past Surgical History  Procedure Laterality Date  . Tonsillectomy    . Appendectomy    . Cataract extraction      bilateral  . Cesarean section      History  Smoking status  . Former Smoker -- 6 years  . Types: Cigarettes  . Quit date: 09/09/1967  Smokeless tobacco  . Not on file    Comment: pt reports she smoked socially    History  Alcohol Use  . 0.6 oz/week  . 1 Glasses of wine per week    Family History  Problem Relation Age of Onset  . Colon polyps      Review of Systems: The review of systems is per the HPI.  All other systems were reviewed and are negative.  Physical Exam: BP 140/70  Pulse 84  Ht 5\' 6"  (1.676 m)  Wt 128 lb 12.8 oz (58.423 kg)  BMI 20.8 kg/m2 Patient is very pleasant and in no acute distress. Weight is down 8 pounds since her last visit here from February. She looks younger than her stated age. Skin is warm and dry. Color is normal.  HEENT is unremarkable. Normocephalic/atraumatic. PERRL. Sclera are nonicteric. Neck is supple. No masses. No JVD. Lungs are clear. Cardiac exam shows a regular rate and rhythm. Abdomen is soft. Extremities are without edema. Gait and ROM are intact. No gross neurologic deficits noted.  LABORATORY DATA: EKG today shows sinus rhythm with diffuse ST/T wave changes - septal Q's - tracing is unchanged.   Lab Results  Component Value Date   WBC 6.4 05/08/2012   HGB 13.0 05/08/2012   HCT 38.7 05/08/2012   PLT 307.0 05/08/2012   GLUCOSE 85 05/08/2012   ALT 23 06/08/2010   AST 32 06/08/2010   NA 140 05/08/2012   K 3.9 05/08/2012   CL 103 05/08/2012   CREATININE 1.3* 05/08/2012   BUN 13  05/08/2012   CO2 31 05/08/2012   TSH 1.14 05/08/2012    Assessment / Plan: 1. PAF/flutter - on amiodarone - she is in sinus today - but endorses palpitations. Needs follow up labs with her amiodarone as well as PFTs with diffusion.   2. Anticoagulation with Xarelto - no problems noted.   3. Dyspnea - progressive - check PFTs with diffusion with her history of amiodarone. Last CXR was back in April. Checking labs today as well to include BNP. Will update her echo as well.   I will get her back to see Dr. Johney Frame in 2 months.    Patient is agreeable to this plan and will call if any problems develop in the interim.   Rosalio Macadamia, RN, ANP-C [facility] [address] [place], [place]  [address]

## 2012-11-06 NOTE — Patient Instructions (Addendum)
Stay on your current medicines  We are checking labs today  We are going to update the ultrasound of your heart  We are going to send you for a breathing test  See Dr. Johney Frame in 2 months  Call the [facility] Care office at [phone] if you have any questions, problems or concerns.

## 2012-11-08 NOTE — Telephone Encounter (Signed)
lmomtcb to follow up on this with pt.

## 2012-11-08 NOTE — Telephone Encounter (Signed)
Spoke with patient- Patient states both medications are $2 a piece so she does not need a PA for these Nothing further needed from this patient at this time

## 2012-12-12 DIAGNOSIS — M5137 Other intervertebral disc degeneration, lumbosacral region: Secondary | ICD-10-CM | POA: Diagnosis not present

## 2012-12-12 DIAGNOSIS — M25559 Pain in unspecified hip: Secondary | ICD-10-CM | POA: Diagnosis not present

## 2012-12-12 DIAGNOSIS — M999 Biomechanical lesion, unspecified: Secondary | ICD-10-CM | POA: Diagnosis not present

## 2012-12-13 DIAGNOSIS — M25559 Pain in unspecified hip: Secondary | ICD-10-CM | POA: Diagnosis not present

## 2012-12-13 DIAGNOSIS — M999 Biomechanical lesion, unspecified: Secondary | ICD-10-CM | POA: Diagnosis not present

## 2012-12-13 DIAGNOSIS — M5137 Other intervertebral disc degeneration, lumbosacral region: Secondary | ICD-10-CM | POA: Diagnosis not present

## 2012-12-14 DIAGNOSIS — M25559 Pain in unspecified hip: Secondary | ICD-10-CM | POA: Diagnosis not present

## 2012-12-14 DIAGNOSIS — M5137 Other intervertebral disc degeneration, lumbosacral region: Secondary | ICD-10-CM | POA: Diagnosis not present

## 2012-12-14 DIAGNOSIS — M999 Biomechanical lesion, unspecified: Secondary | ICD-10-CM | POA: Diagnosis not present

## 2012-12-18 DIAGNOSIS — M999 Biomechanical lesion, unspecified: Secondary | ICD-10-CM | POA: Diagnosis not present

## 2012-12-18 DIAGNOSIS — M5137 Other intervertebral disc degeneration, lumbosacral region: Secondary | ICD-10-CM | POA: Diagnosis not present

## 2012-12-18 DIAGNOSIS — M25559 Pain in unspecified hip: Secondary | ICD-10-CM | POA: Diagnosis not present

## 2012-12-19 DIAGNOSIS — M999 Biomechanical lesion, unspecified: Secondary | ICD-10-CM | POA: Diagnosis not present

## 2012-12-19 DIAGNOSIS — M25559 Pain in unspecified hip: Secondary | ICD-10-CM | POA: Diagnosis not present

## 2012-12-19 DIAGNOSIS — M5137 Other intervertebral disc degeneration, lumbosacral region: Secondary | ICD-10-CM | POA: Diagnosis not present

## 2012-12-25 DIAGNOSIS — M999 Biomechanical lesion, unspecified: Secondary | ICD-10-CM | POA: Diagnosis not present

## 2012-12-25 DIAGNOSIS — M5137 Other intervertebral disc degeneration, lumbosacral region: Secondary | ICD-10-CM | POA: Diagnosis not present

## 2012-12-25 DIAGNOSIS — M25559 Pain in unspecified hip: Secondary | ICD-10-CM | POA: Diagnosis not present

## 2012-12-26 DIAGNOSIS — M5137 Other intervertebral disc degeneration, lumbosacral region: Secondary | ICD-10-CM | POA: Diagnosis not present

## 2012-12-26 DIAGNOSIS — M999 Biomechanical lesion, unspecified: Secondary | ICD-10-CM | POA: Diagnosis not present

## 2012-12-26 DIAGNOSIS — M25559 Pain in unspecified hip: Secondary | ICD-10-CM | POA: Diagnosis not present

## 2012-12-28 DIAGNOSIS — M25559 Pain in unspecified hip: Secondary | ICD-10-CM | POA: Diagnosis not present

## 2012-12-28 DIAGNOSIS — M5137 Other intervertebral disc degeneration, lumbosacral region: Secondary | ICD-10-CM | POA: Diagnosis not present

## 2012-12-28 DIAGNOSIS — M999 Biomechanical lesion, unspecified: Secondary | ICD-10-CM | POA: Diagnosis not present

## 2013-01-01 DIAGNOSIS — IMO0002 Reserved for concepts with insufficient information to code with codable children: Secondary | ICD-10-CM | POA: Diagnosis not present

## 2013-01-01 DIAGNOSIS — M999 Biomechanical lesion, unspecified: Secondary | ICD-10-CM | POA: Diagnosis not present

## 2013-01-01 DIAGNOSIS — M25559 Pain in unspecified hip: Secondary | ICD-10-CM | POA: Diagnosis not present

## 2013-01-01 DIAGNOSIS — M5137 Other intervertebral disc degeneration, lumbosacral region: Secondary | ICD-10-CM | POA: Diagnosis not present

## 2013-01-02 DIAGNOSIS — M5137 Other intervertebral disc degeneration, lumbosacral region: Secondary | ICD-10-CM | POA: Diagnosis not present

## 2013-01-02 DIAGNOSIS — M999 Biomechanical lesion, unspecified: Secondary | ICD-10-CM | POA: Diagnosis not present

## 2013-01-02 DIAGNOSIS — IMO0002 Reserved for concepts with insufficient information to code with codable children: Secondary | ICD-10-CM | POA: Diagnosis not present

## 2013-01-02 DIAGNOSIS — M25559 Pain in unspecified hip: Secondary | ICD-10-CM | POA: Diagnosis not present

## 2013-01-03 ENCOUNTER — Encounter: Payer: Self-pay | Admitting: Internal Medicine

## 2013-01-03 ENCOUNTER — Ambulatory Visit (INDEPENDENT_AMBULATORY_CARE_PROVIDER_SITE_OTHER): Payer: Medicare Other | Admitting: Internal Medicine

## 2013-01-03 VITALS — BP 136/69 | HR 67 | Ht 66.0 in | Wt 130.0 lb

## 2013-01-03 DIAGNOSIS — I4891 Unspecified atrial fibrillation: Secondary | ICD-10-CM | POA: Diagnosis not present

## 2013-01-03 MED ORDER — AMIODARONE HCL 200 MG PO TABS: 100.0000 mg | ORAL_TABLET | Freq: Every day | ORAL | Status: DC

## 2013-01-03 NOTE — Progress Notes (Signed)
      PCP: Gwen Pounds, MD Primary Cardiologist:  Previously Dr Gala Romney  Nancy [name] is a 79 y.o. female who presents today for routine electrophysiology followup.  Since last being seen in our clinic, the patient reports doing very well.   She remains in sinus rhythm.  She has chronic cough but denies significant SOB.  PFTs have been ordered but she has not had them done yet. Today, she denies symptoms of palpitations, chest pain,   lower extremity edema, presyncope, or syncope.  She has occasional postural dizziness.  The patient is otherwise without complaint today.   Past Medical History  Diagnosis Date  . Migraine   . GERD (gastroesophageal reflux disease)   . Dyslipidemia   . History of scarlet fever   . PAF (paroxysmal atrial fibrillation)   . Depression   . Melanoma    Past Surgical History  Procedure Laterality Date  . Tonsillectomy    . Appendectomy    . Cataract extraction      bilateral  . Cesarean section      Current Outpatient Prescriptions  Medication Sig Dispense Refill  . amiodarone (PACERONE) 200 MG tablet Take 0.5 tablets (100 mg total) by mouth daily.  30 tablet  5  . calcium-vitamin D (OSCAL WITH D 500-200) 500-200 MG-UNIT per tablet Take 1 tablet by mouth daily.        . Coenzyme Q10 (CO Q 10) 100 MG CAPS Take 1 capsule by mouth daily.       . Multiple Vitamins-Minerals (CENTRUM SILVER PO) Take 1 tablet by mouth daily.      . pantoprazole (PROTONIX) 40 MG tablet Take 1 tablet 30-60 min before first meal of the day      . pyridOXINE (VITAMIN B-6) 100 MG tablet Take 100 mg by mouth daily.        . Rivaroxaban (XARELTO) 15 MG TABS tablet Take 1 tablet (15 mg total) by mouth daily.  30 tablet  5   No current facility-administered medications for this visit.    Physical Exam: Filed Vitals:   01/03/13 0945  BP: 136/69  Pulse: 67  Height: 5\' 6"  (1.676 m)  Weight: 130 lb (58.968 kg)    GEN- The patient is well appearing, alert and oriented x 3  today.   Head- normocephalic, atraumatic Eyes-  Sclera clear, conjunctiva pink Ears- hearing intact Oropharynx- clear Lungs- Clear to ausculation bilaterally, normal work of breathing Heart- Regular rate and rhythm, no murmurs, rubs or gallops, PMI not laterally displaced GI- soft, NT, ND, + BS Extremities- no clubbing, cyanosis, or edema  ekg today reveals sinus rhythm 63 bpm, PR 174,  nonspecific ST/T changes  Assessment and Plan:  1. afib Maintaining sinus rhythm Continue anticoagulation Decrease amiodarone to 100mg  daily pfts pending  Return to see Lawson Fiscal in 6 months I will see in a year

## 2013-01-03 NOTE — Patient Instructions (Addendum)
Your physician wants you to follow-up in: 6 months with Sunday Spillers and 12 months with Dr Jacquiline Doe will receive a reminder letter in the mail two months in advance. If you don't receive a letter, please call our office to schedule the follow-up appointment.  Your physician has recommended you make the following change in your medication:  1) Decrease Amiodarone to 100mg  daily  Have your Pulmonary function test done that [facility] ordered

## 2013-01-04 DIAGNOSIS — M25559 Pain in unspecified hip: Secondary | ICD-10-CM | POA: Diagnosis not present

## 2013-01-04 DIAGNOSIS — IMO0002 Reserved for concepts with insufficient information to code with codable children: Secondary | ICD-10-CM | POA: Diagnosis not present

## 2013-01-04 DIAGNOSIS — M5137 Other intervertebral disc degeneration, lumbosacral region: Secondary | ICD-10-CM | POA: Diagnosis not present

## 2013-01-04 DIAGNOSIS — M999 Biomechanical lesion, unspecified: Secondary | ICD-10-CM | POA: Diagnosis not present

## 2013-01-08 DIAGNOSIS — M25559 Pain in unspecified hip: Secondary | ICD-10-CM | POA: Diagnosis not present

## 2013-01-08 DIAGNOSIS — IMO0002 Reserved for concepts with insufficient information to code with codable children: Secondary | ICD-10-CM | POA: Diagnosis not present

## 2013-01-08 DIAGNOSIS — M999 Biomechanical lesion, unspecified: Secondary | ICD-10-CM | POA: Diagnosis not present

## 2013-01-08 DIAGNOSIS — M5137 Other intervertebral disc degeneration, lumbosacral region: Secondary | ICD-10-CM | POA: Diagnosis not present

## 2013-01-11 DIAGNOSIS — M5137 Other intervertebral disc degeneration, lumbosacral region: Secondary | ICD-10-CM | POA: Diagnosis not present

## 2013-01-11 DIAGNOSIS — M999 Biomechanical lesion, unspecified: Secondary | ICD-10-CM | POA: Diagnosis not present

## 2013-01-11 DIAGNOSIS — M25559 Pain in unspecified hip: Secondary | ICD-10-CM | POA: Diagnosis not present

## 2013-01-11 DIAGNOSIS — IMO0002 Reserved for concepts with insufficient information to code with codable children: Secondary | ICD-10-CM | POA: Diagnosis not present

## 2013-01-15 DIAGNOSIS — M5137 Other intervertebral disc degeneration, lumbosacral region: Secondary | ICD-10-CM | POA: Diagnosis not present

## 2013-01-15 DIAGNOSIS — IMO0002 Reserved for concepts with insufficient information to code with codable children: Secondary | ICD-10-CM | POA: Diagnosis not present

## 2013-01-15 DIAGNOSIS — M25559 Pain in unspecified hip: Secondary | ICD-10-CM | POA: Diagnosis not present

## 2013-01-15 DIAGNOSIS — M999 Biomechanical lesion, unspecified: Secondary | ICD-10-CM | POA: Diagnosis not present

## 2013-01-18 DIAGNOSIS — M25559 Pain in unspecified hip: Secondary | ICD-10-CM | POA: Diagnosis not present

## 2013-01-18 DIAGNOSIS — M999 Biomechanical lesion, unspecified: Secondary | ICD-10-CM | POA: Diagnosis not present

## 2013-01-18 DIAGNOSIS — IMO0002 Reserved for concepts with insufficient information to code with codable children: Secondary | ICD-10-CM | POA: Diagnosis not present

## 2013-01-18 DIAGNOSIS — M5137 Other intervertebral disc degeneration, lumbosacral region: Secondary | ICD-10-CM | POA: Diagnosis not present

## 2013-01-19 ENCOUNTER — Ambulatory Visit (INDEPENDENT_AMBULATORY_CARE_PROVIDER_SITE_OTHER): Payer: Medicare Other | Admitting: Internal Medicine

## 2013-01-19 DIAGNOSIS — R0602 Shortness of breath: Secondary | ICD-10-CM | POA: Diagnosis not present

## 2013-01-19 LAB — PULMONARY FUNCTION TEST

## 2013-01-19 NOTE — Progress Notes (Signed)
PFT done today. 

## 2013-01-22 ENCOUNTER — Ambulatory Visit (INDEPENDENT_AMBULATORY_CARE_PROVIDER_SITE_OTHER): Payer: Medicare Other | Admitting: Internal Medicine

## 2013-01-22 ENCOUNTER — Encounter: Payer: Self-pay | Admitting: Internal Medicine

## 2013-01-22 VITALS — BP 100/60 | HR 57 | Temp 97.7°F | Ht 66.0 in | Wt 134.4 lb

## 2013-01-22 DIAGNOSIS — IMO0002 Reserved for concepts with insufficient information to code with codable children: Secondary | ICD-10-CM | POA: Diagnosis not present

## 2013-01-22 DIAGNOSIS — R05 Cough: Secondary | ICD-10-CM | POA: Diagnosis not present

## 2013-01-22 DIAGNOSIS — M5137 Other intervertebral disc degeneration, lumbosacral region: Secondary | ICD-10-CM | POA: Diagnosis not present

## 2013-01-22 DIAGNOSIS — M999 Biomechanical lesion, unspecified: Secondary | ICD-10-CM | POA: Diagnosis not present

## 2013-01-22 DIAGNOSIS — J449 Chronic obstructive pulmonary disease, unspecified: Secondary | ICD-10-CM | POA: Diagnosis not present

## 2013-01-22 DIAGNOSIS — M25559 Pain in unspecified hip: Secondary | ICD-10-CM | POA: Diagnosis not present

## 2013-01-22 NOTE — Assessment & Plan Note (Addendum)
PFTs 01/19/2013  FEV1  1.19 (61%) ratio 69 and no change p B2, DLCO 67 corrects to 103%  Symptoms not typical for copd though could have CB - would like to complete rx for upper airway cough syndrome before considering more aggressive rx given her confusion with instructions to date and the lack of hx to suggest limiting sob or tendency to aecopd

## 2013-01-22 NOTE — Progress Notes (Signed)
Subjective:    Patient ID: Nancy [name], female    DOB: Jun 25, 1925   MRN: [id number]  Brief patient profile:  24 yowf quit smoking age 79 c no resp symptoms at all  referred 07/13/2012 by Dr Timothy Lasso for eval of chronic cough and proved to have GOLD II COPD 01/19/13    History of Present Illness  07/13/2012  1st pulmonary eval/ [facility] cc year round cough present daily x 20 plus years with more sputum production in am x one year > white mucus less than tsp assoc with doe when bending over and picking up things but able work out regularly at the [facility] s difficulty. rec gerd rx/ diet/ pred x 6 days  08/11/2012 f/u ov/Heavenlee Maiorana f/u cough Chief Complaint  Patient presents with  . Follow-up    Cough is the same- no better or worse, no new co's today.   has classic voice fatigue and some watery rhinorrhea. No trouble at all sleeping  rec Continue Pantoprazole (protonix) 40 mg   Take 30-60 min before first meal of the day and Pepcid 20 mg one bedtime until cough is gone, then stop Start chlortrimeton 4 mg every 6 hours as needed for cough GERD diet  10/04/2012 f/u ov/Jerl Munyan stopped gerd rx when rx ran out Chief Complaint  Patient presents with  . Follow-up    Pt c/o increased cough x 4 days- prod with moderate white sputum.   does not remember whether prev rec meds helped or not or even if she took them as instructed, cough is worse p stirs in am, not while supine, min productive and no purulence.    >>pred taper / med calendar   10/26/2012 Follow up and med review Patient returns for a two-week followup and medication review. We reviewed all her medications and organized them into a medication calendar with patient education. It appears the patient is taking her medications correctly.  patient was seen 2 weeks ago and given a prednisone taper. She reports that her cough is some better.  rec Follow med calendar closely and bring to each visit    01/22/2013 f/u ov/Radin Raptis re: cough/ did not bring med calendar  / not following the one I showed / never got H1 Chief Complaint  Patient presents with  . Follow-up    Persistant cough w/ & w/o mucus   Not limited by sob "nothing you've done has helped but it's not that big a deal anyway".      No obvious day to day or daytime variabilty or assoc  cp or chest tightness, subjective wheeze overt sinus or hb symptoms. No unusual exp hx or h/o childhood pna/ asthma or knowledge of premature birth.  Sleeping ok without nocturnal  or early am exacerbation  of respiratory  c/o's or need for noct saba. Also denies any obvious fluctuation of symptoms with weather or environmental changes or other aggravating or alleviating factors except as outlined above   Current Medications, Allergies, Complete Past Medical History, Past Surgical History, Family History, and Social History were reviewed in [employer] record.  ROS  The following are not active complaints unless bolded sore throat, dysphagia, dental problems, itching, sneezing,  nasal congestion or excess/ purulent secretions, ear ache,   fever, chills, sweats, unintended wt loss, pleuritic or exertional cp, hemoptysis,  orthopnea pnd or leg swelling, presyncope, palpitations, heartburn, abdominal pain, anorexia, nausea, vomiting, diarrhea  or change in bowel or urinary habits, change in stools or urine, dysuria,hematuria,  rash, arthralgias, visual complaints, headache, numbness weakness or ataxia or problems with walking or coordination,  change in mood/affect or memory.             Objective:   Physical Exam  amb wf  nad   08/11/2012 138 > 10/04/2012 132  >132 10/26/2012 > 01/22/2013 134   HEENT: nl dentition, turbinates, and orophanx. Nl external ear canals without cough reflex   NECK :  without JVD/Nodes/TM/ nl carotid upstrokes bilaterally   LUNGS: no acc muscle use,  A few insp and exp rhonchi bilaterally    CV:  RRR  no s3 or murmur or increase in P2, no edema   ABD:   soft and nontender with nl excursion in the supine position. No bruits or organomegaly, bowel sounds nl  MS:  warm without deformities, calf tenderness, cyanosis or clubbing          cxr 07/14/12  Chronic changes. No active cardiopulmonary disease.      Assessment & Plan:

## 2013-01-22 NOTE — Assessment & Plan Note (Signed)
Does not buy into the concept of meaningful and accurate med consolidation / verification that meds are correct.  Without this it will be very difficult to help her with her "20 year cough" and therefore all I rec today was to go back and add 1st get H1 as per guidelines.    Each maintenance medication was reviewed in detail including most importantly the difference between maintenance and as needed and under what circumstances the prns are to be used. This was done in the context of a new medication calendar review which provided the patient with a user-friendly unambiguous mechanism for medication administration and reconciliation and provides an action plan for all active problems. It is critical that this be shown to every doctor  for modification during the office visit if necessary so the patient can use it as a working document  Pulmonary f/u can be prn

## 2013-01-22 NOTE — Patient Instructions (Addendum)
Try taking chlortrimeton 4 mg 2 at bedtime to see if the am cough improves  You can also take the chlortrimeton up to 4 mg every 6 hours as needed  Call me back immediately if unable to locate chlortrimeton with your pharmacists help if needed

## 2013-01-23 DIAGNOSIS — M25559 Pain in unspecified hip: Secondary | ICD-10-CM | POA: Diagnosis not present

## 2013-01-23 DIAGNOSIS — IMO0002 Reserved for concepts with insufficient information to code with codable children: Secondary | ICD-10-CM | POA: Diagnosis not present

## 2013-01-23 DIAGNOSIS — M999 Biomechanical lesion, unspecified: Secondary | ICD-10-CM | POA: Diagnosis not present

## 2013-01-23 DIAGNOSIS — M5137 Other intervertebral disc degeneration, lumbosacral region: Secondary | ICD-10-CM | POA: Diagnosis not present

## 2013-01-29 DIAGNOSIS — M999 Biomechanical lesion, unspecified: Secondary | ICD-10-CM | POA: Diagnosis not present

## 2013-01-29 DIAGNOSIS — IMO0002 Reserved for concepts with insufficient information to code with codable children: Secondary | ICD-10-CM | POA: Diagnosis not present

## 2013-01-29 DIAGNOSIS — M5137 Other intervertebral disc degeneration, lumbosacral region: Secondary | ICD-10-CM | POA: Diagnosis not present

## 2013-01-29 DIAGNOSIS — M25559 Pain in unspecified hip: Secondary | ICD-10-CM | POA: Diagnosis not present

## 2013-02-01 DIAGNOSIS — L819 Disorder of pigmentation, unspecified: Secondary | ICD-10-CM | POA: Diagnosis not present

## 2013-02-01 DIAGNOSIS — IMO0002 Reserved for concepts with insufficient information to code with codable children: Secondary | ICD-10-CM | POA: Diagnosis not present

## 2013-02-01 DIAGNOSIS — L821 Other seborrheic keratosis: Secondary | ICD-10-CM | POA: Diagnosis not present

## 2013-02-01 DIAGNOSIS — D485 Neoplasm of uncertain behavior of skin: Secondary | ICD-10-CM | POA: Diagnosis not present

## 2013-02-01 DIAGNOSIS — L570 Actinic keratosis: Secondary | ICD-10-CM | POA: Diagnosis not present

## 2013-02-01 DIAGNOSIS — M25559 Pain in unspecified hip: Secondary | ICD-10-CM | POA: Diagnosis not present

## 2013-02-01 DIAGNOSIS — M999 Biomechanical lesion, unspecified: Secondary | ICD-10-CM | POA: Diagnosis not present

## 2013-02-01 DIAGNOSIS — M5137 Other intervertebral disc degeneration, lumbosacral region: Secondary | ICD-10-CM | POA: Diagnosis not present

## 2013-02-05 DIAGNOSIS — M5137 Other intervertebral disc degeneration, lumbosacral region: Secondary | ICD-10-CM | POA: Diagnosis not present

## 2013-02-05 DIAGNOSIS — M25559 Pain in unspecified hip: Secondary | ICD-10-CM | POA: Diagnosis not present

## 2013-02-05 DIAGNOSIS — M999 Biomechanical lesion, unspecified: Secondary | ICD-10-CM | POA: Diagnosis not present

## 2013-02-05 DIAGNOSIS — IMO0002 Reserved for concepts with insufficient information to code with codable children: Secondary | ICD-10-CM | POA: Diagnosis not present

## 2013-02-06 ENCOUNTER — Encounter: Payer: Self-pay | Admitting: Nurse Practitioner

## 2013-02-19 ENCOUNTER — Other Ambulatory Visit: Payer: Self-pay

## 2013-02-19 DIAGNOSIS — Z1231 Encounter for screening mammogram for malignant neoplasm of breast: Secondary | ICD-10-CM

## 2013-02-20 ENCOUNTER — Other Ambulatory Visit: Payer: Self-pay | Admitting: Family Medicine

## 2013-02-20 DIAGNOSIS — M549 Dorsalgia, unspecified: Secondary | ICD-10-CM | POA: Diagnosis not present

## 2013-02-20 DIAGNOSIS — M542 Cervicalgia: Secondary | ICD-10-CM | POA: Diagnosis not present

## 2013-02-20 DIAGNOSIS — M810 Age-related osteoporosis without current pathological fracture: Secondary | ICD-10-CM | POA: Diagnosis not present

## 2013-02-20 DIAGNOSIS — IMO0002 Reserved for concepts with insufficient information to code with codable children: Secondary | ICD-10-CM | POA: Diagnosis not present

## 2013-02-20 DIAGNOSIS — I4891 Unspecified atrial fibrillation: Secondary | ICD-10-CM | POA: Diagnosis not present

## 2013-02-22 ENCOUNTER — Ambulatory Visit
Admission: RE | Admit: 2013-02-22 | Discharge: 2013-02-22 | Disposition: A | Discharge status: Home / Self Care | Payer: Medicare Other | Source: Ambulatory Visit | Attending: Family Medicine | Admitting: Family Medicine

## 2013-02-22 DIAGNOSIS — M47817 Spondylosis without myelopathy or radiculopathy, lumbosacral region: Secondary | ICD-10-CM | POA: Diagnosis not present

## 2013-02-22 DIAGNOSIS — M549 Dorsalgia, unspecified: Secondary | ICD-10-CM

## 2013-03-23 ENCOUNTER — Ambulatory Visit
Admission: RE | Admit: 2013-03-23 | Discharge: 2013-03-23 | Disposition: A | Discharge status: Home / Self Care | Payer: Medicare Other | Source: Ambulatory Visit

## 2013-03-23 DIAGNOSIS — Z1231 Encounter for screening mammogram for malignant neoplasm of breast: Secondary | ICD-10-CM | POA: Diagnosis not present

## 2013-03-27 ENCOUNTER — Other Ambulatory Visit: Payer: Self-pay | Admitting: Internal Medicine

## 2013-04-04 DIAGNOSIS — M47817 Spondylosis without myelopathy or radiculopathy, lumbosacral region: Secondary | ICD-10-CM | POA: Diagnosis not present

## 2013-04-04 DIAGNOSIS — M545 Low back pain: Secondary | ICD-10-CM | POA: Diagnosis not present

## 2013-04-04 DIAGNOSIS — M5137 Other intervertebral disc degeneration, lumbosacral region: Secondary | ICD-10-CM | POA: Diagnosis not present

## 2013-04-10 DIAGNOSIS — R059 Cough, unspecified: Secondary | ICD-10-CM | POA: Diagnosis not present

## 2013-04-10 DIAGNOSIS — R05 Cough: Secondary | ICD-10-CM | POA: Diagnosis not present

## 2013-04-10 DIAGNOSIS — I4891 Unspecified atrial fibrillation: Secondary | ICD-10-CM | POA: Diagnosis not present

## 2013-04-10 DIAGNOSIS — IMO0002 Reserved for concepts with insufficient information to code with codable children: Secondary | ICD-10-CM | POA: Diagnosis not present

## 2013-04-12 DIAGNOSIS — M545 Low back pain, unspecified: Secondary | ICD-10-CM | POA: Diagnosis not present

## 2013-04-12 DIAGNOSIS — M47817 Spondylosis without myelopathy or radiculopathy, lumbosacral region: Secondary | ICD-10-CM | POA: Diagnosis not present

## 2013-04-12 DIAGNOSIS — IMO0002 Reserved for concepts with insufficient information to code with codable children: Secondary | ICD-10-CM | POA: Diagnosis not present

## 2013-04-17 ENCOUNTER — Ambulatory Visit (INDEPENDENT_AMBULATORY_CARE_PROVIDER_SITE_OTHER): Payer: Medicare Other | Admitting: Cardiology

## 2013-04-17 ENCOUNTER — Encounter: Payer: Self-pay | Admitting: Cardiology

## 2013-04-17 VITALS — BP 122/70 | HR 76 | Ht 66.0 in | Wt 129.0 lb

## 2013-04-17 DIAGNOSIS — I4891 Unspecified atrial fibrillation: Secondary | ICD-10-CM

## 2013-04-17 NOTE — Patient Instructions (Signed)
Your physician recommends that she will speak with Dr. Rayann Heman pertaning your healthcare and we will give you a call for your follow up appointment  Your physician recommends that you continue on your current medications as directed. Please refer to the Current Medication list given to you today.

## 2013-04-18 NOTE — Progress Notes (Signed)
Patient ID: GENNI CARLOCK MRN: [id number], DOB/AGE: [birth year]   Date of Visit: 04/17/2013  Primary Physician: Creola Corn, MD Primary Cardiologist: Hillis Range, MD Reason for Visit: Atrial fibrillation  History of Present Illness  Nancy [name] is a 80 y.o. female with PAF who presents today for electrophysiology followup. She has been maintained on amiodarone and Xarelto. At her last visit with Dr. Johney Frame in October 2014 she was in SR. Amiodarone was decreased to 100 mg once daily.   She presented to her PCP's office over one week ago with cold / URI symptoms. She was also experiencing dizziness. At that time an ECG was done which revealed she was back in AFib. She was instructed to increase amiodarone back to 200 mg daily and then scheduled today's visit.  Today she reports she is doing well and has no complaints. She feels much better and states her dizziness has resolved. She is now back to her regular exercise regimen and completed the workout this AM without difficulty or limitation. She denies chest pain or shortness of breath. She reports fairly frequent "irregular" palpitations and states she thought she was in AFib all the time. She denies dizziness, near syncope or syncope. She denies LE swelling, orthopnea, PND or recent weight gain. She reports missing doses of Xarelto intermittently, approximately one dose weekly.   Past Medical History Past Medical History  Diagnosis Date  . Migraine   . GERD (gastroesophageal reflux disease)   . Dyslipidemia   . History of scarlet fever   . PAF (paroxysmal atrial fibrillation)   . Depression   . Melanoma     Past Surgical History Past Surgical History  Procedure Laterality Date  . Tonsillectomy    . Appendectomy    . Cataract extraction      bilateral  . Cesarean section      Allergies/Intolerances No Known Allergies  Current Home Medications Current Outpatient Prescriptions  Medication Sig Dispense Refill  . vitamin B-12  (CYANOCOBALAMIN) 100 MCG tablet Take 100 mcg by mouth daily.      Marland Kitchen amiodarone (PACERONE) 200 MG tablet Take 0.5 tablets (100 mg total) by mouth daily.  30 tablet  5  . amiodarone (PACERONE) 200 MG tablet Take 0.5 tablets (100 mg total) by mouth daily.  15 tablet  3  . calcium-vitamin D (OSCAL WITH D 500-200) 500-200 MG-UNIT per tablet Take 1 tablet by mouth daily.        . Coenzyme Q10 (CO Q 10) 100 MG CAPS Take 1 capsule by mouth daily.       . Multiple Vitamins-Minerals (CENTRUM SILVER PO) Take 1 tablet by mouth daily.      . pantoprazole (PROTONIX) 40 MG tablet Take 1 tablet 30-60 min before first meal of the day      . pyridOXINE (VITAMIN B-6) 100 MG tablet Take 100 mg by mouth daily.       . Rivaroxaban (XARELTO) 15 MG TABS tablet Take 1 tablet (15 mg total) by mouth daily.  30 tablet  5   No current facility-administered medications for this visit.    Social History History   Social History  . Marital Status: Married    Spouse Name: N/A    Number of Children: N/A  . Years of Education: N/A   Occupational History  . Not on file.   Social History Main Topics  . Smoking status: Former Smoker -- 6 years    Types: Cigarettes    Quit date: 09/09/1967  .  Smokeless tobacco: Not on file     Comment: pt reports she smoked socially  . Alcohol Use: 0.6 oz/week    1 Glasses of wine per week  . Drug Use: No  . Sexual Activity: Not on file   Other Topics Concern  . Not on file   Social History Narrative  . No narrative on file     Review of Systems General: No chills, fever, night sweats or weight changes Cardiovascular: No chest pain, dyspnea on exertion, edema, orthopnea, palpitations, paroxysmal nocturnal dyspnea Dermatological: No rash, lesions or masses Respiratory: No cough, dyspnea Urologic: No hematuria, dysuria Abdominal: No nausea, vomiting, diarrhea, bright red blood per rectum, melena, or hematemesis Neurologic: No visual changes, weakness, changes in mental  status All other systems reviewed and are otherwise negative except as noted above.  Physical Exam Vitals: Blood pressure 122/70, pulse 76, height 5\' 6"  (1.676 m), weight 129 lb (58.514 kg).  General: Well developed, well appearing 80 y.o. female in no acute distress. HEENT: Normocephalic, atraumatic. EOMs intact. Sclera nonicteric. Oropharynx clear.  Neck: Supple. No JVD. Lungs: Respirations regular and unlabored, CTA bilaterally. No wheezes, rales or rhonchi. Heart: Irregular. S1, S2 present. No murmurs, rub, S3 or S4. Abdomen: Soft, non-distended.  Extremities: No clubbing, cyanosis or edema. PT/Radials 2+ and equal bilaterally. Psych: Normal affect. Neuro: Alert and oriented X 3. Moves all extremities spontaneously.   Diagnostics 12-lead ECG today - atrial fibrillation at 83 bpm; no ST-T wave abnormalities; QRS 88, QT/QTc 396/465  Assessment and Plan 1. Atrial fibrillation - rate controlled and asymptomatic currently - most likely triggered by recent URI although Nancy [name] reports fairly regular palpitations since her last office visit (3 months ago) so may be in AFib more often than documented - for now continue amiodarone 200 mg once daily as previously directed by Dr. Johney Frame; not a candidate for cardioversion due to noncompliance with / missed doses of Xarelto  - she was instructed to keep her scheduled follow-up with Dr. Johney Frame in 3 weeks to discuss AFib management - if persistent but rate controlled and asymptomatic could consider rate control approach if amiodarone deemed ineffective for maintaining SR - continue Xarelto for stroke prevention; she was counseled regarding the importance of medication compliance  Signed, Nancy Mullane, PA-C 04/18/2013, 11:01 PM

## 2013-04-22 DIAGNOSIS — Z23 Encounter for immunization: Secondary | ICD-10-CM | POA: Diagnosis not present

## 2013-05-02 ENCOUNTER — Other Ambulatory Visit: Payer: Self-pay

## 2013-05-02 MED ORDER — AMIODARONE HCL 200 MG PO TABS: 200.0000 mg | ORAL_TABLET | Freq: Every day | ORAL | Status: DC

## 2013-05-09 DIAGNOSIS — M47817 Spondylosis without myelopathy or radiculopathy, lumbosacral region: Secondary | ICD-10-CM | POA: Diagnosis not present

## 2013-05-09 DIAGNOSIS — M545 Low back pain, unspecified: Secondary | ICD-10-CM | POA: Diagnosis not present

## 2013-05-09 DIAGNOSIS — IMO0001 Reserved for inherently not codable concepts without codable children: Secondary | ICD-10-CM | POA: Diagnosis not present

## 2013-05-15 DIAGNOSIS — M545 Low back pain, unspecified: Secondary | ICD-10-CM | POA: Diagnosis not present

## 2013-05-16 DIAGNOSIS — H5210 Myopia, unspecified eye: Secondary | ICD-10-CM | POA: Diagnosis not present

## 2013-05-16 DIAGNOSIS — H5200 Hypermetropia, unspecified eye: Secondary | ICD-10-CM | POA: Diagnosis not present

## 2013-05-16 DIAGNOSIS — Z961 Presence of intraocular lens: Secondary | ICD-10-CM | POA: Diagnosis not present

## 2013-05-16 DIAGNOSIS — H18009 Unspecified corneal deposit, unspecified eye: Secondary | ICD-10-CM | POA: Diagnosis not present

## 2013-05-17 ENCOUNTER — Ambulatory Visit (INDEPENDENT_AMBULATORY_CARE_PROVIDER_SITE_OTHER): Payer: Medicare Other | Admitting: Internal Medicine

## 2013-05-17 ENCOUNTER — Encounter: Payer: Self-pay | Admitting: Internal Medicine

## 2013-05-17 VITALS — BP 142/78 | HR 67 | Ht 66.0 in | Wt 131.8 lb

## 2013-05-17 DIAGNOSIS — I4891 Unspecified atrial fibrillation: Secondary | ICD-10-CM | POA: Diagnosis not present

## 2013-05-17 LAB — HEPATIC FUNCTION PANEL
ALT: 19 U/L (ref 0–35)
AST: 31 U/L (ref 0–37)
Albumin: 3.5 g/dL (ref 3.5–5.2)
Alkaline Phosphatase: 64 U/L (ref 39–117)
BILIRUBIN DIRECT: 0 mg/dL (ref 0.0–0.3)
BILIRUBIN TOTAL: 0.5 mg/dL (ref 0.3–1.2)
Total Protein: 7.1 g/dL (ref 6.0–8.3)

## 2013-05-17 LAB — BASIC METABOLIC PANEL
BUN: 19 mg/dL (ref 6–23)
CALCIUM: 9 mg/dL (ref 8.4–10.5)
CHLORIDE: 101 meq/L (ref 96–112)
CO2: 27 mEq/L (ref 19–32)
CREATININE: 1.4 mg/dL — AB (ref 0.4–1.2)
GFR: 37.32 mL/min — AB (ref >60.00)
Glucose, Bld: 86 mg/dL (ref 70–99)
Potassium: 4.2 mEq/L (ref 3.5–5.1)
Sodium: 138 mEq/L (ref 135–145)

## 2013-05-17 LAB — CBC WITH DIFFERENTIAL/PLATELET
Basophils Absolute: 0 10*3/uL (ref 0.0–0.1)
Basophils Relative: 0.5 % (ref 0.0–3.0)
EOS PCT: 2.4 % (ref 0.0–5.0)
Eosinophils Absolute: 0.2 10*3/uL (ref 0.0–0.7)
HCT: 38.3 % (ref 36.0–46.0)
Hemoglobin: 12.6 g/dL (ref 12.0–15.0)
LYMPHS PCT: 15 % (ref 12.0–46.0)
Lymphs Abs: 1.3 10*3/uL (ref 0.7–4.0)
MCHC: 33 g/dL (ref 30.0–36.0)
MCV: 93.8 fl (ref 78.0–100.0)
Monocytes Absolute: 0.7 10*3/uL (ref 0.1–1.0)
Monocytes Relative: 8.1 % (ref 3.0–12.0)
NEUTROS PCT: 74 % (ref 43.0–77.0)
Neutro Abs: 6.6 10*3/uL (ref 1.4–7.7)
PLATELETS: 349 10*3/uL (ref 150.0–400.0)
RBC: 4.09 Mil/uL (ref 3.87–5.11)
RDW: 14.1 % (ref 11.5–14.6)
WBC: 8.9 10*3/uL (ref 4.5–10.5)

## 2013-05-17 LAB — TSH: TSH: 0.56 u[IU]/mL (ref 0.35–5.50)

## 2013-05-17 NOTE — Patient Instructions (Signed)
Your physician wants you to follow-up in: 6 months with Truitt Merle, NP and 12 months with Dr Vallery Ridge will receive a reminder letter in the mail two months in advance. If you don't receive a letter, please call our office to schedule the follow-up appointment.   Your physician recommends that you return for lab work today

## 2013-05-17 NOTE — Progress Notes (Signed)
  PCP: [name], MD Primary Cardiologist:  Previously Dr Haroldine Laws  Nancy [name] is a 80 y.o. female who presents today for routine electrophysiology followup.  Since last being seen in our clinic, the patient reports doing very well.  She was in afib last visit, but has returned to sinus.  She seems mostly unaware of her afib.  Today, she denies symptoms of palpitations, chest pain, lower extremity edema, presyncope, or syncope.    The patient is otherwise without complaint today.   Past Medical History  Diagnosis Date  . Migraine   . GERD (gastroesophageal reflux disease)   . Dyslipidemia   . History of scarlet fever   . PAF (paroxysmal atrial fibrillation)   . Depression   . Melanoma    Past Surgical History  Procedure Laterality Date  . Tonsillectomy    . Appendectomy    . Cataract extraction      bilateral  . Cesarean section      Current Outpatient Prescriptions  Medication Sig Dispense Refill  . amiodarone (PACERONE) 200 MG tablet Take 1 tablet (200 mg total) by mouth daily.  30 tablet  5  . B Complex Vitamins (B COMPLEX 100 PO) Take 1 tablet by mouth daily.      . calcium-vitamin D (OSCAL WITH D 500-200) 500-200 MG-UNIT per tablet Take 1 tablet by mouth daily.        . Coenzyme Q10 (CO Q 10) 100 MG CAPS Take 1 capsule by mouth daily.       . Multiple Vitamins-Minerals (CENTRUM SILVER PO) Take 1 tablet by mouth daily.      . Rivaroxaban (XARELTO) 15 MG TABS tablet Take 1 tablet (15 mg total) by mouth daily.  30 tablet  5  . vitamin B-12 (CYANOCOBALAMIN) 100 MCG tablet Take 100 mcg by mouth daily.       No current facility-administered medications for this visit.    Physical Exam: Filed Vitals:   05/17/13 1440  BP: 142/78  Pulse: 67  Height: 5\' 6"  (1.676 m)  Weight: 131 lb 12.8 oz (59.784 kg)    GEN- The patient is well appearing, alert and oriented x 3 today.   Head- normocephalic, atraumatic Eyes-  Sclera clear, conjunctiva pink Ears- hearing  intact Oropharynx- clear Lungs- Clear to ausculation bilaterally, normal work of breathing Heart- Regular rate and rhythm, no murmurs, rubs or gallops, PMI not laterally displaced GI- soft, NT, ND, + BS Extremities- no clubbing, cyanosis, or edema  ekg today reveals sinus rhythm 67 bpm, septal infarct  Assessment and Plan:  1. afib Maintaining sinus rhythm Continue anticoagulation chads2vasc score is at least 3 ycontinue amoidarone Check LFTs and TFTs today Check bmet and cbc on xarelto  Return to see Cecille Rubin in 6 months I will see in a year

## 2013-05-18 DIAGNOSIS — M545 Low back pain, unspecified: Secondary | ICD-10-CM | POA: Diagnosis not present

## 2013-05-23 DIAGNOSIS — M545 Low back pain, unspecified: Secondary | ICD-10-CM | POA: Diagnosis not present

## 2013-05-25 DIAGNOSIS — M545 Low back pain, unspecified: Secondary | ICD-10-CM | POA: Diagnosis not present

## 2013-06-04 DIAGNOSIS — M545 Low back pain, unspecified: Secondary | ICD-10-CM | POA: Diagnosis not present

## 2013-06-06 ENCOUNTER — Other Ambulatory Visit: Payer: Self-pay | Admitting: Internal Medicine

## 2013-06-06 DIAGNOSIS — M5137 Other intervertebral disc degeneration, lumbosacral region: Secondary | ICD-10-CM | POA: Diagnosis not present

## 2013-06-06 DIAGNOSIS — IMO0002 Reserved for concepts with insufficient information to code with codable children: Secondary | ICD-10-CM | POA: Diagnosis not present

## 2013-06-06 DIAGNOSIS — M545 Low back pain, unspecified: Secondary | ICD-10-CM | POA: Diagnosis not present

## 2013-06-13 ENCOUNTER — Telehealth: Payer: Self-pay | Admitting: Internal Medicine

## 2013-06-13 NOTE — Telephone Encounter (Signed)
Called [employer] regarding price of xarelto, they state they do not see a claim coming through their system from any pharmacy, they ran a claim check for xarelto for this patient, a 30 day supply is $20.00 a 90 day supply is $40.00. I have called and left patient a VM with this information. She needs to call her pharmacy and or insurer for further problems.

## 2013-06-13 NOTE — Telephone Encounter (Signed)
Went to pick up her Xarelto and it was over $200 and the most she has ever paid was $45.  I let her know it probably has to do with needing a PA.  She was shocked at the price.   I have called her pharmacy and they think she may have a deductible and that this has nothing to do with PA.   [phone]

## 2013-06-13 NOTE — Telephone Encounter (Signed)
New problem   Pt need to speak to nursing concerning her Xarelto she has some questions. Please call pt

## 2013-06-14 ENCOUNTER — Telehealth: Payer: Self-pay | Admitting: *Deleted

## 2013-06-14 NOTE — Telephone Encounter (Signed)
Patient came to the office for xarelto samples as she was completely out. Samples provided. She will follow up with Lovett Sox on Monday about what they had discussed.

## 2013-06-21 ENCOUNTER — Telehealth: Payer: Self-pay | Admitting: *Deleted

## 2013-06-21 NOTE — Telephone Encounter (Signed)
Patient went to pick up xarelto and cost was $321.22, I called her pharmacy and confirmed this, I called [employer] and they state patient has a one time deductible of $291.22, I have informed patient of this.

## 2013-06-28 DIAGNOSIS — E785 Hyperlipidemia, unspecified: Secondary | ICD-10-CM | POA: Diagnosis not present

## 2013-06-28 DIAGNOSIS — E559 Vitamin D deficiency, unspecified: Secondary | ICD-10-CM | POA: Diagnosis not present

## 2013-07-05 DIAGNOSIS — R059 Cough, unspecified: Secondary | ICD-10-CM | POA: Diagnosis not present

## 2013-07-05 DIAGNOSIS — G252 Other specified forms of tremor: Secondary | ICD-10-CM | POA: Diagnosis not present

## 2013-07-05 DIAGNOSIS — Z0000 Encounter for general adult medical examination without abnormal findings: Secondary | ICD-10-CM | POA: Diagnosis not present

## 2013-07-05 DIAGNOSIS — M25559 Pain in unspecified hip: Secondary | ICD-10-CM | POA: Diagnosis not present

## 2013-07-05 DIAGNOSIS — R05 Cough: Secondary | ICD-10-CM | POA: Diagnosis not present

## 2013-07-05 DIAGNOSIS — I2789 Other specified pulmonary heart diseases: Secondary | ICD-10-CM | POA: Diagnosis not present

## 2013-07-05 DIAGNOSIS — M549 Dorsalgia, unspecified: Secondary | ICD-10-CM | POA: Diagnosis not present

## 2013-07-05 DIAGNOSIS — M542 Cervicalgia: Secondary | ICD-10-CM | POA: Diagnosis not present

## 2013-07-05 DIAGNOSIS — I951 Orthostatic hypotension: Secondary | ICD-10-CM | POA: Diagnosis not present

## 2013-07-05 DIAGNOSIS — R82998 Other abnormal findings in urine: Secondary | ICD-10-CM | POA: Diagnosis not present

## 2013-07-05 DIAGNOSIS — G250 Essential tremor: Secondary | ICD-10-CM | POA: Diagnosis not present

## 2013-07-13 DIAGNOSIS — Z1212 Encounter for screening for malignant neoplasm of rectum: Secondary | ICD-10-CM | POA: Diagnosis not present

## 2013-09-15 DIAGNOSIS — L02419 Cutaneous abscess of limb, unspecified: Secondary | ICD-10-CM | POA: Diagnosis not present

## 2013-10-02 DIAGNOSIS — G479 Sleep disorder, unspecified: Secondary | ICD-10-CM | POA: Diagnosis not present

## 2013-10-02 DIAGNOSIS — I2789 Other specified pulmonary heart diseases: Secondary | ICD-10-CM | POA: Diagnosis not present

## 2013-10-02 DIAGNOSIS — I4891 Unspecified atrial fibrillation: Secondary | ICD-10-CM | POA: Diagnosis not present

## 2013-10-02 DIAGNOSIS — R404 Transient alteration of awareness: Secondary | ICD-10-CM | POA: Diagnosis not present

## 2013-10-02 DIAGNOSIS — IMO0002 Reserved for concepts with insufficient information to code with codable children: Secondary | ICD-10-CM | POA: Diagnosis not present

## 2013-12-24 ENCOUNTER — Other Ambulatory Visit: Payer: Self-pay | Admitting: Cardiology

## 2014-01-20 ENCOUNTER — Other Ambulatory Visit: Payer: Self-pay | Admitting: Internal Medicine

## 2014-01-24 DIAGNOSIS — Z23 Encounter for immunization: Secondary | ICD-10-CM | POA: Diagnosis not present

## 2014-02-07 DIAGNOSIS — D485 Neoplasm of uncertain behavior of skin: Secondary | ICD-10-CM | POA: Diagnosis not present

## 2014-02-07 DIAGNOSIS — L570 Actinic keratosis: Secondary | ICD-10-CM | POA: Diagnosis not present

## 2014-02-07 DIAGNOSIS — L814 Other melanin hyperpigmentation: Secondary | ICD-10-CM | POA: Diagnosis not present

## 2014-02-07 DIAGNOSIS — L821 Other seborrheic keratosis: Secondary | ICD-10-CM | POA: Diagnosis not present

## 2014-02-07 DIAGNOSIS — D0372 Melanoma in situ of left lower limb, including hip: Secondary | ICD-10-CM | POA: Diagnosis not present

## 2014-02-07 DIAGNOSIS — Z85820 Personal history of malignant melanoma of skin: Secondary | ICD-10-CM | POA: Diagnosis not present

## 2014-02-11 ENCOUNTER — Emergency Department (HOSPITAL_COMMUNITY)
Admission: EM | Admit: 2014-02-11 | Discharge: 2014-02-11 | Disposition: A | Discharge status: Home / Self Care | Payer: Medicare Other | Attending: Emergency Medicine | Admitting: Emergency Medicine

## 2014-02-11 ENCOUNTER — Encounter (HOSPITAL_COMMUNITY): Payer: Self-pay

## 2014-02-11 ENCOUNTER — Telehealth: Payer: Self-pay | Admitting: Internal Medicine

## 2014-02-11 ENCOUNTER — Emergency Department (HOSPITAL_COMMUNITY): Payer: Medicare Other

## 2014-02-11 DIAGNOSIS — Z79899 Other long term (current) drug therapy: Secondary | ICD-10-CM | POA: Insufficient documentation

## 2014-02-11 DIAGNOSIS — Z85820 Personal history of malignant melanoma of skin: Secondary | ICD-10-CM | POA: Insufficient documentation

## 2014-02-11 DIAGNOSIS — Z8619 Personal history of other infectious and parasitic diseases: Secondary | ICD-10-CM | POA: Diagnosis not present

## 2014-02-11 DIAGNOSIS — Z8659 Personal history of other mental and behavioral disorders: Secondary | ICD-10-CM | POA: Diagnosis not present

## 2014-02-11 DIAGNOSIS — Z87891 Personal history of nicotine dependence: Secondary | ICD-10-CM | POA: Diagnosis not present

## 2014-02-11 DIAGNOSIS — I480 Paroxysmal atrial fibrillation: Secondary | ICD-10-CM | POA: Insufficient documentation

## 2014-02-11 DIAGNOSIS — R0602 Shortness of breath: Secondary | ICD-10-CM

## 2014-02-11 DIAGNOSIS — Z7901 Long term (current) use of anticoagulants: Secondary | ICD-10-CM | POA: Diagnosis not present

## 2014-02-11 DIAGNOSIS — Z8639 Personal history of other endocrine, nutritional and metabolic disease: Secondary | ICD-10-CM | POA: Insufficient documentation

## 2014-02-11 DIAGNOSIS — Z8719 Personal history of other diseases of the digestive system: Secondary | ICD-10-CM | POA: Diagnosis not present

## 2014-02-11 DIAGNOSIS — I340 Nonrheumatic mitral (valve) insufficiency: Secondary | ICD-10-CM | POA: Insufficient documentation

## 2014-02-11 LAB — CBC
HCT: 43.7 % (ref 36.0–46.0)
HEMOGLOBIN: 14.4 g/dL (ref 12.0–15.0)
MCH: 31.1 pg (ref 26.0–34.0)
MCHC: 33 g/dL (ref 30.0–36.0)
MCV: 94.4 fL (ref 78.0–100.0)
Platelets: 365 10*3/uL (ref 150–400)
RBC: 4.63 MIL/uL (ref 3.87–5.11)
RDW: 13.1 % (ref 11.5–15.5)
WBC: 7.7 10*3/uL (ref 4.0–10.5)

## 2014-02-11 LAB — BASIC METABOLIC PANEL
Anion gap: 14 (ref 5–15)
BUN: 22 mg/dL (ref 6–23)
CHLORIDE: 99 meq/L (ref 96–112)
CO2: 26 mEq/L (ref 19–32)
Calcium: 9.8 mg/dL (ref 8.4–10.5)
Creatinine, Ser: 1.38 mg/dL — ABNORMAL HIGH (ref 0.50–1.10)
GFR calc Af Amer: 39 mL/min — ABNORMAL LOW (ref >90)
GFR calc non Af Amer: 34 mL/min — ABNORMAL LOW (ref >90)
Glucose, Bld: 115 mg/dL — ABNORMAL HIGH (ref 70–99)
Potassium: 4.3 mEq/L (ref 3.7–5.3)
Sodium: 139 mEq/L (ref 137–147)

## 2014-02-11 LAB — I-STAT TROPONIN, ED: Troponin i, poc: 0.02 ng/mL (ref 0.00–0.08)

## 2014-02-11 LAB — PRO B NATRIURETIC PEPTIDE: Pro B Natriuretic peptide (BNP): 1097 pg/mL — ABNORMAL HIGH (ref 0–450)

## 2014-02-11 NOTE — ED Provider Notes (Addendum)
CSN: [id number]     Arrival date & time 02/11/14  1108 History   First MD Initiated Contact with Patient 02/11/14 1312     Chief Complaint  Patient presents with  . Shortness of Breath     (Consider location/radiation/quality/duration/timing/severity/associated sxs/prior Treatment) HPI Comments: Pt with hx of PAF on xarelto and amio who over the last 2 days has had fatigue and SOB.  States that even vacuuming causes her to get tired and winded and she has to sit down.  She denies chest tightness, pain or lightheadedness, near syncope or dizziness.  She denies any fever, or wheezing.  No prior lung pathology or diagnosis.  She has felt her pulse yesterday when she was feeling tired and it was irregular.  She had taken all her meds as scheduled and denies any new meds.    Patient is a 80 y.o. female presenting with shortness of breath. The history is provided by the patient.  Shortness of Breath Severity:  Moderate Onset quality:  Sudden Duration:  2 days Timing:  Constant Progression:  Improving Chronicity:  Recurrent Context: activity   Context: not URI and not weather changes   Relieved by:  Rest Worsened by:  Exertion Ineffective treatments:  None tried Associated symptoms: no abdominal pain, no chest pain, no fever, no headaches, no neck pain, no sore throat, no vomiting and no wheezing   Associated symptoms comment:  Chronic productive cough that has not changed Risk factors comment:  Hx of PAF   Past Medical History  Diagnosis Date  . Migraine   . GERD (gastroesophageal reflux disease)   . Dyslipidemia   . History of scarlet fever   . PAF (paroxysmal atrial fibrillation)   . Depression   . Melanoma    Past Surgical History  Procedure Laterality Date  . Tonsillectomy    . Appendectomy    . Cataract extraction      bilateral  . Cesarean section     Family History  Problem Relation Age of Onset  . Colon polyps     History  Substance Use Topics  . Smoking  status: Former Smoker -- 6 years    Types: Cigarettes    Quit date: 09/09/1967  . Smokeless tobacco: Not on file     Comment: pt reports she smoked socially  . Alcohol Use: 0.6 oz/week    1 Glasses of wine per week   OB History    No data available     Review of Systems  Constitutional: Positive for fatigue. Negative for fever.  HENT: Negative for sore throat.   Respiratory: Positive for shortness of breath. Negative for wheezing.   Cardiovascular: Negative for chest pain, palpitations and leg swelling.  Gastrointestinal: Negative for vomiting and abdominal pain.  Genitourinary: Negative for dysuria and flank pain.  Musculoskeletal: Negative for neck pain.  Neurological: Negative for weakness and headaches.  All other systems reviewed and are negative.     Allergies  Review of patient's allergies indicates no known allergies.  Home Medications   Prior to Admission medications   Medication Sig Start Date End Date Taking? Authorizing Provider  amiodarone (PACERONE) 200 MG tablet TAKE 1 TABLET BY MOUTH DAILY 01/21/14  Yes Thompson Grayer, MD  B Complex Vitamins (B COMPLEX 100 PO) Take 1 tablet by mouth daily.   Yes Historical Provider, MD  calcium-vitamin D (OSCAL WITH D 500-200) 500-200 MG-UNIT per tablet Take 1 tablet by mouth daily.     Yes Historical Provider, MD  Coenzyme Q10 (CO Q 10) 100 MG CAPS Take 1 capsule by mouth daily.    Yes Historical Provider, MD  Multiple Vitamins-Minerals (CENTRUM SILVER PO) Take 1 tablet by mouth daily.   Yes Historical Provider, MD  Rivaroxaban (XARELTO) 15 MG TABS tablet Take 15 mg by mouth at bedtime.   Yes Historical Provider, MD  vitamin B-12 (CYANOCOBALAMIN) 100 MCG tablet Take 100 mcg by mouth daily.   Yes Historical Provider, MD  XARELTO 15 MG TABS tablet TAKE 1 TABLET (15 MG TOTAL) BY MOUTH DAILY. Patient not taking: Reported on 02/11/2014    Thompson Grayer, MD   BP 163/54 mmHg  Pulse 64  Temp(Src) 97.6 F (36.4 C) (Oral)  Resp 18   Ht 5\' 6"  (1.676 m)  Wt 133 lb (60.328 kg)  BMI 21.48 kg/m2  SpO2 100% Physical Exam  Constitutional: She is oriented to person, place, and time. She appears well-developed and well-nourished. No distress.  HENT:  Head: Normocephalic and atraumatic.  Mouth/Throat: Oropharynx is clear and moist.  Eyes: Conjunctivae and EOM are normal. Pupils are equal, round, and reactive to light.  Neck: Normal range of motion. Neck supple.  Cardiovascular: Normal rate, regular rhythm and intact distal pulses.   No murmur heard. Pulmonary/Chest: Effort normal and breath sounds normal. No respiratory distress. She has no wheezes. She has no rales. She exhibits no tenderness.  Abdominal: Soft. She exhibits no distension. There is no tenderness. There is no rebound and no guarding.  Musculoskeletal: Normal range of motion. She exhibits no edema or tenderness.  Neurological: She is alert and oriented to person, place, and time.  Skin: Skin is warm and dry. No rash noted. No erythema.  Psychiatric: She has a normal mood and affect. Her behavior is normal.  Nursing note and vitals reviewed.   ED Course  Procedures (including critical care time) Labs Review Labs Reviewed  BASIC METABOLIC PANEL - Abnormal; Notable for the following:    Glucose, Bld 115 (*)    Creatinine, Ser 1.38 (*)    GFR calc non Af Amer 34 (*)    GFR calc Af Amer 39 (*)    All other components within normal limits  PRO B NATRIURETIC PEPTIDE - Abnormal; Notable for the following:    Pro B Natriuretic peptide (BNP) 1097.0 (*)    All other components within normal limits  CBC  I-STAT TROPOININ, ED    Imaging Review Dg Chest 2 View  02/11/2014   CLINICAL DATA:  Shortness of breath for 2 days  EXAM: CHEST  2 VIEW  COMPARISON:  07/13/2012  FINDINGS: The heart size and mediastinal contours are within normal limits. Both lungs are clear. The visualized skeletal structures are unremarkable.  IMPRESSION: No active cardiopulmonary disease.    Electronically Signed   By: Kathreen Devoid   On: 02/11/2014 12:51     EKG Interpretation   Date/Time:  Monday February 11 2014 11:14:41 EST Ventricular Rate:  71 PR Interval:  170 QRS Duration: 90 QT Interval:  396 QTC Calculation: 430 R Axis:   72 Text Interpretation:  Normal sinus rhythm Septal infarct , age  undetermined ST \\T \ T wave abnormality, consider inferolateral ischemia  Abnormal ECG Confirmed by Wilson Singer  MD, STEPHEN ([id number]) on 02/11/2014 11:22:16  AM      MDM   Final diagnoses:  PAF (paroxysmal atrial fibrillation)    Patient with a prior history of paroxysmal atrial fibrillation on amiodarone and xarelto presents today with 2 days of fatigue and  shortness of breath with exertion. She denies chest pain, near syncope, lightheadedness. When she started feeling this way she felt her pulse and it has been irregular. However currently she is symptom-free and she is also in sinus rhythm at this time. Patient denies any infectious symptoms or GI symptoms. She has no prior history of lung pathology. Low suspicion for PE at this time given patient is anticoagulated for her PAF. No suspicion for infection given patient denies any infectious symptoms. Her lungs are clear and she has no overt signs of fluid overload. She denies any new medication.  Pt is well appearing without exam findings.  Feel most likely pt's symptoms are related to PAF which she is in rhythm currently.  CBC, BMP, trop neg.  BNP is elevated to 1000 however is probably related to the PAF.  Pt does not have clinical signs of fluid overload and CXR is clear.  EKG is unchanged from prior except for no a.fib today.  Pt sees Dr. Rayann Heman and spoke with [facility] who will see her tomorrow at 8:30am.  Pt given strict return precautions.  Blanchie Dessert, MD 02/11/14 [place], MD 02/11/14 1544

## 2014-02-11 NOTE — ED Notes (Signed)
MD at bedside. 

## 2014-02-11 NOTE — ED Notes (Signed)
Pt. Reports SOB starting yesterday morning. Does not appear in distress at this time. Denies CP, dizziness, lightheadedness, N/V. Alert and oriented x4.

## 2014-02-11 NOTE — Progress Notes (Signed)
Cardiology Office Note   Date:  02/12/2014   ID:  Nancy [name], DOB 1925/08/28, MRN [id number]  PCP:  [name], MD  Cardiologist:  Dr. Thompson Grayer     History of Present Illness: Nancy [name] is a 80 y.o. female with a hx of PAF controlled on Amiodarone with Xarelto for anticoagulation, MVP with mod MR, HL, Migraines, possible TIA.  She has a hx of pro-arrhythmia (SVT) on Flecainide >> changed to Amiodarone.  Last seen by Dr. Thompson Grayer 05/2013.  Patient was seen in the ED 11/9 for fatigue and dyspnea.  She felt her heart rate was irregular.  BNP was elevated. Cardiac enzymes were normal.  ECG demonstrated NSR. She tells me that she felt out of breath for 2 days. It was mainly in the mornings with getting ready. She continues to exercise 4 days a week. She denies chest discomfort. She denies syncope. She denies orthopnea, PND or edema. In retrospect, she notes her breathing is worsened over the past 6-12 months. She has NYHA 2-2b.  Of note, yesterday prior to going to the emergency room she did note some tingling/numbness in her left hand.   Studies:   - Echo (4/11):  EF 55-60%, mod MR, mild to mod LAE  - Echo (8/14):  EF 55-60%, post MV leaflet prolapse, mod MR, mild LAE, PASP 42 mmHg   - Carotid US (4/13):  0-39% bilateral ICA   Recent Labs/Images:  05/17/2013: ALT 19; TSH 0.56 02/11/2014: BUN 22; Creatinine 1.38*; Hemoglobin 14.4; Potassium 4.3; Pro B Natriuretic peptide (BNP) 1097.0*; Sodium 139   Dg Chest 2 View  02/11/2014    IMPRESSION: No active cardiopulmonary disease.   Electronically Signed   By: Kathreen Devoid   On: 02/11/2014 12:51     Wt Readings from Last 3 Encounters:  02/12/14 136 lb (61.689 kg)  02/11/14 133 lb (60.328 kg)  05/17/13 131 lb 12.8 oz (59.784 kg)     Past Medical History  Diagnosis Date  . Migraine   . GERD (gastroesophageal reflux disease)   . Dyslipidemia   . History of scarlet fever   . PAF (paroxysmal atrial fibrillation)   .  Depression   . Melanoma     Current Outpatient Prescriptions  Medication Sig Dispense Refill  . amiodarone (PACERONE) 200 MG tablet TAKE 1 TABLET BY MOUTH DAILY 30 tablet 3  . B Complex Vitamins (B COMPLEX 100 PO) Take 1 tablet by mouth daily.    . calcium-vitamin D (OSCAL WITH D 500-200) 500-200 MG-UNIT per tablet Take 1 tablet by mouth daily.      . Coenzyme Q10 (CO Q 10) 100 MG CAPS Take 1 capsule by mouth daily.     . Multiple Vitamins-Minerals (CENTRUM SILVER PO) Take 1 tablet by mouth daily.    . Rivaroxaban (XARELTO) 15 MG TABS tablet Take 15 mg by mouth at bedtime.    . vitamin B-12 (CYANOCOBALAMIN) 100 MCG tablet Take 100 mcg by mouth daily.     No current facility-administered medications for this visit.     Allergies:   Review of patient's allergies indicates no known allergies.   Social History:  The patient  reports that she quit smoking about 46 years ago. Her smoking use included Cigarettes. She smoked 0.00 packs per day for 6 years. She does not have any smokeless tobacco history on file. She reports that she drinks about 0.6 oz of alcohol per week. She reports that she does not use illicit  drugs.   Family History:  The patient's family history includes Colon polyps in an other family member.   ROS:  Please see the history of present illness.       All other systems reviewed and negative.    PHYSICAL EXAM: VS:  BP 140/60 mmHg  Pulse 62  Ht 5\' 6"  (1.676 m)  Wt 136 lb (61.689 kg)  BMI 21.96 kg/m2 Well nourished, well developed, in no acute distress HEENT: normal Neck: no JVD Cardiac:  normal S1, S2; RRR; 2/6 holosystolic murmurapex Lungs:  clear to auscultation bilaterally, no wheezing, rhonchi or rales Abd: soft, nontender, no hepatomegaly Ext: no edema Skin: warm and dry Neuro:  CNs 2-12 intact, no focal abnormalities noted  EKG:  NSR, HR 62, normal axis, inferolateral T-wave changes, similar to prior tracings      ASSESSMENT AND PLAN:  1.  Dyspnea:  Etiology not entirely clear. Possibilities include recurrent atrial fibrillation, worsening mitral regurgitation, diastolic CHF, ischemia, deconditioning. BNP in the emergency room initially was quite elevated. Chest x-ray was negative for edema. She does not look particularly volume overloaded today. She certainly does have some risk factors for volume excess.    -  Repeat BNP. Add low-dose diuretic if BNP remains elevated.    -  Arrange follow-up 2-D echocardiogram.    -  Arrange ETT-Myoview. 2.  Paroxysmal atrial fibrillation:  Maintaining NSR. She does note a recent irregular pulse. If symptoms continue, consider event monitor.  Continue amiodarone, Xarelto.  Surveillance Labs:  05/17/2013: ALT 19; AST 31; TSH 0.56.  Repeat LFTs and TSH today. 3.  Mitral regurgitation:  Repeat echo as noted.   Disposition:   FU with me in 3-4 weeks.    Signed, Versie Starks, MHS 02/12/2014 8:54 AM    [facility] [place], [place], [place]  [address] Phone: [phone]; Fax: [phone]

## 2014-02-11 NOTE — ED Notes (Signed)
EKG completed in triage.

## 2014-02-11 NOTE — Telephone Encounter (Signed)
Received direct call transferred from operator. Spoke with pt who reports she is extremely weak and short of breath.  States has episodes of shortness of breath at times but it doesn't usually last this long.   Started yesterday.  Noted elevated irregular heart rate when first short of breath but reports heart rate now normal and regular. She is short of breath at rest. Sounds very short of breath when talking on phone. I have instructed pt she needs to go to [facility] ED.  She wants to drive herself but I have asked her to have someone drive her or call EMS. Pt agreeable with this plan

## 2014-02-11 NOTE — Telephone Encounter (Signed)
New message     Pt is sob

## 2014-02-12 ENCOUNTER — Ambulatory Visit (INDEPENDENT_AMBULATORY_CARE_PROVIDER_SITE_OTHER): Payer: Medicare Other | Admitting: Physician Assistant

## 2014-02-12 ENCOUNTER — Encounter: Payer: Self-pay | Admitting: Physician Assistant

## 2014-02-12 VITALS — BP 140/60 | HR 62 | Ht 66.0 in | Wt 136.0 lb

## 2014-02-12 DIAGNOSIS — I480 Paroxysmal atrial fibrillation: Secondary | ICD-10-CM

## 2014-02-12 DIAGNOSIS — Z79899 Other long term (current) drug therapy: Secondary | ICD-10-CM

## 2014-02-12 DIAGNOSIS — R0600 Dyspnea, unspecified: Secondary | ICD-10-CM | POA: Diagnosis not present

## 2014-02-12 DIAGNOSIS — I340 Nonrheumatic mitral (valve) insufficiency: Secondary | ICD-10-CM | POA: Diagnosis not present

## 2014-02-12 DIAGNOSIS — E785 Hyperlipidemia, unspecified: Secondary | ICD-10-CM | POA: Diagnosis not present

## 2014-02-12 LAB — HEPATIC FUNCTION PANEL
ALBUMIN: 3.1 g/dL — AB (ref 3.5–5.2)
ALT: 19 U/L (ref 0–35)
AST: 28 U/L (ref 0–37)
Alkaline Phosphatase: 66 U/L (ref 39–117)
Bilirubin, Direct: 0 mg/dL (ref 0.0–0.3)
Total Bilirubin: 0.6 mg/dL (ref 0.2–1.2)
Total Protein: 7.1 g/dL (ref 6.0–8.3)

## 2014-02-12 LAB — BRAIN NATRIURETIC PEPTIDE: Pro B Natriuretic peptide (BNP): 237 pg/mL — ABNORMAL HIGH (ref 0.0–100.0)

## 2014-02-12 LAB — TSH: TSH: 1.3 u[IU]/mL (ref 0.35–4.50)

## 2014-02-12 NOTE — Patient Instructions (Signed)
LAB WORK TODAY; BNP, TSH, LFT  Your physician has requested that you have an echocardiogram. Echocardiography is a painless test that uses sound waves to create images of your heart. It provides your doctor with information about the size and shape of your heart and how well your heart's chambers and valves are working. This procedure takes approximately one hour. There are no restrictions for this procedure.  Your physician has requested that you have en exercise stress myoview. For further information please visit [email or web address]. Please follow instruction sheet, as given.  Your physician recommends that you schedule a follow-up appointment in: 3-4 [place], PAC SAME DAY DR. Rayann Heman IS IN THE OFFICE

## 2014-02-13 ENCOUNTER — Other Ambulatory Visit: Payer: Self-pay

## 2014-02-13 DIAGNOSIS — I5030 Unspecified diastolic (congestive) heart failure: Secondary | ICD-10-CM

## 2014-02-13 MED ORDER — FUROSEMIDE 20 MG PO TABS: 20.0000 mg | ORAL_TABLET | ORAL | Status: DC

## 2014-02-14 ENCOUNTER — Encounter: Payer: Self-pay | Admitting: Internal Medicine

## 2014-02-15 ENCOUNTER — Encounter: Payer: Self-pay | Admitting: Cardiovascular Disease

## 2014-02-19 DIAGNOSIS — D0372 Melanoma in situ of left lower limb, including hip: Secondary | ICD-10-CM | POA: Diagnosis not present

## 2014-02-20 ENCOUNTER — Other Ambulatory Visit (HOSPITAL_COMMUNITY): Payer: Medicare Other

## 2014-02-20 ENCOUNTER — Encounter (HOSPITAL_COMMUNITY): Payer: Medicare Other

## 2014-02-25 ENCOUNTER — Other Ambulatory Visit (INDEPENDENT_AMBULATORY_CARE_PROVIDER_SITE_OTHER): Payer: Medicare Other | Admitting: *Deleted

## 2014-02-25 ENCOUNTER — Encounter: Payer: Self-pay | Admitting: Physician Assistant

## 2014-02-25 ENCOUNTER — Other Ambulatory Visit (HOSPITAL_COMMUNITY): Payer: Medicare Other

## 2014-02-25 ENCOUNTER — Ambulatory Visit (HOSPITAL_COMMUNITY): Payer: Medicare Other | Attending: Cardiology

## 2014-02-25 ENCOUNTER — Encounter (HOSPITAL_COMMUNITY): Payer: Medicare Other

## 2014-02-25 ENCOUNTER — Other Ambulatory Visit: Payer: Self-pay

## 2014-02-25 DIAGNOSIS — I480 Paroxysmal atrial fibrillation: Secondary | ICD-10-CM

## 2014-02-25 DIAGNOSIS — I5030 Unspecified diastolic (congestive) heart failure: Secondary | ICD-10-CM

## 2014-02-25 DIAGNOSIS — R0600 Dyspnea, unspecified: Secondary | ICD-10-CM

## 2014-02-25 DIAGNOSIS — Z87891 Personal history of nicotine dependence: Secondary | ICD-10-CM | POA: Insufficient documentation

## 2014-02-25 DIAGNOSIS — I4891 Unspecified atrial fibrillation: Secondary | ICD-10-CM

## 2014-02-25 DIAGNOSIS — E785 Hyperlipidemia, unspecified: Secondary | ICD-10-CM | POA: Diagnosis not present

## 2014-02-25 DIAGNOSIS — I340 Nonrheumatic mitral (valve) insufficiency: Secondary | ICD-10-CM

## 2014-02-25 LAB — BASIC METABOLIC PANEL
BUN: 19 mg/dL (ref 6–23)
CALCIUM: 9.2 mg/dL (ref 8.4–10.5)
CO2: 27 mEq/L (ref 19–32)
CREATININE: 1.5 mg/dL — AB (ref 0.4–1.2)
Chloride: 103 mEq/L (ref 96–112)
GFR: 35.52 mL/min — AB (ref >60.00)
Glucose, Bld: 101 mg/dL — ABNORMAL HIGH (ref 70–99)
POTASSIUM: 4.4 meq/L (ref 3.5–5.1)
Sodium: 139 mEq/L (ref 135–145)

## 2014-02-25 MED ORDER — AMIODARONE HCL 200 MG PO TABS: 200.0000 mg | ORAL_TABLET | Freq: Every day | ORAL | Status: DC

## 2014-02-25 NOTE — Progress Notes (Signed)
2D Echo completed. 02/25/2014

## 2014-02-26 ENCOUNTER — Telehealth: Payer: Self-pay | Admitting: *Deleted

## 2014-02-26 NOTE — Telephone Encounter (Signed)
Follow Up ° ° ° ° ° ° ° °Pt returning phone call °

## 2014-02-26 NOTE — Telephone Encounter (Signed)
pt notified about lab results with verbal understanding  

## 2014-02-26 NOTE — Telephone Encounter (Signed)
lmptcb to go over lab results 

## 2014-02-26 NOTE — Telephone Encounter (Signed)
F/u   Patient returning call to [facility]. [phone]

## 2014-02-26 NOTE — Telephone Encounter (Signed)
No answer

## 2014-02-27 ENCOUNTER — Other Ambulatory Visit: Payer: Self-pay

## 2014-03-01 ENCOUNTER — Telehealth: Payer: Self-pay | Admitting: *Deleted

## 2014-03-01 DIAGNOSIS — Z79899 Other long term (current) drug therapy: Secondary | ICD-10-CM

## 2014-03-01 NOTE — Telephone Encounter (Signed)
pt notified about echo results with verbal understanding. Pt aware needs to be scheduled for PFT's w/DLCO, and sed rate. I will set this up at [facility] so she can have PFT and lab work same day. Pt said ok to lmom w/appts.

## 2014-03-06 ENCOUNTER — Ambulatory Visit (HOSPITAL_COMMUNITY): Payer: Medicare Other | Attending: Cardiology | Admitting: Radiology

## 2014-03-06 ENCOUNTER — Other Ambulatory Visit (HOSPITAL_COMMUNITY): Payer: Medicare Other

## 2014-03-06 DIAGNOSIS — I4891 Unspecified atrial fibrillation: Secondary | ICD-10-CM | POA: Diagnosis not present

## 2014-03-06 DIAGNOSIS — R0602 Shortness of breath: Secondary | ICD-10-CM | POA: Diagnosis not present

## 2014-03-06 DIAGNOSIS — R0600 Dyspnea, unspecified: Secondary | ICD-10-CM

## 2014-03-06 MED ORDER — TECHNETIUM TC 99M SESTAMIBI GENERIC - CARDIOLITE
33.0000 | Freq: Once | INTRAVENOUS | Status: AC | PRN
  Administered 2014-03-06: 33 via INTRAVENOUS

## 2014-03-06 MED ORDER — REGADENOSON 0.4 MG/5ML IV SOLN
0.4000 mg | Freq: Once | INTRAVENOUS | Status: AC
  Administered 2014-03-06: 0.4 mg via INTRAVENOUS

## 2014-03-06 MED ORDER — TECHNETIUM TC 99M SESTAMIBI GENERIC - CARDIOLITE
11.0000 | Freq: Once | INTRAVENOUS | Status: AC | PRN
  Administered 2014-03-06: 11 via INTRAVENOUS

## 2014-03-06 NOTE — Progress Notes (Signed)
[place] [facility] NUCLEAR MED [address] [place], [place] [address] [phone]    Cardiology Nuclear Med Study  Nancy [name] is a 80 y.o. female     MRN : [id number]     DOB: June 16, 1925  Procedure Date: 03/06/2014  Nuclear Med Background Indication for Stress Test:  Evaluation for Ischemia and [facility] 11/15 Dyspnea History:  No known CAD, Afib, Carotid Disease Cardiac Risk Factors: Carotid Disease  Symptoms:  SOB   Nuclear Pre-Procedure Caffeine/Decaff Intake:  None NPO After: 8:30am   Lungs:  clear O2 Sat: 96% on room air. IV 0.9% NS with Angio Cath:  22g  IV Site: R Hand  IV Started by:  Crissie Figures, RN  Chest Size (in):  34 Cup Size: B  Height: 5\' 6"  (1.676 m)  Weight:  136 lb (61.689 kg)  BMI:  Body mass index is 21.96 kg/(m^2). Tech Comments:  N/A    Nuclear Med Study 1 or 2 day study: 1 day  Stress Test Type:  Treadmill/Lexiscan  Reading MD: N/A  Order Authorizing Provider:  Thompson Grayer, MD  Resting Radionuclide: Technetium 83m Sestamibi  Resting Radionuclide Dose: 11.0 mCi   Stress Radionuclide:  Technetium 57m Sestamibi  Stress Radionuclide Dose: 33.0 mCi           Stress Protocol Rest HR: 77 Stress HR: 90  Rest BP: 171/79 Stress BP: 131/55  Exercise Time (min): n/a METS: n/a   Predicted Max HR: 134 bpm % Max HR: 67.16 bpm Rate Pressure Product: 15840   Dose of Adenosine (mg):  n/a Dose of Lexiscan: 0.4 mg  Dose of Atropine (mg): n/a Dose of Dobutamine: n/a mcg/kg/min (at max HR)  Stress Test Technologist: Glade Lloyd, BS-ES  Nuclear Technologist:  Earl Many, CNMT     Rest Procedure:  Myocardial perfusion imaging was performed at rest 45 minutes following the intravenous administration of Technetium 4m Sestamibi. Rest ECG: Normal sinus rhythm. Mild diffuse ST flattening. PACs.  Stress Procedure:  The patient received IV Lexiscan 0.4 mg over 15-seconds with concurrent low level exercise and then Technetium 78m Sestamibi  was injected at 30-seconds while the patient continued walking one more minute.  Quantitative spect images were obtained after a 45-minute delay.  Patient attempted to walk on Bruce Protocol but became very SOB and had to switch to low level Lexiscan.  Her symptoms slowly began to resolve in recovery.  Stress ECG: No significant change from baseline ECG  QPS Raw Data Images:  Normal; no motion artifact; normal heart/lung ratio. Stress Images:  Normal homogeneous uptake in all areas of the myocardium. Rest Images:  Normal homogeneous uptake in all areas of the myocardium. Subtraction (SDS):  No evidence of ischemia. Transient Ischemic Dilatation (Normal <1.22):  0.93 Lung/Heart Ratio (Normal <0.45):  0.29  Quantitative Gated Spect Images QGS EDV:  76 ml QGS ESV:  31 ml  Impression Exercise Capacity:  Lexiscan with low level exercise. BP Response:  Normal blood pressure response. Clinical Symptoms:  Shortness of breath ECG Impression:  No significant ST segment change suggestive of ischemia. Comparison with Prior Nuclear Study: No images to compare  Overall Impression:  Normal stress nuclear study. This is a low risk scan. There is no scar or ischemia.  LV Ejection Fraction: 59%.  LV Wall Motion:  Normal Wall Motion.  [name], MD

## 2014-03-07 ENCOUNTER — Encounter: Payer: Self-pay | Admitting: Physician Assistant

## 2014-03-07 ENCOUNTER — Telehealth: Payer: Self-pay | Admitting: *Deleted

## 2014-03-07 NOTE — Telephone Encounter (Signed)
lmptcb to go over Juliette results

## 2014-03-07 NOTE — Telephone Encounter (Signed)
pt notified about normal myoview results with verbal understanding 

## 2014-03-11 ENCOUNTER — Ambulatory Visit (INDEPENDENT_AMBULATORY_CARE_PROVIDER_SITE_OTHER): Payer: Medicare Other | Admitting: Physician Assistant

## 2014-03-11 ENCOUNTER — Encounter: Payer: Self-pay | Admitting: Physician Assistant

## 2014-03-11 VITALS — BP 148/71 | HR 62 | Ht 66.0 in | Wt 134.0 lb

## 2014-03-11 DIAGNOSIS — I340 Nonrheumatic mitral (valve) insufficiency: Secondary | ICD-10-CM | POA: Diagnosis not present

## 2014-03-11 DIAGNOSIS — R0600 Dyspnea, unspecified: Secondary | ICD-10-CM | POA: Diagnosis not present

## 2014-03-11 DIAGNOSIS — I5032 Chronic diastolic (congestive) heart failure: Secondary | ICD-10-CM | POA: Diagnosis not present

## 2014-03-11 DIAGNOSIS — Z79899 Other long term (current) drug therapy: Secondary | ICD-10-CM

## 2014-03-11 DIAGNOSIS — I480 Paroxysmal atrial fibrillation: Secondary | ICD-10-CM | POA: Diagnosis not present

## 2014-03-11 DIAGNOSIS — I5041 Acute combined systolic (congestive) and diastolic (congestive) heart failure: Secondary | ICD-10-CM | POA: Insufficient documentation

## 2014-03-11 LAB — BASIC METABOLIC PANEL
BUN: 17 mg/dL (ref 6–23)
CALCIUM: 9.6 mg/dL (ref 8.4–10.5)
CO2: 31 mEq/L (ref 19–32)
Chloride: 102 mEq/L (ref 96–112)
Creatinine, Ser: 1.3 mg/dL — ABNORMAL HIGH (ref 0.4–1.2)
GFR: 39.83 mL/min — AB (ref >60.00)
Glucose, Bld: 107 mg/dL — ABNORMAL HIGH (ref 70–99)
Potassium: 4.9 mEq/L (ref 3.5–5.1)
SODIUM: 139 meq/L (ref 135–145)

## 2014-03-11 LAB — SEDIMENTATION RATE: SED RATE: 44 mm/h — AB (ref 0–22)

## 2014-03-11 NOTE — Patient Instructions (Addendum)
   LAB WORK TODAY; BMET, SED RATE.  YOU WILL  NEED TO BE SCHEDULED FOR PFT'S ; ORDER IN COMPUTER  Your physician recommends that you schedule a follow-up appointment in: [place], [facility]   Your physician recommends that you schedule a follow-up appointment in: 3  MONTHS WITH DR. ALLRED

## 2014-03-11 NOTE — Progress Notes (Signed)
Cardiology Office Note   Date:  03/11/2014   ID:  Nancy [name], DOB 12/23/1925, MRN [id number]  PCP:  Nancy Reel, MD  Cardiologist:  Dr. Thompson Grayer     History of Present Illness: Nancy [name] is a 80 y.o. female with a hx of PAF controlled on Amiodarone, Xarelto anticoagulation, MVP with mod MR, HL, Migraines, possible TIA.  She has a hx of pro-arrhythmia (SVT) on Flecainide >> changed to Amiodarone.    I saw her after she was seen in the ED 11/9 for fatigue and dyspnea.  She felt her heart rate was irregular.  BNP was elevated. Cardiac enzymes were normal.  ECG demonstrated NSR. She tells me that she felt out of breath for 2 days. It was mainly in the mornings with getting ready.  She denied chest discomfort with exertion. She denied syncope. She felt her breathing had worsened over the past 6-12 months (NYHA 2-2b).  BNP was slightly elevated. I put her on Lasix twice a week.  I set her up for a stress test which demonstrated no scar or ischemia. Echocardiogram was also obtained and this demonstrated normal LV function with moderate diastolic dysfunction and moderate to severe mitral regurgitation. I reviewed this with Dr. Rayann Heman. We have arranged for PFTs with DLCO and ESR to rule out amiodarone toxicity.    She returns for FU.  She is doing well. She feels like her breathing is better.  She did not note much difference with the Lasix.  She describes NYHA 2 symptoms. She denies chest pain, orthopnea, PND, edema, syncope or near syncope.     Studies:   - Echo (4/11):  EF 55-60%, mod MR, mild to mod LAE  - Echo (8/14):  EF 55-60%, post MV leaflet prolapse, mod MR, mild LAE, PASP 42 mmHg   - Echo (02/25/14):  EF 41-66%, grade 2 diastolic dysfunction, MAC, prolapsing P2 segment of the posterior leaflet with at least moderate and possibly severe MR, mild LAE, normal RV function, PASP 35 mmHg  - Nuclear (03/07/14):  No scar or ischemia, EF 59%, normal study  - Carotid US (4/13):  0-39%  bilateral ICA   Recent Labs/Images:  02/11/2014: Hemoglobin 14.4 02/12/2014: ALT 19; Pro B Natriuretic peptide (BNP) 237.0*; TSH 1.30 02/25/2014: BUN 19; Creatinine 1.5*; Potassium 4.4; Sodium 139     Wt Readings from Last 3 Encounters:  03/06/14 136 lb (61.689 kg)  02/12/14 136 lb (61.689 kg)  02/11/14 133 lb (60.328 kg)     Past Medical History  Diagnosis Date  . Migraine   . GERD (gastroesophageal reflux disease)   . Dyslipidemia   . History of scarlet fever   . PAF (paroxysmal atrial fibrillation)   . Depression   . Melanoma   . Mitral regurgitation     Echo (11/15):  EF 50-55%, Gr 2 DD, MAC, prolapsing P2 segment of post MV leaflet with mod to possibly severe MR, mild LAE, normal RVF, PASP 35 mmHg  . Hx of cardiovascular stress test     Lexiscan Myoview (12/15): No scar, no ischemia, EF 59%; Low Risk    Current Outpatient Prescriptions  Medication Sig Dispense Refill  . amiodarone (PACERONE) 200 MG tablet Take 1 tablet (200 mg total) by mouth daily. 90 tablet 3  . B Complex Vitamins (B COMPLEX 100 PO) Take 1 tablet by mouth daily.    . calcium-vitamin D (OSCAL WITH D 500-200) 500-200 MG-UNIT per tablet Take 1 tablet by mouth daily.      Marland Kitchen  Coenzyme Q10 (CO Q 10) 100 MG CAPS Take 1 capsule by mouth daily.     . furosemide (LASIX) 20 MG tablet Take 1 tablet (20 mg total) by mouth 2 (two) times a week. 30 tablet 3  . Multiple Vitamins-Minerals (CENTRUM SILVER PO) Take 1 tablet by mouth daily.    . Rivaroxaban (XARELTO) 15 MG TABS tablet Take 15 mg by mouth at bedtime.    . vitamin B-12 (CYANOCOBALAMIN) 100 MCG tablet Take 100 mcg by mouth daily.     No current facility-administered medications for this visit.     Allergies:   Review of patient's allergies indicates no known allergies.   Social History:  The patient  reports that she quit smoking about 46 years ago. Her smoking use included Cigarettes. She smoked 0.00 packs per day for 6 years. She does not have any  smokeless tobacco history on file. She reports that she drinks about 0.6 oz of alcohol per week. She reports that she does not use illicit drugs.   Family History:  The patient's family history includes Colon polyps in an other family member.   ROS:  Please see the history of present illness.        All other systems reviewed and negative.    PHYSICAL EXAM: VS:  BP 148/71 mmHg  Pulse 62  Ht _0  (1.676 m)  Wt 134 lb (60.782 kg)  BMI 21.64 kg/m2  SpO2 98% Well nourished, well developed, in no acute distress HEENT: normal Neck: no JVD Cardiac:  normal S1, S2; RRR; 2/6 holosystolic murmurapex Lungs:  clear to auscultation bilaterally, no wheezing, rhonchi or rales Abd: soft, nontender, no hepatomegaly Ext: no edema Skin: warm and dry Neuro:  CNs 2-12 intact, no focal abnormalities noted  EKG:   Atrial Fibrillation, HR 80, inferolateral ST changes similar to prior tracings.     ASSESSMENT AND PLAN:  1.  Dyspnea:  This is likely related to a combination of diastolic CHF and mitral regurgitation. She currently denies significant dyspnea and is NYHA 2. Recent Myoview low risk. She is on amiodarone. As noted, I reviewed her case with Dr. Rayann Heman.    -  PFTs and ESR are pending. 2.  Chronic diastolic CHF (congestive heart failure):  Volume appears stable.  She is NYHA 2.      -  Continue current dose of Lasix.    -  BMET today.  3.  Mitral regurgitation:  Mod to severe on recent echo.  She is not really all that symptomatic.  I have reviewed this with Dr. Thompson Grayer.  We are not certain she is a candidate for surgery given her advanced age.  Will continue to monitor clinically.  4.  Paroxysmal atrial fibrillation:  She is in atrial fibrillation today. Overall, she is not symptomatic. I reviewed her case today with Dr. Rayann Heman. We could consider a rate control strategy only given her lack of symptoms.    -  Continue Amiodarone and Xarelto.      -  Return in one week for follow-up and to  obtain ECG.  If she remains in atrial fibrillation, consider proceeding with DCCV.    -  If there is improved symptoms in NSR, will need to consider continuing a rhythm control strategy.    -  If there are no improved symptoms in NSR, consider rate control strategy only. We could DC amiodarone at that point.  Disposition:   FU with me in 1 week. FU Dr. Jeneen Rinks  Allred 3 mos.    Signed, Versie Starks, MHS 03/11/2014 7:59 AM    [facility] [place], [place], [place]  [address] Phone: [phone]; Fax: [phone]

## 2014-03-13 ENCOUNTER — Telehealth: Payer: Self-pay | Admitting: Physician Assistant

## 2014-03-13 NOTE — Telephone Encounter (Signed)
New message     Talk to the nurse----pt states her left arm went numb (asleep).  After 70min it was normal.  She states she is under the doctor's care and needed to report this.  Please call

## 2014-03-13 NOTE — Telephone Encounter (Signed)
Returned patient's call. Patient states "My left arm was numb from the hand to the elbow, but now it's fine. I do have a little bit of shortness of breath and I feel weak." Patient was doing Tai Chi around 9:30 ( two hours ago) and started having the numbness. It lasted 20 to 30 minutes. Patient started having some SOB and weakness afterwards. She is not having any chest pain at this time. She states "I feel like I am out of rhythm." Asked patient if she has taken her Amiodarone and Xarelto today. Patient had not taken her medications, which she usually takes in the mornings. Talked to Ignacia Bayley NP, per his instructions, patient should take her medications and see if that helps. Ignacia Bayley NP was agreeable to scheduleing the patient for an appointment today.  Patient agreed to taking her medications. Asked patient if she wanted to come in for an appointment. Patient does not feel that she needs to come in right now. Patient encouraged to call back if she does not feel better after taking medications. Patient verbalized understanding and had no questions at this time.  [place]well Poe RN

## 2014-03-13 NOTE — Telephone Encounter (Signed)
See phone note, pt c/o left arm numbness (asleep)

## 2014-03-21 ENCOUNTER — Encounter: Payer: Self-pay | Admitting: Physician Assistant

## 2014-03-21 ENCOUNTER — Ambulatory Visit (INDEPENDENT_AMBULATORY_CARE_PROVIDER_SITE_OTHER): Payer: Medicare Other | Admitting: Physician Assistant

## 2014-03-21 VITALS — BP 130/61 | HR 69 | Ht 66.0 in | Wt 135.0 lb

## 2014-03-21 DIAGNOSIS — R0600 Dyspnea, unspecified: Secondary | ICD-10-CM | POA: Diagnosis not present

## 2014-03-21 DIAGNOSIS — I340 Nonrheumatic mitral (valve) insufficiency: Secondary | ICD-10-CM

## 2014-03-21 DIAGNOSIS — T63441A Toxic effect of venom of bees, accidental (unintentional), initial encounter: Secondary | ICD-10-CM

## 2014-03-21 DIAGNOSIS — R202 Paresthesia of skin: Secondary | ICD-10-CM

## 2014-03-21 DIAGNOSIS — I5032 Chronic diastolic (congestive) heart failure: Secondary | ICD-10-CM | POA: Diagnosis not present

## 2014-03-21 DIAGNOSIS — I480 Paroxysmal atrial fibrillation: Secondary | ICD-10-CM

## 2014-03-21 NOTE — Patient Instructions (Signed)
OK TO TAKE OTC BENADRYL 25 MG AS NEEDED FOR ITCHING  YOU WILL NEED TO BE SCHEDULED FOR PFT'S; ORDER IN COMPUTER  KEEP YOUR APPT WITH DR. ALLRED AS PLANNED

## 2014-03-21 NOTE — Progress Notes (Signed)
Cardiology Office Note   Date:  03/21/2014   ID:  Nancy [name], DOB [birth year], MRN [id number]  PCP:  [name], MD  Cardiologist:  Dr. Thompson Grayer     History of Present Illness: Nancy [name] is a 80 y.o. female with a hx of PAF controlled on Amiodarone, Xarelto anticoagulation, MVP with mod MR, HL, Migraines, possible TIA.  She has a hx of pro-arrhythmia (SVT) on Flecainide >> changed to Amiodarone.    I saw her after she was seen in the ED 11/9 for fatigue and dyspnea.  She felt her heart rate was irregular.  BNP was elevated. Cardiac enzymes were normal.  ECG demonstrated NSR. She told me that she felt out of breath for 2 days. It was mainly in the mornings with getting ready.  She denied chest discomfort with exertion. She denied syncope. She felt her breathing had worsened over the past 6-12 months (NYHA 2-2b).  BNP was slightly elevated. I put her on Lasix twice a week.  I set her up for a stress test which demonstrated no scar or ischemia. Echocardiogram was also obtained and this demonstrated normal LV function with moderate diastolic dysfunction and moderate to severe mitral regurgitation. I reviewed this with Dr. Rayann Heman. We have arranged for PFTs with DLCO and ESR to rule out amiodarone toxicity.    I saw her in FU 12/7.  She was back in AFib.  Heart rate was controlled. I reviewed this with Dr. Rayann Heman. We decided to bring her back today to see if she was still in atrial fibrillation and consider cardioversion.  She is back in NSR today.  She feels no different compared to when she was documented to be in AFib.  She has had no change in her dyspnea.  She is NYHA 2-2b.  She does Tai Chi.  She did have an episode of tingling in her L arm last week.  She denies weakness or facial droop.  She is adherent with her Xarelto.  She denies syncope. No significant edema.     Studies:   - Echo (4/11):  EF 55-60%, mod MR, mild to mod LAE  - Echo (8/14):  EF 55-60%, post MV leaflet  prolapse, mod MR, mild LAE, PASP 42 mmHg   - Echo (02/25/14):  EF 65-46%, grade 2 diastolic dysfunction, MAC, prolapsing P2 segment of the posterior leaflet with at least moderate and possibly severe MR, mild LAE, normal RV function, PASP 35 mmHg  - Nuclear (03/07/14):  No scar or ischemia, EF 59%, normal study  - Carotid US (4/13):  0-39% bilateral ICA   Recent Labs/Images:  02/11/2014: Hemoglobin 14.4 02/12/2014: ALT 19; Pro B Natriuretic peptide (BNP) 237.0*; TSH 1.30 03/11/2014: BUN 17; Creatinine 1.3*; Potassium 4.9; Sodium 139     Wt Readings from Last 3 Encounters:  03/21/14 135 lb (61.236 kg)  03/11/14 134 lb (60.782 kg)  03/06/14 136 lb (61.689 kg)     Past Medical History  Diagnosis Date  . Migraine   . GERD (gastroesophageal reflux disease)   . Dyslipidemia   . History of scarlet fever   . PAF (paroxysmal atrial fibrillation)   . Depression   . Melanoma   . Mitral regurgitation     Echo (11/15):  EF 50-55%, Gr 2 DD, MAC, prolapsing P2 segment of post MV leaflet with mod to possibly severe MR, mild LAE, normal RVF, PASP 35 mmHg  . Hx of cardiovascular stress test     Lexiscan Myoview (  12/15): No scar, no ischemia, EF 59%; Low Risk    Current Outpatient Prescriptions  Medication Sig Dispense Refill  . amiodarone (PACERONE) 200 MG tablet Take 1 tablet (200 mg total) by mouth daily. 90 tablet 3  . B Complex Vitamins (B COMPLEX 100 PO) Take 1 tablet by mouth daily.    . calcium-vitamin D (OSCAL WITH D 500-200) 500-200 MG-UNIT per tablet Take 1 tablet by mouth daily.      . Coenzyme Q10 (CO Q 10) 100 MG CAPS Take 1 capsule by mouth daily.     . furosemide (LASIX) 20 MG tablet Take 1 tablet (20 mg total) by mouth 2 (two) times a week. 30 tablet 3  . Multiple Vitamins-Minerals (CENTRUM SILVER PO) Take 1 tablet by mouth daily.    . Rivaroxaban (XARELTO) 15 MG TABS tablet Take 15 mg by mouth at bedtime.    . vitamin B-12 (CYANOCOBALAMIN) 100 MCG tablet Take 100 mcg by mouth  daily.     No current facility-administered medications for this visit.     Allergies:   Review of patient's allergies indicates no known allergies.   Social History:  The patient  reports that she quit smoking about 46 years ago. Her smoking use included Cigarettes. She smoked 0.00 packs per day for 6 years. She does not have any smokeless tobacco history on file. She reports that she drinks about 0.6 oz of alcohol per week. She reports that she does not use illicit drugs.   Family History:  The patient's family history includes Colon polyps in an other family member. There is no history of Heart attack, Stroke, or Hypertension.   ROS:  Please see the history of present illness.  She was stung by a bee a few days ago.  Her L index finger is pruritic and swollen.  No fever or redness.   All other systems reviewed and negative.    PHYSICAL EXAM: VS:  BP 130/61 mmHg  Pulse 69  Ht '5\' 6"'  (1.676 m)  Wt 135 lb (61.236 kg)  BMI 21.80 kg/m2 Well nourished, well developed, in no acute distress HEENT: normal Neck: no JVD Cardiac:  normal S1, S2; RRR; I cannot appreciate a murmur today Lungs:  clear to auscultation bilaterally, no wheezing, rhonchi or rales Abd: soft, nontender, no hepatomegaly Ext: no edema Skin: warm and dryL index finger edematous; no erythema or calor Neuro:  CNs 2-12 intact, no focal abnormalities noted  EKG:   NSR, HR 69, normal axis, diffuse inferolateral TW changes, no change from prior tracing.   ASSESSMENT AND PLAN:  1.  Dyspnea:  This is likely related to a combination of diastolic CHF and mitral regurgitation. She currently denies significant dyspnea and is NYHA 2. Recent Myoview low risk. She is on amiodarone. As noted, I reviewed her case with Dr. Rayann Heman.  ESR 03/11/14:  44. PFTs are pending.    -  PFTs and ESR are pending. 2.  Chronic diastolic CHF (congestive heart failure):  Volume appears stable.  She is NYHA 2.      -  Continue current dose of Lasix. 3.   Mitral regurgitation:  Mod to severe on recent echo.  She is not really all that symptomatic.  I have reviewed this with Dr. Thompson Grayer.  We are not certain she is a candidate for surgery given her advanced age.  Will continue to monitor clinically.  4.  Paroxysmal atrial fibrillation:  She is back in NSR.  Estimated Creatinine Clearance: 29.1  mL/min (by C-G formula based on Cr of 1.3). Continue current dose of Xarelto.  Continue Amiodarone for now. Surveillance Labs:  02/12/2014: ALT 19; AST 28; TSH 1.30.  If PFTs show changes, we may need to DC this.   5.  Bee Sting:  She can use Benadryl as needed. 6.  L Arm Paresthesia:  Etiology not clear.  She is compliant with anticoagulation.  Symptoms do not sound like a neurologic event.  Monitor for now.    Disposition:   FU with Dr. Thompson Grayer March 2016 as planned.    Signed, Versie Starks, MHS 03/21/2014 12:45 PM    [facility] [place], [place], [place]  [address] Phone: [phone]; Fax: [phone]

## 2014-04-15 ENCOUNTER — Ambulatory Visit (INDEPENDENT_AMBULATORY_CARE_PROVIDER_SITE_OTHER): Payer: Medicare Other | Admitting: Internal Medicine

## 2014-04-15 DIAGNOSIS — Z79899 Other long term (current) drug therapy: Secondary | ICD-10-CM

## 2014-04-15 LAB — PULMONARY FUNCTION TEST
DL/VA % pred: 92 %
DL/VA: 4.62 ml/min/mmHg/L
DLCO unc % pred: 57 %
DLCO unc: 15.4 ml/min/mmHg
FEF 25-75 Post: 0.81 L/sec
FEF 25-75 Pre: 0.64 L/sec
FEF2575-%Change-Post: 26 %
FEF2575-%PRED-PRE: 54 %
FEF2575-%Pred-Post: 68 %
FEV1-%Change-Post: 5 %
FEV1-%PRED-POST: 64 %
FEV1-%PRED-PRE: 60 %
FEV1-Post: 1.21 L
FEV1-Pre: 1.14 L
FEV1FVC-%Change-Post: 4 %
FEV1FVC-%PRED-PRE: 95 %
FEV6-%Change-Post: 1 %
FEV6-%Pred-Post: 70 %
FEV6-%Pred-Pre: 69 %
FEV6-POST: 1.68 L
FEV6-PRE: 1.66 L
FEV6FVC-%Change-Post: 0 %
FEV6FVC-%Pred-Post: 106 %
FEV6FVC-%Pred-Pre: 105 %
FVC-%CHANGE-POST: 1 %
FVC-%PRED-POST: 66 %
FVC-%PRED-PRE: 65 %
FVC-Post: 1.69 L
FVC-Pre: 1.66 L
POST FEV1/FVC RATIO: 71 %
Post FEV6/FVC ratio: 100 %
Pre FEV1/FVC ratio: 69 %
Pre FEV6/FVC Ratio: 100 %
RV % PRED: 124 %
RV: 3.25 L
TLC % PRED: 94 %
TLC: 5.01 L

## 2014-04-15 NOTE — Progress Notes (Signed)
PFT done today. 

## 2014-04-22 DIAGNOSIS — I480 Paroxysmal atrial fibrillation: Secondary | ICD-10-CM | POA: Diagnosis not present

## 2014-04-22 DIAGNOSIS — R042 Hemoptysis: Secondary | ICD-10-CM | POA: Diagnosis not present

## 2014-04-22 DIAGNOSIS — I272 Other secondary pulmonary hypertension: Secondary | ICD-10-CM | POA: Diagnosis not present

## 2014-04-22 DIAGNOSIS — Z1389 Encounter for screening for other disorder: Secondary | ICD-10-CM | POA: Diagnosis not present

## 2014-04-22 DIAGNOSIS — Z6822 Body mass index (BMI) 22.0-22.9, adult: Secondary | ICD-10-CM | POA: Diagnosis not present

## 2014-04-22 DIAGNOSIS — R0609 Other forms of dyspnea: Secondary | ICD-10-CM | POA: Diagnosis not present

## 2014-04-23 ENCOUNTER — Other Ambulatory Visit: Payer: Self-pay | Admitting: Internal Medicine

## 2014-04-23 DIAGNOSIS — R042 Hemoptysis: Secondary | ICD-10-CM

## 2014-04-24 ENCOUNTER — Other Ambulatory Visit: Payer: Medicare Other

## 2014-04-25 ENCOUNTER — Ambulatory Visit
Admission: RE | Admit: 2014-04-25 | Discharge: 2014-04-25 | Disposition: A | Discharge status: Home / Self Care | Payer: Medicare Other | Source: Ambulatory Visit | Attending: Internal Medicine | Admitting: Internal Medicine

## 2014-04-25 DIAGNOSIS — R042 Hemoptysis: Secondary | ICD-10-CM | POA: Diagnosis not present

## 2014-04-25 DIAGNOSIS — R918 Other nonspecific abnormal finding of lung field: Secondary | ICD-10-CM | POA: Diagnosis not present

## 2014-04-25 DIAGNOSIS — J432 Centrilobular emphysema: Secondary | ICD-10-CM | POA: Diagnosis not present

## 2014-04-25 DIAGNOSIS — K449 Diaphragmatic hernia without obstruction or gangrene: Secondary | ICD-10-CM | POA: Diagnosis not present

## 2014-04-30 ENCOUNTER — Telehealth: Payer: Self-pay | Admitting: *Deleted

## 2014-04-30 DIAGNOSIS — Z79899 Other long term (current) drug therapy: Secondary | ICD-10-CM

## 2014-04-30 NOTE — Telephone Encounter (Signed)
Pt notified about PFT results with verbal understanding. PFT to be repeated in 6 months, pt agreeable to Plan of Care.

## 2014-05-18 ENCOUNTER — Other Ambulatory Visit: Payer: Self-pay | Admitting: Internal Medicine

## 2014-05-23 ENCOUNTER — Other Ambulatory Visit: Payer: Self-pay

## 2014-05-23 DIAGNOSIS — Z1231 Encounter for screening mammogram for malignant neoplasm of breast: Secondary | ICD-10-CM

## 2014-06-03 ENCOUNTER — Ambulatory Visit
Admission: RE | Admit: 2014-06-03 | Discharge: 2014-06-03 | Disposition: A | Discharge status: Home / Self Care | Payer: Medicare Other | Source: Ambulatory Visit

## 2014-06-03 DIAGNOSIS — Z1231 Encounter for screening mammogram for malignant neoplasm of breast: Secondary | ICD-10-CM | POA: Diagnosis not present

## 2014-06-12 ENCOUNTER — Encounter: Payer: Self-pay | Admitting: Internal Medicine

## 2014-06-12 ENCOUNTER — Ambulatory Visit (INDEPENDENT_AMBULATORY_CARE_PROVIDER_SITE_OTHER): Payer: Medicare Other | Admitting: Internal Medicine

## 2014-06-12 VITALS — BP 122/72 | HR 92 | Ht 66.5 in | Wt 138.4 lb

## 2014-06-12 DIAGNOSIS — I480 Paroxysmal atrial fibrillation: Secondary | ICD-10-CM | POA: Diagnosis not present

## 2014-06-12 DIAGNOSIS — I481 Persistent atrial fibrillation: Secondary | ICD-10-CM

## 2014-06-12 DIAGNOSIS — I4819 Other persistent atrial fibrillation: Secondary | ICD-10-CM

## 2014-06-12 NOTE — Patient Instructions (Signed)
Your physician recommends that you schedule a follow-up appointment in: 3 months with Dr Rayann Heman   Your physician has recommended you make the following change in your medication:  1) Stop Amiodarone   Get appointment for Coumadin Clinic---patient wants to change from Xarelto to Warfarin

## 2014-06-12 NOTE — Progress Notes (Deleted)
PCP: [name], MD Primary Cardiologist:  Previously Dr Haroldine Laws  Nancy [name] is a 81 y.o. female who presents today for routine electrophysiology followup.  Since last being seen in our clinic, the patient reports one fall 2 months back. She was outside walking and next thing she was aware of, she was on the ground. She is not sure if she passed out or simply tripped and fell  No further episodes. EKG reveals that the patient in in afib today, rate controlled from which she is asymptomatic.  Her biggest concern today is that her NOAC is now costing her over $300 a month and she wants to return to coumadin use. Used in the past without difficulty. Had been on lasix two times a week but ran out several weeks ago and seems to not show any evidence of fluid retention without its use. Today, she denies symptoms of palpitations, chest pain, lower extremity edema, presyncope, or syncope.    The patient is otherwise without complaint today.   Past Medical History  Diagnosis Date  . Migraine   . GERD (gastroesophageal reflux disease)   . Dyslipidemia   . History of scarlet fever   . PAF (paroxysmal atrial fibrillation)   . Depression   . Melanoma   . Mitral regurgitation     Echo (11/15):  EF 50-55%, Gr 2 DD, MAC, prolapsing P2 segment of post MV leaflet with mod to possibly severe MR, mild LAE, normal RVF, PASP 35 mmHg  . Hx of cardiovascular stress test     Lexiscan Myoview (12/15): No scar, no ischemia, EF 59%; Low Risk   Past Surgical History  Procedure Laterality Date  . Tonsillectomy    . Appendectomy    . Cataract extraction      bilateral  . Cesarean section      Current Outpatient Prescriptions  Medication Sig Dispense Refill  . B Complex Vitamins (B COMPLEX 100 PO) Take 1 tablet by mouth daily.    . calcium-vitamin D (OSCAL WITH D 500-200) 500-200 MG-UNIT per tablet Take 1 tablet by mouth daily.      . Coenzyme Q10 (CO Q 10) 100 MG CAPS Take 1 capsule by mouth daily.     .  Multiple Vitamins-Minerals (CENTRUM SILVER PO) Take 1 tablet by mouth daily.    . vitamin B-12 (CYANOCOBALAMIN) 100 MCG tablet Take 100 mcg by mouth daily.    Alveda Reasons 15 MG TABS tablet TAKE 1 TABLET (15 MG TOTAL) BY MOUTH DAILY. 30 tablet 5  . furosemide (LASIX) 20 MG tablet Take 1 tablet (20 mg total) by mouth 2 (two) times a week. (Patient not taking: Reported on 06/12/2014) 30 tablet 3   No current facility-administered medications for this visit.    Physical Exam: Filed Vitals:   06/12/14 1147  BP: 122/72  Pulse: 92  Height: 5' 6.5" (1.689 m)  Weight: 138 lb 6.4 oz (62.778 kg)    GEN- The patient is well appearing, alert and oriented x 3 today.   Head- normocephalic, atraumatic Eyes-  Sclera clear, conjunctiva pink Ears- hearing intact Oropharynx- clear Lungs- Clear to ausculation bilaterally, normal work of breathing Heart- Irregular rate and rhythm, no murmurs, rubs or gallops, PMI not laterally displaced GI- soft, NT, ND, + BS Extremities- no clubbing, cyanosis, or edema  ekg today reveals afib at 92 bpm, septal infarct, age undetermined.  Assessment and Plan:  1. Persistent afib Stop amiodarone Continue anticoagulation, but refer to coumadin clinic to switch to coumadin  from xarelto due to cost issues chads2vasc score is at least 3  2. Fall vrs syncope 2 months ago  If recurs, will need to report to office and wear a 30 event monitor.  F/u Afib clinic in 3 months

## 2014-06-13 NOTE — Progress Notes (Signed)
PCP: [name], MD Primary Cardiologist:  Previously Dr Haroldine Laws  Nancy [name] is a 81 y.o. female who presents today for routine electrophysiology followup.  Since last being seen in our clinic, the patient reports one fall 2 months back. She was outside walking and next thing she was aware of, she was on the ground. She is not sure if she passed out or simply tripped and fell  No further episodes. EKG reveals that the patient in in afib today, rate controlled from which she is asymptomatic.  Her biggest concern today is that her NOAC is now costing her over $300 a month and she wants to return to coumadin use. Used in the past without difficulty. Had been on lasix two times a week but ran out several weeks ago and seems to not show any evidence of fluid retention without its use. Today, she denies symptoms of palpitations, chest pain, lower extremity edema, presyncope, or syncope.    The patient is otherwise without complaint today.   Past Medical History  Diagnosis Date  . Migraine   . GERD (gastroesophageal reflux disease)   . Dyslipidemia   . History of scarlet fever   . PAF (paroxysmal atrial fibrillation)   . Depression   . Melanoma   . Mitral regurgitation     Echo (11/15):  EF 50-55%, Gr 2 DD, MAC, prolapsing P2 segment of post MV leaflet with mod to possibly severe MR, mild LAE, normal RVF, PASP 35 mmHg  . Hx of cardiovascular stress test     Lexiscan Myoview (12/15): No scar, no ischemia, EF 59%; Low Risk   Past Surgical History  Procedure Laterality Date  . Tonsillectomy    . Appendectomy    . Cataract extraction      bilateral  . Cesarean section      Current Outpatient Prescriptions  Medication Sig Dispense Refill  . B Complex Vitamins (B COMPLEX 100 PO) Take 1 tablet by mouth daily.    . calcium-vitamin D (OSCAL WITH D 500-200) 500-200 MG-UNIT per tablet Take 1 tablet by mouth daily.      . Coenzyme Q10 (CO Q 10) 100 MG CAPS Take 1 capsule by mouth daily.     .  Multiple Vitamins-Minerals (CENTRUM SILVER PO) Take 1 tablet by mouth daily.    . vitamin B-12 (CYANOCOBALAMIN) 100 MCG tablet Take 100 mcg by mouth daily.    Alveda Reasons 15 MG TABS tablet TAKE 1 TABLET (15 MG TOTAL) BY MOUTH DAILY. 30 tablet 5  . furosemide (LASIX) 20 MG tablet Take 1 tablet (20 mg total) by mouth 2 (two) times a week. (Patient not taking: Reported on 06/12/2014) 30 tablet 3   No current facility-administered medications for this visit.    Physical Exam: Filed Vitals:   06/12/14 1147  BP: 122/72  Pulse: 92  Height: 5' 6.5" (1.689 m)  Weight: 138 lb 6.4 oz (62.778 kg)    GEN- The patient is well appearing, alert and oriented x 3 today.   Head- normocephalic, atraumatic Eyes-  Sclera clear, conjunctiva pink Ears- hearing intact Oropharynx- clear Lungs- Clear to ausculation bilaterally, normal work of breathing Heart- Irregular rate and rhythm, no murmurs, rubs or gallops, PMI not laterally displaced GI- soft, NT, ND, + BS Extremities- no clubbing, cyanosis, or edema  ekg today reveals afib at 92 bpm, septal infarct, age undetermined.  Assessment and Plan:  1. Persistent afib Stop amiodarone and rate control long term Continue anticoagulation, but refer to coumadin  clinic to switch to coumadin from xarelto due to cost issues chads2vasc score is at least 3  2. Fall vrs syncope 2 months ago  Today, I have advised 30 day event monitor.  She is very clear at this time that she does not wish for further evaluation.  She would prefer a more conservative course. If recurs, will need to report to office and wear a 30 event monitor.  F/u Afib clinic in 3 months

## 2014-06-19 DIAGNOSIS — Z961 Presence of intraocular lens: Secondary | ICD-10-CM | POA: Diagnosis not present

## 2014-06-19 DIAGNOSIS — H18003 Unspecified corneal deposit, bilateral: Secondary | ICD-10-CM | POA: Diagnosis not present

## 2014-06-19 DIAGNOSIS — H26492 Other secondary cataract, left eye: Secondary | ICD-10-CM | POA: Diagnosis not present

## 2014-06-19 DIAGNOSIS — H524 Presbyopia: Secondary | ICD-10-CM | POA: Diagnosis not present

## 2014-06-27 ENCOUNTER — Telehealth: Payer: Self-pay | Admitting: Internal Medicine

## 2014-06-27 NOTE — Telephone Encounter (Signed)
Left patient a message to call back. On office visit 06/12/2014 patient had in her instructions: Get appointment for Coumadin Clinic---patient wants to change from Xarelto to Warfarin Sent Coumadin clinic a staff message to call patient for an appointment. Will forward to Elberta Leatherwood the pharmacist.

## 2014-06-27 NOTE — Telephone Encounter (Signed)
-----   Message from Dalphine Handing, RN sent at 06/27/2014 10:45 AM EDT ----- Patient needs an appointment with coumadin clinic. Patient switching from xarelto to coumadin. No recent INR on record.  Thank you,  Ewell Poe RN

## 2014-06-27 NOTE — Telephone Encounter (Signed)
Spoke with pt, advised we will need to send in new rx for Warfarin and she will need to overlap Xarelto and Warfarin x 3 days, then d/c Xarelto and continue on Warfarin.  We will need to check in INR in approx 5 days after starting.  Pt states she has been on Warfarin in the past years ago, but does not remember what dosage she was on.  Pt was not monitored here in our clinic.  Pt states she has almost a full rx bottle of Xarelto.  She is not having any problems with the Xarelto, it is just to expensive.  Pt is going to call back when she has almost completed current rx of Xarelto, and has 7-10 days left for [place] to send in rx of Warfarin and schedule f/u appt in clinic.  Pt verbalized understanding.

## 2014-06-27 NOTE — Telephone Encounter (Signed)
New Message        Pt calling stating that Dr. Rayann Heman put her on Warfarin instead of Zorelto but pt never received prescription. Please call back and advise.

## 2014-06-27 NOTE — Telephone Encounter (Signed)
This encounter was created in error - please disregard.

## 2014-07-11 DIAGNOSIS — E559 Vitamin D deficiency, unspecified: Secondary | ICD-10-CM | POA: Diagnosis not present

## 2014-07-11 DIAGNOSIS — E785 Hyperlipidemia, unspecified: Secondary | ICD-10-CM | POA: Diagnosis not present

## 2014-07-30 DIAGNOSIS — Z0000 Encounter for general adult medical examination without abnormal findings: Secondary | ICD-10-CM | POA: Diagnosis not present

## 2014-07-30 DIAGNOSIS — Z23 Encounter for immunization: Secondary | ICD-10-CM | POA: Diagnosis not present

## 2014-07-30 DIAGNOSIS — I6529 Occlusion and stenosis of unspecified carotid artery: Secondary | ICD-10-CM | POA: Diagnosis not present

## 2014-07-30 DIAGNOSIS — K760 Fatty (change of) liver, not elsewhere classified: Secondary | ICD-10-CM | POA: Diagnosis not present

## 2014-07-30 DIAGNOSIS — E041 Nontoxic single thyroid nodule: Secondary | ICD-10-CM | POA: Diagnosis not present

## 2014-07-30 DIAGNOSIS — N183 Chronic kidney disease, stage 3 (moderate): Secondary | ICD-10-CM | POA: Diagnosis not present

## 2014-07-30 DIAGNOSIS — R942 Abnormal results of pulmonary function studies: Secondary | ICD-10-CM | POA: Diagnosis not present

## 2014-07-30 DIAGNOSIS — R739 Hyperglycemia, unspecified: Secondary | ICD-10-CM | POA: Diagnosis not present

## 2014-08-01 ENCOUNTER — Other Ambulatory Visit: Payer: Self-pay | Admitting: Internal Medicine

## 2014-08-01 DIAGNOSIS — E041 Nontoxic single thyroid nodule: Secondary | ICD-10-CM

## 2014-08-05 ENCOUNTER — Ambulatory Visit
Admission: RE | Admit: 2014-08-05 | Discharge: 2014-08-05 | Disposition: A | Discharge status: Home / Self Care | Payer: Medicare Other | Source: Ambulatory Visit | Attending: Internal Medicine | Admitting: Internal Medicine

## 2014-08-05 DIAGNOSIS — E042 Nontoxic multinodular goiter: Secondary | ICD-10-CM | POA: Diagnosis not present

## 2014-08-05 DIAGNOSIS — E041 Nontoxic single thyroid nodule: Secondary | ICD-10-CM

## 2014-08-06 DIAGNOSIS — Z1212 Encounter for screening for malignant neoplasm of rectum: Secondary | ICD-10-CM | POA: Diagnosis not present

## 2014-08-08 ENCOUNTER — Telehealth: Payer: Self-pay | Admitting: Internal Medicine

## 2014-08-08 NOTE — Telephone Encounter (Signed)
Spoke with patient and today she says she has been extremely weak.  Was seen last for bad cough at PCP and was started on Prednisone and something else for 3 days.  She doesn't know what the medication was.  The cough is better but just feels weak and SOB.  She went to exercise and was told to call the cardiologist.  Will have her seen by an extender soon.  Melissa will call her to schedule

## 2014-08-08 NOTE — Telephone Encounter (Signed)
New message      Pt c/o Shortness Of Breath: STAT if SOB developed within the last 24 hours or pt is noticeably SOB on the phone  1. Are you currently SOB (can you hear that pt is SOB on the phone)? no 2. How long have you been experiencing SOB? Several days--------today is worse.  (Pt states she has had sob off and on for a good while) 3. Are you SOB when sitting or when up moving around? sitting 4. Are you currently experiencing any other symptoms?  Fatigue Pt want to talk it over with the nurse

## 2014-08-09 ENCOUNTER — Ambulatory Visit (INDEPENDENT_AMBULATORY_CARE_PROVIDER_SITE_OTHER): Payer: Medicare Other | Admitting: Physician Assistant

## 2014-08-09 ENCOUNTER — Encounter: Payer: Self-pay | Admitting: Physician Assistant

## 2014-08-09 VITALS — BP 115/65 | HR 66 | Ht 66.5 in | Wt 133.8 lb

## 2014-08-09 DIAGNOSIS — I481 Persistent atrial fibrillation: Secondary | ICD-10-CM | POA: Diagnosis not present

## 2014-08-09 DIAGNOSIS — I5032 Chronic diastolic (congestive) heart failure: Secondary | ICD-10-CM | POA: Diagnosis not present

## 2014-08-09 DIAGNOSIS — I340 Nonrheumatic mitral (valve) insufficiency: Secondary | ICD-10-CM | POA: Diagnosis not present

## 2014-08-09 DIAGNOSIS — R0902 Hypoxemia: Secondary | ICD-10-CM

## 2014-08-09 DIAGNOSIS — R942 Abnormal results of pulmonary function studies: Secondary | ICD-10-CM

## 2014-08-09 DIAGNOSIS — I4819 Other persistent atrial fibrillation: Secondary | ICD-10-CM

## 2014-08-09 DIAGNOSIS — R0600 Dyspnea, unspecified: Secondary | ICD-10-CM | POA: Diagnosis not present

## 2014-08-09 LAB — BASIC METABOLIC PANEL
BUN: 19 mg/dL (ref 6–23)
CALCIUM: 9.4 mg/dL (ref 8.4–10.5)
CHLORIDE: 101 meq/L (ref 96–112)
CO2: 32 mEq/L (ref 19–32)
CREATININE: 1.36 mg/dL — AB (ref 0.40–1.20)
GFR: 39.12 mL/min — AB (ref >60.00)
Glucose, Bld: 102 mg/dL — ABNORMAL HIGH (ref 70–99)
POTASSIUM: 4.4 meq/L (ref 3.5–5.1)
SODIUM: 137 meq/L (ref 135–145)

## 2014-08-09 LAB — BRAIN NATRIURETIC PEPTIDE: Pro B Natriuretic peptide (BNP): 269 pg/mL — ABNORMAL HIGH (ref 0.0–100.0)

## 2014-08-09 MED ORDER — FUROSEMIDE 20 MG PO TABS: 20.0000 mg | ORAL_TABLET | Freq: Every day | ORAL | Status: DC

## 2014-08-09 NOTE — Progress Notes (Addendum)
Cardiology Office Note   Date:  08/09/2014   ID:  Nancy [name], DOB 1925/09/28, MRN [id number]  PCP:  [name], MD  Cardiologist:  Dr. Thompson Grayer     Chief Complaint  Patient presents with  . Shortness of Breath     History of Present Illness: Nancy [name] is a 81 y.o. female with a hx of PAF controlled on Amiodarone, Xarelto anticoagulation, MVP with mod MR, HL, Migraines, possible TIA. She has a hx of pro-arrhythmia (SVT) on Flecainide >> changed to Amiodarone.  She was seen in the ED 02/2014 for fatigue and dyspnea. She felt her heart rate was irregular. BNP was elevated. Cardiac enzymes were normal. ECG demonstrated NSR. She told me that she felt out of breath for 2 days. It was mainly in the mornings with getting ready. She denied chest discomfort with exertion. She denied syncope. She felt her breathing had worsened over the past 6-12 months (NYHA 2-2b). BNP was slightly elevated. I put her on Lasix twice a week and set her up for a stress test which demonstrated no scar or ischemia. Echocardiogram was also obtained and this demonstrated normal LV function with moderate diastolic dysfunction and moderate to severe mitral regurgitation. I reviewed this with Dr. Rayann Heman and we arranged for PFTs with DLCO and ESR to rule out amiodarone toxicity. This demonstrated moderate obstructive airways disease and moderate diffusion defect. I reviewed this with Dr. Rayann Heman. We felt that this was more likely related to age and not amiodarone and decided to plan on repeat PFTs in 6 months.   Last seen by Dr. Rayann Heman 06/12/14. The patient was back in atrial fibrillation and her amiodarone was stopped. Rate control strategy was chosen. The patient had significant issues with cost of Xarelto and she was changed to Coumadin. She reported a fall 2 months prior. It was unclear whether or not she had a syncopal episode. 30 day event monitor was recommended. The patient declined. She is to follow-up  in atrial fibrillation clinic.   The patient called in recently with fatigue and dyspnea.  She was added on to my schedule for further evaluation.  She tells me that she recently saw her primary care physician for a routine physical. She noted a cough that she's had for 20 years. Her cough had recently gotten worse over the last 2 months with increased sputum production. She was placed on prednisone and another medication (? Antibiotic). Her cough has slowly improved. She feels that it is much better today. She denies fever or purulent sputum. She does note wheezing. She has noted increasing fatigue as well as increasing dyspnea with exertion over the past 4-5 months. Of note, her primary care physician recently referred her for a thyroid ultrasound. This demonstrated a multinodular goiter.  She denies chest pain. She denies orthopnea, PND or edema. She denies syncope. She denies weight gain. She denies a history of snoring. She does note daytime hypersomnolence. Overall, she describes NYHA 2b symptoms.   Studies/Reports Reviewed Today:  - Echo (4/11): EF 55-60%, mod MR, mild to mod LAE - Echo (8/14): EF 55-60%, post MV leaflet prolapse, mod MR, mild LAE, PASP 42 mmHg  - Echo (02/25/14): EF 40-98%, grade 2 diastolic dysfunction, MAC, prolapsing P2 segment of the posterior leaflet with at least moderate and possibly severe MR, mild LAE, normal RV function, PASP 35 mmHg - Nuclear (03/07/14): No scar or ischemia, EF 59%, normal study - Carotid US (4/13): 0-39% bilateral ICA  -  PFTs with DLCO 04/2014: Moderate obstructive airways disease, moderate diffusion defect (FEV1 60%, FEV1/FVC 89%, DLCO 57%)   Past Medical History  Diagnosis Date  . Migraine   . GERD (gastroesophageal reflux disease)   . Dyslipidemia   . History of scarlet fever   . PAF (paroxysmal atrial fibrillation)   . Depression   . Melanoma   . Mitral regurgitation     Echo (11/15):  EF 50-55%, Gr 2 DD, MAC, prolapsing P2  segment of post MV leaflet with mod to possibly severe MR, mild LAE, normal RVF, PASP 35 mmHg  . Hx of cardiovascular stress test     Lexiscan Myoview (12/15): No scar, no ischemia, EF 59%; Low Risk    Past Surgical History  Procedure Laterality Date  . Tonsillectomy    . Appendectomy    . Cataract extraction      bilateral  . Cesarean section       Current Outpatient Prescriptions  Medication Sig Dispense Refill  . B Complex Vitamins (B COMPLEX 100 PO) Take 1 tablet by mouth daily.    . Biotin 1 MG CAPS Take 2 mg by mouth daily.     . calcium-vitamin D (OSCAL WITH D 500-200) 500-200 MG-UNIT per tablet Take 1 tablet by mouth daily.      . furosemide (LASIX) 20 MG tablet Take 1 tablet (20 mg total) by mouth 2 (two) times a week. 30 tablet 3  . Multiple Vitamins-Minerals (CENTRUM SILVER PO) Take 1 tablet by mouth daily.    . predniSONE (DELTASONE) 20 MG tablet Take 20 mg by mouth 2 (two) times daily with a meal.     . vitamin B-12 (CYANOCOBALAMIN) 100 MCG tablet Take 100 mcg by mouth daily.    Marland Kitchen warfarin (COUMADIN) 5 MG tablet Take 5 mg by mouth one time only at 6 PM. Start after you finish the xarelto    . XARELTO 15 MG TABS tablet TAKE 1 TABLET (15 MG TOTAL) BY MOUTH DAILY. 30 tablet 5   No current facility-administered medications for this visit.    Allergies:   Review of patient's allergies indicates no known allergies.    Social History:  The patient  reports that she quit smoking about 46 years ago. Her smoking use included Cigarettes. She quit after 6 years of use. She does not have any smokeless tobacco history on file. She reports that she drinks about 0.6 oz of alcohol per week. She reports that she does not use illicit drugs.   Family History:  The patient's family history includes Colon polyps in an other family member. There is no history of Heart attack, Stroke, or Hypertension.    ROS:   Please see the history of present illness.   Review of Systems    Constitution: Positive for malaise/fatigue.  Cardiovascular: Positive for dyspnea on exertion and irregular heartbeat.  Respiratory: Positive for cough.   Neurological: Positive for loss of balance.  All other systems reviewed and are negative.     PHYSICAL EXAM: VS:  BP 115/65 mmHg  Pulse 66  Ht 5' 6.5" (1.689 m)  Wt 133 lb 12.8 oz (60.691 kg)  BMI 21.27 kg/m2    O2 on room air 99% at rest >> decreased to 87% with ambulation (on room air) O2 on 2 L/m at rest 96%  >> decreased to 93% with ambulation (on 2 L/m)   Wt Readings from Last 3 Encounters:  08/09/14 133 lb 12.8 oz (60.691 kg)  06/12/14 138 lb  6.4 oz (62.778 kg)  03/21/14 135 lb (61.236 kg)     GEN: Well nourished, well developed, in no acute distress HEENT: normal Neck: no JVD,   no masses Cardiac:  Normal S1/S2, RRR; no murmur , + mid systolic click,  no rubs or gallops, no edema  Respiratory:  clear to auscultation bilaterally, no wheezing, rhonchi or rales. GI: soft, nontender, nondistended, + BS MS: no deformity or atrophy Skin: warm and dry  Neuro:  CNs II-XII intact, Strength and sensation are intact Psych: Normal affect   EKG:  EKG is ordered today.  It demonstrates:   NSR, HR 66, normal axis, diffuse ST changes, no change from prior tracings   Recent Labs: 02/11/2014: Hemoglobin 14.4; Platelets 365 02/12/2014: ALT 19; Pro B Natriuretic peptide (BNP) 237.0*; TSH 1.30 03/11/2014: BUN 17; Creatinine 1.3*; Potassium 4.9; Sodium 139    Lipid Panel No results found for: CHOL, TRIG, HDL, CHOLHDL, VLDL, LDLCALC, LDLDIRECT    ASSESSMENT AND PLAN:  1. Dyspnea: Recent Myoview low risk. She is no longer on amiodarone. She does have a chronic cough. Recent PFTs were abnormal with moderate obstructive defect as well as moderately reduced DLCO. She has never really been a smoker. She has a remote history of tobacco use as a teenager. It is not clear if her symptoms of dyspnea are related to significant mitral  regurgitation leading to volume excess versus a primary pulmonary issue. I reviewed her recent echocardiogram with Dr. Ron Parker. We think that it would be worthwhile to repeat her echocardiogram to look for increasing left ventricular end-systolic dimension as well as to assess for worsening MR with flailed mitral valve segment.  She also has hypoxia with ambulation on room air. Given her abnormal PFTs, we think it is best to refer her to pulmonology for further evaluation. I will adjust her Lasix as well to cover for the possibility of volume excess.  -  Arrange repeat 2-D echocardiogram.  -  Increase Lasix to 20 mg daily.  -  BMET, BNP today; repeat BMET one week   -  Arrange oxygen therapy.  -  Refer to pulmonology. 2. Chronic diastolic CHF (congestive heart failure):  As noted, I will adjust her Lasix and keep a close eye on renal function and potassium.  3. Mitral regurgitation: Mod to severe on recent echo. We are not certain she is a candidate for mitral valve  surgery given her advanced age. Echo will be repeated.  If her end systolic internal dimension is unchanged and there is no flailed segment of her MV, medical Rx will be continued.  If there is a significant change, will need to consider options (? If MV clipping would be an option).   4.  Hypoxia:  As noted, with her recent abnormal PFTs, I will refer to pulmonology.  She is off Amiodarone now. 5. Paroxysmal atrial fibrillation: She is back in NSR.  Continue Xarelto.  She plans to transition to Coumadin once she is running out of her Xarelto supply.     Current medicines are reviewed at length with the patient today.  Concerns regarding medicines are as outlined above.  The following changes have been made:    Increase Lasix to 20 mg QD.  Start Oxygen with ambulation.    Labs/ tests ordered today include:   Orders Placed This Encounter  Procedures  . For home use only DME oxygen  . Basic Metabolic Panel (BMET)  . B Nat  Peptide  . Basic Metabolic  Panel (BMET)  . Ambulatory referral to Pulmonology  . EKG 12-Lead  . Echocardiogram    Disposition:   I spent 45 minutes with the patient today (including evaluation and coordinating care).  FU with Dr. Thompson Grayer 3-4 weeks.    Signed, Versie Starks, MHS 08/09/2014 12:32 PM    [facility] [address], [place], [place]  [address] Phone: [phone]; Fax: [phone]

## 2014-08-09 NOTE — Patient Instructions (Signed)
Medication Instructions:  1. INCREASE LASIX 20 MG DAILY; NEW RX SENT IN  Labwork: 1. TODAY BMET, BNP 2. REPEAT BMET IN 1 WEEK  Testing/Procedures: Your physician has requested that you have an echocardiogram. Echocardiography is a painless test that uses sound waves to create images of your heart. It provides your doctor with information about the size and shape of your heart and how well your heart's chambers and valves are working. This procedure takes approximately one hour. There are no restrictions for this procedure.  Follow-Up: DR. Rayann Heman 09/05/14 @ 10:45  Any Other Special Instructions Will Be Listed Below (If Applicable). A REFERRAL PULMONOLOGY DX ABNORMAL PFT'S AND HYPOXIA

## 2014-08-14 ENCOUNTER — Other Ambulatory Visit (HOSPITAL_COMMUNITY): Payer: Medicare Other

## 2014-08-16 ENCOUNTER — Other Ambulatory Visit: Payer: Self-pay

## 2014-08-16 ENCOUNTER — Ambulatory Visit (HOSPITAL_COMMUNITY): Payer: Medicare Other | Attending: Internal Medicine

## 2014-08-16 DIAGNOSIS — I340 Nonrheumatic mitral (valve) insufficiency: Secondary | ICD-10-CM

## 2014-08-16 DIAGNOSIS — I4891 Unspecified atrial fibrillation: Secondary | ICD-10-CM | POA: Insufficient documentation

## 2014-08-16 DIAGNOSIS — I081 Rheumatic disorders of both mitral and tricuspid valves: Secondary | ICD-10-CM | POA: Insufficient documentation

## 2014-08-19 ENCOUNTER — Encounter: Payer: Self-pay | Admitting: Physician Assistant

## 2014-08-19 ENCOUNTER — Telehealth: Payer: Self-pay | Admitting: Internal Medicine

## 2014-08-19 NOTE — Telephone Encounter (Signed)
Spoke with patient and she says oxygen was dropped off and she's never been instructed on how to use it.  She does have an appointment on 08/27/14 with Dr Melvyn Novas  She wants to know what to do with the oxygen until then  I let her know I would send to Richardson Dopp and Julaine Hua to clarify her qusetion

## 2014-08-19 NOTE — Telephone Encounter (Signed)
She has already been instructed to wear it when active and at bedtime. She should use 2 LPM. Richardson Dopp, PA-C   08/19/2014 9:29 PM

## 2014-08-19 NOTE — Telephone Encounter (Signed)
New Message   Patient was sent an O2 tank and is confused and unsure of what to do with it.  Please give patient a call back.

## 2014-08-20 NOTE — Telephone Encounter (Signed)
Pt notified of echo results with verbal understanding by phone. Pt advised as well how to use O2 2 L when active and at bedtime. Pt wants to see about getting a small portable tank to go out side. Per Brynda Rim PA we will call [place].

## 2014-08-21 ENCOUNTER — Other Ambulatory Visit: Payer: Medicare Other | Admitting: *Deleted

## 2014-08-21 DIAGNOSIS — I5032 Chronic diastolic (congestive) heart failure: Secondary | ICD-10-CM

## 2014-08-21 DIAGNOSIS — R0602 Shortness of breath: Secondary | ICD-10-CM

## 2014-08-23 ENCOUNTER — Telehealth: Payer: Self-pay | Admitting: Internal Medicine

## 2014-08-23 NOTE — Telephone Encounter (Signed)
New Message  Pt called states that she is switching to warfarin and she is not sure how often she is supposed to take it. Please call back for clarification//sr

## 2014-08-23 NOTE — Telephone Encounter (Signed)
5. Paroxysmal atrial fibrillation: She is back in NSR. Continue Xarelto. She plans to transition to Coumadin once she is running out of her Xarelto supply.   I have left her a message that she can start her Coumadin the night after she stops her Xarelto and will need an appointment in Coumadin Clinic a few days after starting her med  I have asked her to call me on Monday to discuss what dose she has and so we can get her scheduled in CVRR clinic

## 2014-08-27 ENCOUNTER — Encounter: Payer: Self-pay | Admitting: Internal Medicine

## 2014-08-27 ENCOUNTER — Ambulatory Visit (INDEPENDENT_AMBULATORY_CARE_PROVIDER_SITE_OTHER): Payer: Medicare Other | Admitting: Internal Medicine

## 2014-08-27 ENCOUNTER — Ambulatory Visit (INDEPENDENT_AMBULATORY_CARE_PROVIDER_SITE_OTHER)
Admission: RE | Admit: 2014-08-27 | Discharge: 2014-08-27 | Disposition: A | Discharge status: Home / Self Care | Payer: Medicare Other | Source: Ambulatory Visit | Attending: Internal Medicine | Admitting: Internal Medicine

## 2014-08-27 VITALS — BP 140/80 | HR 80 | Ht 66.5 in | Wt 132.0 lb

## 2014-08-27 DIAGNOSIS — R05 Cough: Secondary | ICD-10-CM | POA: Diagnosis not present

## 2014-08-27 DIAGNOSIS — R059 Cough, unspecified: Secondary | ICD-10-CM

## 2014-08-27 DIAGNOSIS — J449 Chronic obstructive pulmonary disease, unspecified: Secondary | ICD-10-CM | POA: Diagnosis not present

## 2014-08-27 DIAGNOSIS — R0602 Shortness of breath: Secondary | ICD-10-CM | POA: Diagnosis not present

## 2014-08-27 MED ORDER — PANTOPRAZOLE SODIUM 40 MG PO TBEC: 40.0000 mg | DELAYED_RELEASE_TABLET | Freq: Every day | ORAL | Status: DC

## 2014-08-27 MED ORDER — FAMOTIDINE 20 MG PO TABS: ORAL_TABLET | ORAL | Status: DC

## 2014-08-27 NOTE — Patient Instructions (Addendum)
Strongly rec:  Pantoprazole (protonix) 40 mg   Take  30-60 min before first meal of the day and Pepcid (famotidine)  20 mg one @  bedtime until return to see Dr Virgina Jock  GERD (REFLUX)  is an extremely common cause of respiratory symptoms just like yours , many times with no obvious heartburn at all.    It can be treated with medication, but also with lifestyle changes including avoidance of late meals, elevation of the head of your bed (ideally with 6 inch  bed blocks) excessive alcohol, smoking cessation, and avoid fatty foods, chocolate, peppermint, colas, red wine, and acidic juices such as orange juice.  NO MINT OR MENTHOL PRODUCTS SO NO COUGH DROPS  USE SUGARLESS CANDY INSTEAD (Jolley ranchers or Stover's or Life Savers) or even ice chips will also do - the key is to swallow to prevent all throat clearing. NO OIL BASED VITAMINS - use powdered substitutes.  No need for 02 from my perspective  Please remember to go to the  x-ray department downstairs for your tests - we will call you with the results when they are available.    No need for pulmonary follow up - I will send Dr Virgina Jock a copy of this note and explain why pulmonary follow up is unlikely to help you

## 2014-08-27 NOTE — Progress Notes (Signed)
Subjective:    Patient ID: Nancy [name], female    DOB: [birth year]   MRN: [id number]  Brief patient profile:  81 yowf quit smoking age 81 c no resp symptoms at all  referred 07/13/2012 by Dr Timothy Lasso for eval of chronic cough and proved to have GOLD II COPD 01/19/13    History of Present Illness  07/13/2012  1st pulmonary eval/ Nancy [name] cc year round cough present daily x 20 plus years with more sputum production in am x one year > white mucus less than tsp assoc with doe when bending over and picking up things but able work out regularly at the [facility] s difficulty. rec gerd rx/ diet/ pred x 6 days  10/26/2012 Follow up and med review Patient returns for a two-week followup and medication review. We reviewed all her medications and organized them into a medication calendar with patient education. It appears the patient is taking her medications correctly.  patient was seen 2 weeks ago and given a prednisone taper. She reports that her cough is some better.  rec Follow med calendar closely and bring to each visit    01/22/2013 f/u ov/Nancy [name] re: cough/ did not bring med calendar / not following the one I showed her / never got H1 Chief Complaint  Patient presents with  . Follow-up    Persistant cough w/ & w/o mucus  Not limited by sob "nothing you've done has helped but it's not that big a deal anyway".   rec Try h1 for pnds> not clear she followed any of the instructions    08/27/2014 ov/Nancy [name]  pulmonary consultation requested by cardiology ? Hypoxemic?   Chief Complaint  Patient presents with  . Follow-up    Pt referred back here per Tereso Newcomer, PA. Pt states unsure why she was told to come in. She was started on o2 approx 1 wk ago. She states she uses it at night and when she is at home. She c/o SOB when she wakes up in the am, has good days and bad days.   when uses 02 not clearly better  Does ex class / gentle fitness s 02 s problem  Hip stops before breathing stops her, can walk across  parking lots/ do malls ok Occasionally steps cause sob but not really limited  Clearing her throat x years, usually dry and daytime, no better cough or sob p Prednisone rx in past per Dr Timothy Lasso  Worse problems occur every am p stir around and last all morning every day (note this office vist at 9am)   No obvious day to day or daytime variabilty or assoc excess/ purulent sputum or   cp or chest tightness, subjective wheeze overt sinus or hb symptoms. No unusual exp hx or h/o childhood pna/ asthma or knowledge of premature birth.  Sleeping ok without nocturnal  or early am exacerbation  of respiratory  c/o's or need for noct saba. Also denies any obvious fluctuation of symptoms with weather or environmental changes or other aggravating or alleviating factors except as outlined above   Current Medications, Allergies, Complete Past Medical History, Past Surgical History, Family History, and Social History were reviewed in [employer] record.  ROS  The following are not active complaints unless bolded sore throat, dysphagia, dental problems, itching, sneezing,  nasal congestion or excess/ purulent secretions, ear ache,   fever, chills, sweats, unintended wt loss, pleuritic or exertional cp, hemoptysis,  orthopnea pnd or leg swelling, presyncope, palpitations, heartburn, abdominal pain,  anorexia, nausea, vomiting, diarrhea  or change in bowel or urinary habits, change in stools or urine, dysuria,hematuria,  rash, arthralgias, visual complaints, headache, numbness weakness or ataxia or problems with walking or coordination,  change in mood/affect or memory.             Objective:   Physical Exam  amb wf  nad /  Classic voice fatigue  08/11/2012 138 > 10/04/2012 132  >132 10/26/2012 > 01/22/2013 134 > 08/27/2014  132   HEENT: nl dentition, turbinates, and orophanx. Nl external ear canals without cough reflex   NECK :  without JVD/Nodes/TM/ nl carotid upstrokes  bilaterally   LUNGS: no acc muscle use,  Prominent pseudowheeze    CV:  RRR  no s3 or murmur or increase in P2, no edema   ABD:  soft and nontender with nl excursion in the supine position. No bruits or organomegaly, bowel sounds nl  MS:  warm without deformities, calf tenderness, cyanosis or clubbing    CXR PA and Lateral:   08/27/2014 :     I personally reviewed images and agree with radiology impression as follows:     Mediastinum hilar structures are stable. Stable cardiomegaly with normal pulmonary vascularity. Lungs are clear of acute infiltrates. Previously identified pulmonary nodules are best demonstrated by CT. Reference is made to CT report 04/25/2014. Stable apical pleural parenchymal thickening consistent with scarring. No acute bony abnormality.              Assessment & Plan:

## 2014-08-27 NOTE — Progress Notes (Signed)
Quick Note:  Spoke with pt and notified of results per Dr. Wert. Pt verbalized understanding and denied any questions.  ______ 

## 2014-08-28 ENCOUNTER — Encounter: Payer: Self-pay | Admitting: Internal Medicine

## 2014-08-28 NOTE — Assessment & Plan Note (Signed)
Her early am symptoms are suggestive of copd /cb but note not improvement with previous rx including steroids.  If this problem fails to improve to her satisfaction then I would be happy to see her back and work through the problem in a step wise fashion, making sure she follows each step correctly before we take the next  See instructions for specific recommendations which were reviewed directly with the patient who was given a copy with highlighter outlining the key components.

## 2014-08-28 NOTE — Telephone Encounter (Signed)
New Message  Pt request a call back to discuss if she is due for a warfarin appt. She says that she hasnt had it for a while and would like a call back to discuss.

## 2014-08-28 NOTE — Assessment & Plan Note (Addendum)
-   PFTs 01/19/2013     FEV1  1.19 (61%) ratio 69 and no change p B2, DLCO 67 corrects to 103% - PFTs  04/15/14          FEV1  1.21 (64%) ratio 71 p  no sign change p B2 and dlco 57 corrects to 92% - spirometry 08/27/14   FEV1 0.99 (50%) ratio 69  -- 08/28/2014  Walked RA x 3 laps @ 185 ft each stopped due to end of study, no sob, no desats, nl pace  I had an extended summary discussion with the patient reviewing all relevant studies completed to date and  lasting 15 to 20 minutes of a 25 minute visit on the following ongoing concerns:   As I explained to this patient in detail:  although there may be copd present, it may not be clinically relevant:   it does not appear to be limiting activity tolerance any more than a set of worn tires limits someone from driving a car  around a parking lot.  A new set of Michelins might look good but would have no perceived impact on the performance of the car and would not be worth the cost.  That is to say:  I don't recommend aggressive pulmonary rx at this point unless limiting symptoms arise or acute exacerbations become as issue, neither of which is the case now.  I asked the patient to contact this office at any time in the future should either of these problems arise.

## 2014-08-28 NOTE — Assessment & Plan Note (Addendum)
Not able to reproduce her symptoms previously reported to occur "every morning" here in the office and she was the first patient of the morning. Therefore Symptoms are markedly disproportionate to objective findings and not clear this is a lung problem but pt does appear to have difficult airway management issues.   DDX of  difficult airways management all start with A and  include Adherence, Ace Inhibitors, Acid Reflux, Active Sinus Disease, Alpha 1 Antitripsin deficiency, Anxiety masquerading as Airways dz,  ABPA,  allergy(esp in young), Aspiration (esp in elderly), Adverse effects of DPI,  Active smokers, plus two Bs  = Bronchiectasis and Beta blocker use..and one C= CHF  Adherence is always the initial "prime suspect" and is a multilayered concern that requires a "trust but verify" approach in every patient - starting with knowing how to use medications, especially inhalers, correctly, keeping up with refills and understanding the fundamental difference between maintenance and prns vs those medications only taken for a very short course and then stopped and not refilled.  - she clearly does not follow instructions consistently enough to know whether any treatment plan is effective.  - discussed with pt:  Unlike when you get a prescription for eyeglasses, it's not possible to always walk out of this or any medical office with a perfect prescription that is immediately effective  based on any test that we offer here.   On the contrary, it may take several weeks for the full impact of changes recommened today - hopefully you will respond well.  If not, then we'll adjust your medication on your next visit accordingly, knowing more then than we can possibly know now.      ? Acid (or non-acid) GERD > always difficult to exclude as up to 75% of pts in some series report no assoc GI/ Heartburn symptoms> rec max (24h)  acid suppression and diet restrictions/ reviewed and instructions given in writing.   ?  Anxiety/ depression > I think this is a major contributor but is dx of exclusion and defer eval/ rx to primaty care   ? Chf> defer rx to cards but absence of orthopnea/noct awakening or reproducible doe here rules against this   Lab Results  Component Value Date   PROBNP 269.0* 08/09/2014     I see no indication at all for 02 and suspect previous result was spurious and related to use of fingernail polish (we circumvented that issue by using our head probe).  Note in January of 2016 she had a dlco 57% which does not correlate with 02 desat exept with a level of exertion well beyond her level of desired activity    See instructions for specific recommendations which were reviewed directly with the patient who was given a copy with highlighter outlining the key components.

## 2014-08-28 NOTE — Telephone Encounter (Signed)
Pt states she has only 1 Xarelto left and instructed to take Xarelto today as well as starting coumadin 5mg  daily today  and to take coumadin 5mg  daily at 6pm and appt made for her to be checked in coumadin clinic on June 2nd and she states understanding

## 2014-09-03 ENCOUNTER — Other Ambulatory Visit: Payer: Self-pay | Admitting: *Deleted

## 2014-09-03 DIAGNOSIS — R0602 Shortness of breath: Secondary | ICD-10-CM

## 2014-09-05 ENCOUNTER — Ambulatory Visit: Payer: Medicare Other | Admitting: Internal Medicine

## 2014-09-05 ENCOUNTER — Ambulatory Visit (INDEPENDENT_AMBULATORY_CARE_PROVIDER_SITE_OTHER): Payer: Medicare Other | Admitting: *Deleted

## 2014-09-05 DIAGNOSIS — Z5181 Encounter for therapeutic drug level monitoring: Secondary | ICD-10-CM | POA: Diagnosis not present

## 2014-09-05 DIAGNOSIS — I340 Nonrheumatic mitral (valve) insufficiency: Secondary | ICD-10-CM | POA: Diagnosis not present

## 2014-09-05 DIAGNOSIS — I4819 Other persistent atrial fibrillation: Secondary | ICD-10-CM

## 2014-09-05 DIAGNOSIS — I481 Persistent atrial fibrillation: Secondary | ICD-10-CM

## 2014-09-05 LAB — POCT INR: INR: 5.9

## 2014-09-05 NOTE — Patient Instructions (Addendum)

## 2014-09-11 ENCOUNTER — Telehealth: Payer: Self-pay | Admitting: Physician Assistant

## 2014-09-11 NOTE — Telephone Encounter (Signed)
New Prob    Oxygen recently ordered for pt. However, CMS 484 is missing-This needs to be filled out by [employer]. [facility] will fax again.

## 2014-09-11 NOTE — Telephone Encounter (Signed)
Called Lisa/[facility] back to inform her that the faxed form has been rec'd and given to [employer], [place], to be completed. She verbalized appreciation and will await returned, completed fax.

## 2014-09-12 ENCOUNTER — Ambulatory Visit (INDEPENDENT_AMBULATORY_CARE_PROVIDER_SITE_OTHER): Payer: Medicare Other | Admitting: *Deleted

## 2014-09-12 DIAGNOSIS — I481 Persistent atrial fibrillation: Secondary | ICD-10-CM

## 2014-09-12 DIAGNOSIS — I340 Nonrheumatic mitral (valve) insufficiency: Secondary | ICD-10-CM | POA: Diagnosis not present

## 2014-09-12 DIAGNOSIS — I4819 Other persistent atrial fibrillation: Secondary | ICD-10-CM

## 2014-09-12 DIAGNOSIS — Z5181 Encounter for therapeutic drug level monitoring: Secondary | ICD-10-CM

## 2014-09-12 LAB — POCT INR: INR: 2.7

## 2014-09-13 DIAGNOSIS — Z85820 Personal history of malignant melanoma of skin: Secondary | ICD-10-CM | POA: Diagnosis not present

## 2014-09-13 DIAGNOSIS — C44319 Basal cell carcinoma of skin of other parts of face: Secondary | ICD-10-CM | POA: Diagnosis not present

## 2014-09-13 DIAGNOSIS — L570 Actinic keratosis: Secondary | ICD-10-CM | POA: Diagnosis not present

## 2014-09-13 DIAGNOSIS — C44712 Basal cell carcinoma of skin of right lower limb, including hip: Secondary | ICD-10-CM | POA: Diagnosis not present

## 2014-09-13 DIAGNOSIS — L821 Other seborrheic keratosis: Secondary | ICD-10-CM | POA: Diagnosis not present

## 2014-09-13 DIAGNOSIS — D485 Neoplasm of uncertain behavior of skin: Secondary | ICD-10-CM | POA: Diagnosis not present

## 2014-09-13 DIAGNOSIS — L814 Other melanin hyperpigmentation: Secondary | ICD-10-CM | POA: Diagnosis not present

## 2014-09-19 ENCOUNTER — Ambulatory Visit: Payer: Medicare Other | Admitting: Internal Medicine

## 2014-09-19 ENCOUNTER — Ambulatory Visit (INDEPENDENT_AMBULATORY_CARE_PROVIDER_SITE_OTHER): Payer: Medicare Other | Admitting: *Deleted

## 2014-09-19 DIAGNOSIS — I481 Persistent atrial fibrillation: Secondary | ICD-10-CM

## 2014-09-19 DIAGNOSIS — I4819 Other persistent atrial fibrillation: Secondary | ICD-10-CM

## 2014-09-19 DIAGNOSIS — I340 Nonrheumatic mitral (valve) insufficiency: Secondary | ICD-10-CM | POA: Diagnosis not present

## 2014-09-19 DIAGNOSIS — Z5181 Encounter for therapeutic drug level monitoring: Secondary | ICD-10-CM

## 2014-09-19 LAB — POCT INR: INR: 2.7

## 2014-09-24 NOTE — Telephone Encounter (Signed)
Closed encounter °

## 2014-09-26 ENCOUNTER — Ambulatory Visit (INDEPENDENT_AMBULATORY_CARE_PROVIDER_SITE_OTHER): Payer: Medicare Other | Admitting: *Deleted

## 2014-09-26 DIAGNOSIS — Z5181 Encounter for therapeutic drug level monitoring: Secondary | ICD-10-CM

## 2014-09-26 DIAGNOSIS — I481 Persistent atrial fibrillation: Secondary | ICD-10-CM

## 2014-09-26 DIAGNOSIS — I340 Nonrheumatic mitral (valve) insufficiency: Secondary | ICD-10-CM

## 2014-09-26 DIAGNOSIS — I4819 Other persistent atrial fibrillation: Secondary | ICD-10-CM

## 2014-09-26 LAB — POCT INR: INR: 2.8

## 2014-09-27 DIAGNOSIS — I4892 Unspecified atrial flutter: Secondary | ICD-10-CM | POA: Diagnosis not present

## 2014-09-27 DIAGNOSIS — R21 Rash and other nonspecific skin eruption: Secondary | ICD-10-CM | POA: Diagnosis not present

## 2014-09-27 DIAGNOSIS — I4891 Unspecified atrial fibrillation: Secondary | ICD-10-CM | POA: Diagnosis not present

## 2014-09-30 ENCOUNTER — Encounter: Payer: Self-pay | Admitting: Internal Medicine

## 2014-09-30 ENCOUNTER — Ambulatory Visit (INDEPENDENT_AMBULATORY_CARE_PROVIDER_SITE_OTHER): Payer: Medicare Other | Admitting: Internal Medicine

## 2014-09-30 VITALS — BP 140/72 | HR 64 | Ht 66.5 in | Wt 133.6 lb

## 2014-09-30 DIAGNOSIS — I481 Persistent atrial fibrillation: Secondary | ICD-10-CM | POA: Diagnosis not present

## 2014-09-30 DIAGNOSIS — I4819 Other persistent atrial fibrillation: Secondary | ICD-10-CM

## 2014-09-30 NOTE — Patient Instructions (Signed)
Medication Instructions:  Your physician recommends that you continue on your current medications as directed. Please refer to the Current Medication list given to you today.   Labwork: None ordered  Testing/Procedures: None ordered  Follow-Up: Your physician recommends that you schedule a follow-up appointment in: 3 months with Roderic Palau, NP   Any Other Special Instructions Will Be Listed Below (If Applicable).

## 2014-09-30 NOTE — Progress Notes (Signed)
PCP: [name], MD Primary Cardiologist:  Previously Dr Haroldine Laws  Nancy [name] is a 81 y.o. female who presents today for routine electrophysiology followup.  She seems to be doing well.  Her breathing is "much better" since seeing Dr Melvyn Novas.  She is pleased with her current health state. Today, she denies symptoms of palpitations, chest pain, lower extremity edema, presyncope, or syncope.    The patient is otherwise without complaint today.   Past Medical History  Diagnosis Date  . Migraine   . GERD (gastroesophageal reflux disease)   . Dyslipidemia   . History of scarlet fever   . PAF (paroxysmal atrial fibrillation)   . Depression   . Melanoma   . Mitral regurgitation     Echo (11/15):  EF 50-55%, Gr 2 DD, MAC, prolapsing P2 segment of post MV leaflet with mod to possibly severe MR, mild LAE, normal RVF, PASP 35 mmHg  . Hx of cardiovascular stress test     Lexiscan Myoview (12/15): No scar, no ischemia, EF 59%; Low Risk   Past Surgical History  Procedure Laterality Date  . Tonsillectomy    . Appendectomy    . Cataract extraction      bilateral  . Cesarean section      Current Outpatient Prescriptions  Medication Sig Dispense Refill  . B Complex Vitamins (B COMPLEX 100 PO) Take 1 tablet by mouth daily.    . calcium-vitamin D (OSCAL WITH D 500-200) 500-200 MG-UNIT per tablet Take 1 tablet by mouth daily.      . Coenzyme Q10 (CO Q 10) 100 MG CAPS Take 1 capsule by mouth daily.     . Multiple Vitamins-Minerals (CENTRUM SILVER PO) Take 1 tablet by mouth daily.    . vitamin B-12 (CYANOCOBALAMIN) 100 MCG tablet Take 100 mcg by mouth daily.    Alveda Reasons 15 MG TABS tablet TAKE 1 TABLET (15 MG TOTAL) BY MOUTH DAILY. 30 tablet 5  . furosemide (LASIX) 20 MG tablet Take 1 tablet (20 mg total) by mouth 2 (two) times a week. (Patient not taking: Reported on 06/12/2014) 30 tablet 3   No current facility-administered medications for this visit.    Physical Exam:                    Filed Vitals:   09/30/14 1637  BP: 140/72  Pulse: 64    GEN- The patient is well appearing, alert and oriented x 3 today.   Head- normocephalic, atraumatic Eyes-  Sclera clear, conjunctiva pink Ears- hearing intact Oropharynx- clear Lungs- Clear to ausculation bilaterally, normal work of breathing Heart- RRR, 26 SEM LLSB GI- soft, NT, ND, + BS Extremities- no clubbing, cyanosis, or edema  ekg today reveals sinus rhythm  Assessment and Plan:  1. Persistent afib In sinus today Continue coumadin for stroke prevention  F/u Afib clinic every 3 months.  I will see when needed

## 2014-10-04 ENCOUNTER — Ambulatory Visit (INDEPENDENT_AMBULATORY_CARE_PROVIDER_SITE_OTHER): Payer: Medicare Other | Admitting: *Deleted

## 2014-10-04 DIAGNOSIS — Z5181 Encounter for therapeutic drug level monitoring: Secondary | ICD-10-CM | POA: Diagnosis not present

## 2014-10-04 DIAGNOSIS — I4819 Other persistent atrial fibrillation: Secondary | ICD-10-CM

## 2014-10-04 DIAGNOSIS — I481 Persistent atrial fibrillation: Secondary | ICD-10-CM

## 2014-10-04 DIAGNOSIS — I340 Nonrheumatic mitral (valve) insufficiency: Secondary | ICD-10-CM

## 2014-10-04 LAB — POCT INR: INR: 2.5

## 2014-10-09 DIAGNOSIS — C44712 Basal cell carcinoma of skin of right lower limb, including hip: Secondary | ICD-10-CM | POA: Diagnosis not present

## 2014-10-09 DIAGNOSIS — L570 Actinic keratosis: Secondary | ICD-10-CM | POA: Diagnosis not present

## 2014-10-09 DIAGNOSIS — Z85828 Personal history of other malignant neoplasm of skin: Secondary | ICD-10-CM | POA: Diagnosis not present

## 2014-10-09 DIAGNOSIS — C44319 Basal cell carcinoma of skin of other parts of face: Secondary | ICD-10-CM | POA: Diagnosis not present

## 2014-10-28 ENCOUNTER — Other Ambulatory Visit: Payer: Self-pay | Admitting: *Deleted

## 2014-10-28 MED ORDER — WARFARIN SODIUM 5 MG PO TABS: ORAL_TABLET | ORAL | Status: DC

## 2014-10-29 ENCOUNTER — Ambulatory Visit (INDEPENDENT_AMBULATORY_CARE_PROVIDER_SITE_OTHER): Payer: Medicare Other | Admitting: Internal Medicine

## 2014-10-29 DIAGNOSIS — Z79899 Other long term (current) drug therapy: Secondary | ICD-10-CM | POA: Diagnosis not present

## 2014-10-29 LAB — PULMONARY FUNCTION TEST
DL/VA % pred: 92 %
DL/VA: 4.61 ml/min/mmHg/L
DLCO unc % pred: 62 %
DLCO unc: 16.55 ml/min/mmHg
FEF 25-75 Post: 0.87 L/sec
FEF 25-75 Pre: 0.65 L/sec
FEF2575-%Change-Post: 33 %
FEF2575-%PRED-POST: 75 %
FEF2575-%Pred-Pre: 56 %
FEV1-%CHANGE-POST: 10 %
FEV1-%PRED-POST: 64 %
FEV1-%Pred-Pre: 57 %
FEV1-Post: 1.19 L
FEV1-Pre: 1.07 L
FEV1FVC-%CHANGE-POST: 4 %
FEV1FVC-%Pred-Pre: 94 %
FEV6-%Change-Post: 7 %
FEV6-%PRED-POST: 70 %
FEV6-%Pred-Pre: 65 %
FEV6-Post: 1.66 L
FEV6-Pre: 1.54 L
FEV6FVC-%CHANGE-POST: 0 %
FEV6FVC-%PRED-POST: 105 %
FEV6FVC-%Pred-Pre: 106 %
FVC-%Change-Post: 6 %
FVC-%PRED-PRE: 63 %
FVC-%Pred-Post: 67 %
FVC-POST: 1.68 L
FVC-Pre: 1.59 L
POST FEV6/FVC RATIO: 100 %
PRE FEV6/FVC RATIO: 100 %
Post FEV1/FVC ratio: 71 %
Pre FEV1/FVC ratio: 68 %

## 2014-10-29 NOTE — Progress Notes (Signed)
PFT done today. 

## 2014-11-14 DIAGNOSIS — L239 Allergic contact dermatitis, unspecified cause: Secondary | ICD-10-CM | POA: Diagnosis not present

## 2014-11-14 DIAGNOSIS — L299 Pruritus, unspecified: Secondary | ICD-10-CM | POA: Diagnosis not present

## 2014-11-20 DIAGNOSIS — H26492 Other secondary cataract, left eye: Secondary | ICD-10-CM | POA: Diagnosis not present

## 2014-11-20 DIAGNOSIS — Z961 Presence of intraocular lens: Secondary | ICD-10-CM | POA: Diagnosis not present

## 2014-11-21 ENCOUNTER — Telehealth: Payer: Self-pay | Admitting: *Deleted

## 2014-11-21 NOTE — Telephone Encounter (Signed)
S/w pt about her PFT results reviewed by Brynda Rim. PA and Dr. Rayann Heman who states Amiodarone not causing lung problemes. I advised pt that I did not see that Dr. Rayann Heman address the the PA's question if we were going to re-start Amiodarone. I will send message to Dr. Jackalyn Lombard nurse Claiborne Billings to find out if we are going to re-start Amiodarone. Pt does have appt Roderic Palau 01/01/15. I advised pt if we are going to re-start Amiodarone we will cb and let her know, pt said if so then Rx will need to be sent in.

## 2014-12-16 DIAGNOSIS — I480 Paroxysmal atrial fibrillation: Secondary | ICD-10-CM | POA: Diagnosis not present

## 2014-12-16 DIAGNOSIS — Z6822 Body mass index (BMI) 22.0-22.9, adult: Secondary | ICD-10-CM | POA: Diagnosis not present

## 2014-12-16 DIAGNOSIS — Z23 Encounter for immunization: Secondary | ICD-10-CM | POA: Diagnosis not present

## 2014-12-16 DIAGNOSIS — S0080XA Unspecified superficial injury of other part of head, initial encounter: Secondary | ICD-10-CM | POA: Diagnosis not present

## 2014-12-16 DIAGNOSIS — Z7901 Long term (current) use of anticoagulants: Secondary | ICD-10-CM | POA: Diagnosis not present

## 2014-12-18 ENCOUNTER — Ambulatory Visit (INDEPENDENT_AMBULATORY_CARE_PROVIDER_SITE_OTHER): Payer: Medicare Other | Admitting: Family Medicine

## 2014-12-18 VITALS — BP 120/80 | HR 98 | Temp 98.6°F | Resp 16 | Ht 65.25 in | Wt 133.8 lb

## 2014-12-18 DIAGNOSIS — L259 Unspecified contact dermatitis, unspecified cause: Secondary | ICD-10-CM

## 2014-12-18 MED ORDER — TRIAMCINOLONE ACETONIDE 0.1 % EX CREA: 1.0000 "application " | TOPICAL_CREAM | Freq: Two times a day (BID) | CUTANEOUS | Status: DC

## 2014-12-18 NOTE — Progress Notes (Signed)
Patient ID: Nancy [name], female   DOB: [birth year], 81 y.o.   MRN: [id number] ARVA SLAUGH is a 81 y.o. who presents today for prurtic legs.  Pruritis, lower legs - Ongoing now for about 3 days and does admit to yardwork.  She does not think she was bite by anything and denies any other exposures.  No fevers, chills, or sweats or other systemic Sx.  Denies draining lesions, LE edema.  Has tried some cream for itching which has helped somewhat.  Does not spread anywhere else.    Past Medical History  Diagnosis Date  . Migraine   . GERD (gastroesophageal reflux disease)   . Dyslipidemia   . History of scarlet fever   . PAF (paroxysmal atrial fibrillation)   . Depression   . Melanoma   . Mitral regurgitation     a. Echo (11/15):  EF 50-55%, Gr 2 DD, MAC, prolapsing P2 segment of post MV leaflet with mod to possibly severe MR, mild LAE, normal RVF, PASP 35 mmHg;  b. Echo 5/16: Mild LVH, EF 55-60%, trivial AI, moderate MR (LVID, ES 32.3 mm), mild BAE, moderate TR, PASP 48  . Hx of cardiovascular stress test     Lexiscan Myoview (12/15): No scar, no ischemia, EF 59%; Low Risk    Social History   Social History  . Marital Status: Married    Spouse Name: N/A  . Number of Children: N/A  . Years of Education: N/A   Occupational History  . Not on file.   Social History Main Topics  . Smoking status: Former Smoker -- 6 years    Types: Cigarettes    Quit date: 09/09/1967  . Smokeless tobacco: Not on file     Comment: pt reports she smoked socially  . Alcohol Use: 0.6 oz/week    1 Glasses of wine per week  . Drug Use: No  . Sexual Activity: Not on file   Other Topics Concern  . Not on file   Social History Narrative    Family History  Problem Relation Age of Onset  . Colon polyps    . Heart attack Neg Hx   . Stroke Neg Hx   . Hypertension Neg Hx     Current Outpatient Prescriptions on File Prior to Visit  Medication Sig Dispense Refill  . B Complex Vitamins (B COMPLEX 100  PO) Take 1 tablet by mouth daily.    . calcium-vitamin D (OSCAL WITH D 500-200) 500-200 MG-UNIT per tablet Take 1 tablet by mouth daily.      . Coenzyme Q10 (CO Q 10 PO) Take 1 capsule by mouth daily.    . famotidine (PEPCID) 20 MG tablet Take 20 mg by mouth at bedtime.    . furosemide (LASIX) 20 MG tablet Take 1 tablet (20 mg total) by mouth daily. 30 tablet 3  . Multiple Vitamins-Minerals (CENTRUM SILVER PO) Take 1 tablet by mouth daily.    . pantoprazole (PROTONIX) 40 MG tablet Take 1 tablet (40 mg total) by mouth daily. Take 30-60 min before first meal of the day 30 tablet 11  . vitamin B-12 (CYANOCOBALAMIN) 100 MCG tablet Take 100 mcg by mouth daily.    Marland Kitchen warfarin (COUMADIN) 5 MG tablet Take as directed by the Coumadin Clinic 50 tablet 1   No current facility-administered medications on file prior to visit.   Review of Symptoms  12 point negative other than HPI  Physical Exam Filed Vitals:   12/18/14 1726  BP:  120/80  Pulse: 98  Temp: 98.6 F (37 C)  Resp: 16    Gen: NAD, Well nourished, Well developed HEENT: PERLA, EOMI, [place]/AT Neck: no JVD Cardio: RRR, No murmurs/gallops/rubs Lungs: CTA, no wheezes, rhonchi, crackles Abd: NABS, soft nontender nondistended MSK: ROM normal  Neuro: MS 5/5 B/L UE and LE, +2 patellar and achilles relfex b/l  Psych: AAO x 3 Skin: LE vesicles along with erythema and excoriations.  No papules or macules present      Chemistry      Component Value Date/Time   NA 137 08/09/2014 1328   K 4.4 08/09/2014 1328   CL 101 08/09/2014 1328   CO2 32 08/09/2014 1328   BUN 19 08/09/2014 1328   CREATININE 1.36* 08/09/2014 1328      Component Value Date/Time   CALCIUM 9.4 08/09/2014 1328   ALKPHOS 66 02/12/2014 1011   AST 28 02/12/2014 1011   ALT 19 02/12/2014 1011   BILITOT 0.6 02/12/2014 1011      Lab Results  Component Value Date   WBC 7.7 02/11/2014   HGB 14.4 02/11/2014   HCT 43.7 02/11/2014   MCV 94.4 02/11/2014   PLT 365 02/11/2014    Lab Results  Component Value Date   TSH 1.30 02/12/2014   No results found for: HGBA1C

## 2014-12-18 NOTE — Patient Instructions (Signed)

## 2014-12-18 NOTE — Assessment & Plan Note (Signed)
Pt has vesicles on LLE c/w poison ivy/contact dermatitis -She does not want systemic steroid or antihistamine - Topical kenalog to area.  - F/U PRN.

## 2014-12-20 NOTE — Progress Notes (Signed)
Patient discussed with Dr. Hess. Agree with assessment and plan of care per his note.   

## 2014-12-23 DIAGNOSIS — I480 Paroxysmal atrial fibrillation: Secondary | ICD-10-CM | POA: Diagnosis not present

## 2014-12-23 DIAGNOSIS — Z7901 Long term (current) use of anticoagulants: Secondary | ICD-10-CM | POA: Diagnosis not present

## 2014-12-31 ENCOUNTER — Other Ambulatory Visit: Payer: Self-pay | Admitting: Internal Medicine

## 2014-12-31 DIAGNOSIS — R911 Solitary pulmonary nodule: Secondary | ICD-10-CM

## 2015-01-01 ENCOUNTER — Encounter (HOSPITAL_COMMUNITY): Payer: Self-pay | Admitting: Nurse Practitioner

## 2015-01-01 ENCOUNTER — Ambulatory Visit (HOSPITAL_COMMUNITY)
Admission: RE | Admit: 2015-01-01 | Discharge: 2015-01-01 | Disposition: A | Discharge status: Home / Self Care | Payer: Medicare Other | Source: Ambulatory Visit | Attending: Nurse Practitioner | Admitting: Nurse Practitioner

## 2015-01-01 VITALS — BP 110/62 | HR 103 | Ht 66.0 in | Wt 131.6 lb

## 2015-01-01 DIAGNOSIS — I480 Paroxysmal atrial fibrillation: Secondary | ICD-10-CM | POA: Diagnosis not present

## 2015-01-01 NOTE — Progress Notes (Signed)
Patient ID: Nancy [name], female   DOB: 1925/05/12, 81 y.o.   MRN: [id number]     Primary Care Physician: [name], MD Referring Physician: Dr. Ardean Larsen is a 81 y.o. female with a h/o afib that is here for f/u. She is in SR today, she is not ware of any issues with irregular heart beat. She has no complaints. Continues on warfarin without bleeding issues. She is very active for her age and takes several exercise classes a week.   Today, she denies symptoms of palpitations, chest pain, shortness of breath, orthopnea, PND, lower extremity edema, dizziness, presyncope, syncope, or neurologic sequela. The patient is tolerating medications without difficulties and is otherwise without complaint today.   Past Medical History  Diagnosis Date  . Migraine   . GERD (gastroesophageal reflux disease)   . Dyslipidemia   . History of scarlet fever   . PAF (paroxysmal atrial fibrillation)   . Depression   . Melanoma   . Mitral regurgitation     a. Echo (11/15):  EF 50-55%, Gr 2 DD, MAC, prolapsing P2 segment of post MV leaflet with mod to possibly severe MR, mild LAE, normal RVF, PASP 35 mmHg;  b. Echo 5/16: Mild LVH, EF 55-60%, trivial AI, moderate MR (LVID, ES 32.3 mm), mild BAE, moderate TR, PASP 48  . Hx of cardiovascular stress test     Lexiscan Myoview (12/15): No scar, no ischemia, EF 59%; Low Risk   Past Surgical History  Procedure Laterality Date  . Tonsillectomy    . Appendectomy    . Cataract extraction      bilateral  . Cesarean section      Current Outpatient Prescriptions  Medication Sig Dispense Refill  . B Complex Vitamins (B COMPLEX 100 PO) Take 1 tablet by mouth daily.    . calcium-vitamin D (OSCAL WITH D 500-200) 500-200 MG-UNIT per tablet Take 1 tablet by mouth daily.      . Coenzyme Q10 (CO Q 10 PO) Take 1 capsule by mouth daily.    . famotidine (PEPCID) 20 MG tablet Take 20 mg by mouth at bedtime.    . furosemide (LASIX) 20 MG tablet Take 1 tablet  (20 mg total) by mouth daily. 30 tablet 3  . Multiple Vitamins-Minerals (CENTRUM SILVER PO) Take 1 tablet by mouth daily.    . pantoprazole (PROTONIX) 40 MG tablet Take 1 tablet (40 mg total) by mouth daily. Take 30-60 min before first meal of the day 30 tablet 11  . triamcinolone cream (KENALOG) 0.1 % Apply 1 application topically 2 (two) times daily. (Patient not taking: Reported on 01/01/2015) 80 g 1  . vitamin B-12 (CYANOCOBALAMIN) 100 MCG tablet Take 100 mcg by mouth daily.    Marland Kitchen warfarin (COUMADIN) 5 MG tablet Take as directed by the Coumadin Clinic 50 tablet 1   No current facility-administered medications for this encounter.    No Known Allergies  Social History   Social History  . Marital Status: Married    Spouse Name: N/A  . Number of Children: N/A  . Years of Education: N/A   Occupational History  . Not on file.   Social History Main Topics  . Smoking status: Former Smoker -- 6 years    Types: Cigarettes    Quit date: 09/09/1967  . Smokeless tobacco: Not on file     Comment: pt reports she smoked socially  . Alcohol Use: 0.6 oz/week    1 Glasses of wine per week  .  Drug Use: No  . Sexual Activity: Not on file   Other Topics Concern  . Not on file   Social History Narrative    Family History  Problem Relation Age of Onset  . Colon polyps    . Heart attack Neg Hx   . Stroke Neg Hx   . Hypertension Neg Hx     ROS- All systems are reviewed and negative except as per the HPI above  Physical Exam: Filed Vitals:   01/01/15 1443  BP: 110/62  Pulse: 103  Height: 5\' 6"  (1.676 m)  Weight: 131 lb 9.6 oz (59.693 kg)    GEN- The patient is well appearing, alert and oriented x 3 today.   Head- normocephalic, atraumatic Eyes-  Sclera clear, conjunctiva pink Ears- hearing intact Oropharynx- clear Neck- supple, no JVP Lymph- no cervical lymphadenopathy Lungs- Clear to ausculation bilaterally, normal work of breathing Heart- Regular rate and rhythm, no  murmurs, rubs or gallops, PMI not laterally displaced GI- soft, NT, ND, + BS Extremities- no clubbing, cyanosis, or edema MS- no significant deformity or atrophy Skin- no rash or lesion Psych- euthymic mood, full affect Neuro- strength and sensation are intact  EKG- SR with pvc's, PR itn 122 ms, QRS int 80 ms, QTc 464 ms. Epic records reviewed  Assessment and Plan: 1. PAF In SR Feels well, not aware of any irregular heart beat Continue diltiazem, warfarin  F/u in 3 months  Butch Penny C. Carroll, [facility] [address] [place], [place] [address] [phone]

## 2015-01-06 ENCOUNTER — Ambulatory Visit
Admission: RE | Admit: 2015-01-06 | Discharge: 2015-01-06 | Disposition: A | Discharge status: Home / Self Care | Payer: Medicare Other | Source: Ambulatory Visit | Attending: Internal Medicine | Admitting: Internal Medicine

## 2015-01-06 DIAGNOSIS — R911 Solitary pulmonary nodule: Secondary | ICD-10-CM

## 2015-01-06 DIAGNOSIS — R918 Other nonspecific abnormal finding of lung field: Secondary | ICD-10-CM | POA: Diagnosis not present

## 2015-01-28 ENCOUNTER — Other Ambulatory Visit: Payer: Self-pay | Admitting: Physician Assistant

## 2015-01-29 NOTE — Telephone Encounter (Signed)
Ok to refill under Dr Rayann Heman? Previously only ordered under [employer]

## 2015-01-29 NOTE — Telephone Encounter (Signed)
Fill under Nancy [name]

## 2015-01-31 DIAGNOSIS — N183 Chronic kidney disease, stage 3 (moderate): Secondary | ICD-10-CM | POA: Diagnosis not present

## 2015-01-31 DIAGNOSIS — I480 Paroxysmal atrial fibrillation: Secondary | ICD-10-CM | POA: Diagnosis not present

## 2015-01-31 DIAGNOSIS — Z6822 Body mass index (BMI) 22.0-22.9, adult: Secondary | ICD-10-CM | POA: Diagnosis not present

## 2015-01-31 DIAGNOSIS — R159 Full incontinence of feces: Secondary | ICD-10-CM | POA: Diagnosis not present

## 2015-01-31 DIAGNOSIS — R413 Other amnesia: Secondary | ICD-10-CM | POA: Diagnosis not present

## 2015-01-31 DIAGNOSIS — J984 Other disorders of lung: Secondary | ICD-10-CM | POA: Diagnosis not present

## 2015-02-12 DIAGNOSIS — L603 Nail dystrophy: Secondary | ICD-10-CM | POA: Diagnosis not present

## 2015-02-12 DIAGNOSIS — Z85828 Personal history of other malignant neoplasm of skin: Secondary | ICD-10-CM | POA: Diagnosis not present

## 2015-03-03 DIAGNOSIS — I480 Paroxysmal atrial fibrillation: Secondary | ICD-10-CM | POA: Diagnosis not present

## 2015-03-03 DIAGNOSIS — Z7901 Long term (current) use of anticoagulants: Secondary | ICD-10-CM | POA: Diagnosis not present

## 2015-04-11 DIAGNOSIS — I480 Paroxysmal atrial fibrillation: Secondary | ICD-10-CM | POA: Diagnosis not present

## 2015-04-11 DIAGNOSIS — Z7901 Long term (current) use of anticoagulants: Secondary | ICD-10-CM | POA: Diagnosis not present

## 2015-04-14 ENCOUNTER — Inpatient Hospital Stay (HOSPITAL_COMMUNITY): Admission: RE | Admit: 2015-04-14 | Payer: Self-pay | Source: Ambulatory Visit | Admitting: Nurse Practitioner

## 2015-04-18 DIAGNOSIS — L720 Epidermal cyst: Secondary | ICD-10-CM | POA: Diagnosis not present

## 2015-04-18 DIAGNOSIS — Z85828 Personal history of other malignant neoplasm of skin: Secondary | ICD-10-CM | POA: Diagnosis not present

## 2015-04-18 DIAGNOSIS — L821 Other seborrheic keratosis: Secondary | ICD-10-CM | POA: Diagnosis not present

## 2015-04-18 DIAGNOSIS — Z85820 Personal history of malignant melanoma of skin: Secondary | ICD-10-CM | POA: Diagnosis not present

## 2015-04-18 DIAGNOSIS — L814 Other melanin hyperpigmentation: Secondary | ICD-10-CM | POA: Diagnosis not present

## 2015-04-18 DIAGNOSIS — D485 Neoplasm of uncertain behavior of skin: Secondary | ICD-10-CM | POA: Diagnosis not present

## 2015-04-18 DIAGNOSIS — D1801 Hemangioma of skin and subcutaneous tissue: Secondary | ICD-10-CM | POA: Diagnosis not present

## 2015-04-18 DIAGNOSIS — L570 Actinic keratosis: Secondary | ICD-10-CM | POA: Diagnosis not present

## 2015-04-21 ENCOUNTER — Encounter (HOSPITAL_COMMUNITY): Payer: Self-pay | Admitting: Nurse Practitioner

## 2015-04-21 ENCOUNTER — Ambulatory Visit (HOSPITAL_COMMUNITY)
Admission: RE | Admit: 2015-04-21 | Discharge: 2015-04-21 | Disposition: A | Discharge status: Home / Self Care | Payer: Medicare Other | Source: Ambulatory Visit | Attending: Nurse Practitioner | Admitting: Nurse Practitioner

## 2015-04-21 VITALS — BP 116/62 | HR 125 | Ht 66.0 in | Wt 133.8 lb

## 2015-04-21 DIAGNOSIS — F329 Major depressive disorder, single episode, unspecified: Secondary | ICD-10-CM | POA: Insufficient documentation

## 2015-04-21 DIAGNOSIS — E785 Hyperlipidemia, unspecified: Secondary | ICD-10-CM | POA: Insufficient documentation

## 2015-04-21 DIAGNOSIS — I340 Nonrheumatic mitral (valve) insufficiency: Secondary | ICD-10-CM | POA: Diagnosis not present

## 2015-04-21 DIAGNOSIS — K219 Gastro-esophageal reflux disease without esophagitis: Secondary | ICD-10-CM | POA: Insufficient documentation

## 2015-04-21 DIAGNOSIS — C439 Malignant melanoma of skin, unspecified: Secondary | ICD-10-CM | POA: Insufficient documentation

## 2015-04-21 DIAGNOSIS — Z7901 Long term (current) use of anticoagulants: Secondary | ICD-10-CM | POA: Diagnosis not present

## 2015-04-21 DIAGNOSIS — I480 Paroxysmal atrial fibrillation: Secondary | ICD-10-CM | POA: Diagnosis not present

## 2015-04-21 DIAGNOSIS — I4891 Unspecified atrial fibrillation: Secondary | ICD-10-CM | POA: Diagnosis present

## 2015-04-21 NOTE — Progress Notes (Signed)
Patient ID: Nancy [name], female   DOB: 1925-05-02, 82 y.o.   MRN: [id number]     Primary Care Physician: Gwen Pounds, MD Referring Physician: Dr. Dellie [name] is a 82 y.o. female with a h/o afib that is here for f/u. She is in afib today, she is not ware of arrythmia . She has no complaints. Exercised this am without any undue fatigue or shortness of breath. Continues on warfarin without bleeding issues. She is very active for her age . She is not on  meds for rate control but continues with warfarin.  Today, she denies symptoms of palpitations, chest pain, shortness of breath, orthopnea, PND, lower extremity edema, dizziness, presyncope, syncope, or neurologic sequela. The patient is tolerating medications without difficulties and is otherwise without complaint today.   Past Medical History  Diagnosis Date  . Migraine   . GERD (gastroesophageal reflux disease)   . Dyslipidemia   . History of scarlet fever   . PAF (paroxysmal atrial fibrillation) (HCC)   . Depression   . Melanoma (HCC)   . Mitral regurgitation     a. Echo (11/15):  EF 50-55%, Gr 2 DD, MAC, prolapsing P2 segment of post MV leaflet with mod to possibly severe MR, mild LAE, normal RVF, PASP 35 mmHg;  b. Echo 5/16: Mild LVH, EF 55-60%, trivial AI, moderate MR (LVID, ES 32.3 mm), mild BAE, moderate TR, PASP 48  . Hx of cardiovascular stress test     Lexiscan Myoview (12/15): No scar, no ischemia, EF 59%; Low Risk   Past Surgical History  Procedure Laterality Date  . Tonsillectomy    . Appendectomy    . Cataract extraction      bilateral  . Cesarean section      Current Outpatient Prescriptions  Medication Sig Dispense Refill  . B Complex Vitamins (B COMPLEX 100 PO) Take 1 tablet by mouth daily.    . calcium-vitamin D (OSCAL WITH D 500-200) 500-200 MG-UNIT per tablet Take 1 tablet by mouth daily.      . Coenzyme Q10 (CO Q 10 PO) Take 1 capsule by mouth daily.    . famotidine (PEPCID) 20 MG tablet Take  20 mg by mouth at bedtime.    . furosemide (LASIX) 20 MG tablet TAKE 1 TABLET (20 MG TOTAL) BY MOUTH DAILY. 30 tablet 5  . Multiple Vitamins-Minerals (CENTRUM SILVER PO) Take 1 tablet by mouth daily.    . pantoprazole (PROTONIX) 40 MG tablet Take 1 tablet (40 mg total) by mouth daily. Take 30-60 min before first meal of the day 30 tablet 11  . triamcinolone cream (KENALOG) 0.1 % Apply 1 application topically 2 (two) times daily. 80 g 1  . vitamin B-12 (CYANOCOBALAMIN) 100 MCG tablet Take 100 mcg by mouth daily.    Marland Kitchen warfarin (COUMADIN) 5 MG tablet Take as directed by the [facility] 50 tablet 1   No current facility-administered medications for this encounter.    No Known Allergies  Social History   Social History  . Marital Status: Married    Spouse Name: N/A  . Number of Children: N/A  . Years of Education: N/A   Occupational History  . Not on file.   Social History Main Topics  . Smoking status: Former Smoker -- 6 years    Types: Cigarettes    Quit date: 09/09/1967  . Smokeless tobacco: Not on file     Comment: pt reports she smoked socially  . Alcohol Use: 0.6 oz/week  1 Glasses of wine per week  . Drug Use: No  . Sexual Activity: Not on file   Other Topics Concern  . Not on file   Social History Narrative    Family History  Problem Relation Age of Onset  . Colon polyps    . Heart attack Neg Hx   . Stroke Neg Hx   . Hypertension Neg Hx     ROS- All systems are reviewed and negative except as per the HPI above  Physical Exam: Filed Vitals:   04/21/15 1519  BP: 116/62  Pulse: 125  Height: 5\' 6"  (1.676 m)  Weight: 133 lb 12.8 oz (60.691 kg)    GEN- The patient is well appearing, alert and oriented x 3 today.   Head- normocephalic, atraumatic Eyes-  Sclera clear, conjunctiva pink Ears- hearing intact Oropharynx- clear Neck- supple, no JVP Lymph- no cervical lymphadenopathy Lungs- Clear to ausculation bilaterally, normal work of  breathing Heart- Irregular rate and rhythm, no murmurs, rubs or gallops, PMI not laterally displaced GI- soft, NT, ND, + BS Extremities- no clubbing, cyanosis, or edema MS- no significant deformity or atrophy Skin- no rash or lesion Psych- euthymic mood, full affect Neuro- strength and sensation are intact  EKG- Afib with rvr at 125 bpm, , QRS int 80 ms, QTc 502 ms. Epic records reviewed  Assessment and Plan: 1. PAF In asymptomatic afib today Feels well, not aware of any irregular heart beat Continue warfarin No rate control meds on board  F/u on Wednesday, if continues in afib will need rate control drugs  [name] [address] [place], [place] [address] [phone]

## 2015-04-23 ENCOUNTER — Ambulatory Visit (HOSPITAL_COMMUNITY)
Admission: RE | Admit: 2015-04-23 | Discharge: 2015-04-23 | Disposition: A | Discharge status: Home / Self Care | Payer: Medicare Other | Source: Ambulatory Visit | Attending: Nurse Practitioner | Admitting: Nurse Practitioner

## 2015-04-23 VITALS — BP 122/74 | HR 117

## 2015-04-23 DIAGNOSIS — I481 Persistent atrial fibrillation: Secondary | ICD-10-CM | POA: Diagnosis not present

## 2015-04-23 DIAGNOSIS — I480 Paroxysmal atrial fibrillation: Secondary | ICD-10-CM | POA: Insufficient documentation

## 2015-04-23 DIAGNOSIS — I4819 Other persistent atrial fibrillation: Secondary | ICD-10-CM

## 2015-04-23 MED ORDER — METOPROLOL TARTRATE 25 MG PO TABS: 12.5000 mg | ORAL_TABLET | Freq: Two times a day (BID) | ORAL | Status: DC

## 2015-04-23 NOTE — Patient Instructions (Signed)
Your physician has recommended you make the following change in your medication:  1)Metoprolol (lopressor) 12.5mg  (1/2 tablet) twice a day

## 2015-04-23 NOTE — Progress Notes (Signed)
Patient ID: Nancy [name], female   DOB: 08-16-1925, 82 y.o.   MRN: [id number]     Primary Care Physician: Gwen Pounds, MD Referring Physician: Dr. Dellie [name] is a 82 y.o. female with a h/o afib that is here for f/u from last week when pt was found in afib with rvr and pt being asymptomatic. She was brought back today to see if she continued in afib.She continues in afib with v rate of 117 bpm. She still is asymptomatic. She continues on warfarin.  Today, she denies symptoms of palpitations, chest pain, shortness of breath, orthopnea, PND, lower extremity edema, dizziness, presyncope, syncope, or neurologic sequela. The patient is tolerating medications without difficulties and is otherwise without complaint today.   Past Medical History  Diagnosis Date  . Migraine   . GERD (gastroesophageal reflux disease)   . Dyslipidemia   . History of scarlet fever   . PAF (paroxysmal atrial fibrillation) (HCC)   . Depression   . Melanoma (HCC)   . Mitral regurgitation     a. Echo (11/15):  EF 50-55%, Gr 2 DD, MAC, prolapsing P2 segment of post MV leaflet with mod to possibly severe MR, mild LAE, normal RVF, PASP 35 mmHg;  b. Echo 5/16: Mild LVH, EF 55-60%, trivial AI, moderate MR (LVID, ES 32.3 mm), mild BAE, moderate TR, PASP 48  . Hx of cardiovascular stress test     Lexiscan Myoview (12/15): No scar, no ischemia, EF 59%; Low Risk   Past Surgical History  Procedure Laterality Date  . Tonsillectomy    . Appendectomy    . Cataract extraction      bilateral  . Cesarean section      Current Outpatient Prescriptions  Medication Sig Dispense Refill  . B Complex Vitamins (B COMPLEX 100 PO) Take 1 tablet by mouth daily.    . calcium-vitamin D (OSCAL WITH D 500-200) 500-200 MG-UNIT per tablet Take 1 tablet by mouth daily.      . Coenzyme Q10 (CO Q 10 PO) Take 1 capsule by mouth daily.    . famotidine (PEPCID) 20 MG tablet Take 20 mg by mouth at bedtime.    . furosemide (LASIX) 20  MG tablet TAKE 1 TABLET (20 MG TOTAL) BY MOUTH DAILY. 30 tablet 5  . metoprolol tartrate (LOPRESSOR) 25 MG tablet Take 0.5 tablets (12.5 mg total) by mouth 2 (two) times daily. 30 tablet 1  . Multiple Vitamins-Minerals (CENTRUM SILVER PO) Take 1 tablet by mouth daily.    . pantoprazole (PROTONIX) 40 MG tablet Take 1 tablet (40 mg total) by mouth daily. Take 30-60 min before first meal of the day 30 tablet 11  . triamcinolone cream (KENALOG) 0.1 % Apply 1 application topically 2 (two) times daily. 80 g 1  . vitamin B-12 (CYANOCOBALAMIN) 100 MCG tablet Take 100 mcg by mouth daily.    Marland Kitchen warfarin (COUMADIN) 5 MG tablet Take as directed by the [facility] 50 tablet 1   No current facility-administered medications for this encounter.    No Known Allergies  Social History   Social History  . Marital Status: Married    Spouse Name: N/A  . Number of Children: N/A  . Years of Education: N/A   Occupational History  . Not on file.   Social History Main Topics  . Smoking status: Former Smoker -- 6 years    Types: Cigarettes    Quit date: 09/09/1967  . Smokeless tobacco: Not on file  Comment: pt reports she smoked socially  . Alcohol Use: 0.6 oz/week    1 Glasses of wine per week  . Drug Use: No  . Sexual Activity: Not on file   Other Topics Concern  . Not on file   Social History Narrative    Family History  Problem Relation Age of Onset  . Colon polyps    . Heart attack Neg Hx   . Stroke Neg Hx   . Hypertension Neg Hx     ROS- All systems are reviewed and negative except as per the HPI above  Physical Exam: Filed Vitals:   04/23/15 1557  BP: 122/74  Pulse: 117    GEN- The patient is well appearing, alert and oriented x 3 today.   Head- normocephalic, atraumatic Eyes-  Sclera clear, conjunctiva pink Ears- hearing intact Oropharynx- clear Neck- supple, no JVP Lymph- no cervical lymphadenopathy Lungs- Clear to ausculation bilaterally, normal work of  breathing Heart- Irregular rate and rhythm, no murmurs, rubs or gallops, PMI not laterally displaced GI- soft, NT, ND, + BS Extremities- no clubbing, cyanosis, or edema MS- no significant deformity or atrophy Skin- no rash or lesion Psych- euthymic mood, full affect Neuro- strength and sensation are intact  EKG- afib with rvr at 117 bpm, qrs int 80 ms, qtc 485 ms. Epic records reviewed  Assessment and Plan: 1. PAF In  Afib, asymptomatic, plan for rate control since not aware of afib  Start metoprolol 25 mg 1/2 tab bid Continue warfarin  F/u afib clinc in 1 week  [name] [address] [place], [place] [address] [phone]

## 2015-04-30 ENCOUNTER — Ambulatory Visit (HOSPITAL_COMMUNITY)
Admission: RE | Admit: 2015-04-30 | Discharge: 2015-04-30 | Disposition: A | Discharge status: Home / Self Care | Payer: Medicare Other | Source: Ambulatory Visit | Attending: Nurse Practitioner | Admitting: Nurse Practitioner

## 2015-04-30 ENCOUNTER — Encounter (HOSPITAL_COMMUNITY): Payer: Self-pay | Admitting: Nurse Practitioner

## 2015-04-30 VITALS — BP 138/82 | HR 88 | Ht 66.0 in | Wt 134.6 lb

## 2015-04-30 DIAGNOSIS — K219 Gastro-esophageal reflux disease without esophagitis: Secondary | ICD-10-CM | POA: Diagnosis not present

## 2015-04-30 DIAGNOSIS — I481 Persistent atrial fibrillation: Secondary | ICD-10-CM

## 2015-04-30 DIAGNOSIS — I480 Paroxysmal atrial fibrillation: Secondary | ICD-10-CM | POA: Diagnosis not present

## 2015-04-30 DIAGNOSIS — Z87891 Personal history of nicotine dependence: Secondary | ICD-10-CM | POA: Diagnosis not present

## 2015-04-30 DIAGNOSIS — Z79899 Other long term (current) drug therapy: Secondary | ICD-10-CM | POA: Diagnosis not present

## 2015-04-30 DIAGNOSIS — Z7901 Long term (current) use of anticoagulants: Secondary | ICD-10-CM | POA: Diagnosis not present

## 2015-04-30 DIAGNOSIS — E785 Hyperlipidemia, unspecified: Secondary | ICD-10-CM | POA: Diagnosis not present

## 2015-04-30 DIAGNOSIS — I4819 Other persistent atrial fibrillation: Secondary | ICD-10-CM

## 2015-04-30 DIAGNOSIS — Z85820 Personal history of malignant melanoma of skin: Secondary | ICD-10-CM | POA: Diagnosis not present

## 2015-04-30 NOTE — Progress Notes (Signed)
Patient ID: Nancy [name], female   DOB: 17-Mar-1926, 82 y.o.   MRN: [id number]     Primary Care Physician: Gwen Pounds, MD Referring Physician: Dr. Dellie [name] is a 82 y.o. female with a h/o afib that is here for f/u. She has been found to be in afib, asymptomatic, undergoing rate control.She is not ware of arrythmia . She has no complaints. Exercised this am without any undue fatigue or shortness of breath. Continues on warfarin without bleeding issues. She is very active for her age .  Today, she denies symptoms of palpitations, chest pain, shortness of breath, orthopnea, PND, lower extremity edema, dizziness, presyncope, syncope, or neurologic sequela. The patient is tolerating medications without difficulties and is otherwise without complaint today.   Past Medical History  Diagnosis Date  . Migraine   . GERD (gastroesophageal reflux disease)   . Dyslipidemia   . History of scarlet fever   . PAF (paroxysmal atrial fibrillation) (HCC)   . Depression   . Melanoma (HCC)   . Mitral regurgitation     a. Echo (11/15):  EF 50-55%, Gr 2 DD, MAC, prolapsing P2 segment of post MV leaflet with mod to possibly severe MR, mild LAE, normal RVF, PASP 35 mmHg;  b. Echo 5/16: Mild LVH, EF 55-60%, trivial AI, moderate MR (LVID, ES 32.3 mm), mild BAE, moderate TR, PASP 48  . Hx of cardiovascular stress test     Lexiscan Myoview (12/15): No scar, no ischemia, EF 59%; Low Risk   Past Surgical History  Procedure Laterality Date  . Tonsillectomy    . Appendectomy    . Cataract extraction      bilateral  . Cesarean section      Current Outpatient Prescriptions  Medication Sig Dispense Refill  . B Complex Vitamins (B COMPLEX 100 PO) Take 1 tablet by mouth daily.    . calcium-vitamin D (OSCAL WITH D 500-200) 500-200 MG-UNIT per tablet Take 1 tablet by mouth daily.      . Coenzyme Q10 (CO Q 10 PO) Take 1 capsule by mouth daily.    . famotidine (PEPCID) 20 MG tablet Take 20 mg by mouth  at bedtime.    . furosemide (LASIX) 20 MG tablet TAKE 1 TABLET (20 MG TOTAL) BY MOUTH DAILY. 30 tablet 5  . metoprolol tartrate (LOPRESSOR) 25 MG tablet Take 0.5 tablets (12.5 mg total) by mouth 2 (two) times daily. 30 tablet 1  . Multiple Vitamins-Minerals (CENTRUM SILVER PO) Take 1 tablet by mouth daily.    . pantoprazole (PROTONIX) 40 MG tablet Take 1 tablet (40 mg total) by mouth daily. Take 30-60 min before first meal of the day 30 tablet 11  . triamcinolone cream (KENALOG) 0.1 % Apply 1 application topically 2 (two) times daily. 80 g 1  . vitamin B-12 (CYANOCOBALAMIN) 100 MCG tablet Take 100 mcg by mouth daily.    Marland Kitchen warfarin (COUMADIN) 5 MG tablet Take as directed by the [facility] 50 tablet 1   No current facility-administered medications for this encounter.    No Known Allergies  Social History   Social History  . Marital Status: Married    Spouse Name: N/A  . Number of Children: N/A  . Years of Education: N/A   Occupational History  . Not on file.   Social History Main Topics  . Smoking status: Former Smoker -- 6 years    Types: Cigarettes    Quit date: 09/09/1967  . Smokeless tobacco: Not on file  Comment: pt reports she smoked socially  . Alcohol Use: 0.6 oz/week    1 Glasses of wine per week  . Drug Use: No  . Sexual Activity: Not on file   Other Topics Concern  . Not on file   Social History Narrative    Family History  Problem Relation Age of Onset  . Colon polyps    . Heart attack Neg Hx   . Stroke Neg Hx   . Hypertension Neg Hx     ROS- All systems are reviewed and negative except as per the HPI above  Physical Exam: Filed Vitals:   04/30/15 1347  BP: 138/82  Pulse: 88  Height: 5\' 6"  (1.676 m)  Weight: 134 lb 9.6 oz (61.054 kg)    GEN- The patient is well appearing, alert and oriented x 3 today.   Head- normocephalic, atraumatic Eyes-  Sclera clear, conjunctiva pink Ears- hearing intact Oropharynx- clear Neck- supple, no  JVP Lymph- no cervical lymphadenopathy Lungs- Clear to ausculation bilaterally, normal work of breathing Heart- Irregular rate and rhythm, no murmurs, rubs or gallops, PMI not laterally displaced GI- soft, NT, ND, + BS Extremities- no clubbing, cyanosis, or edema MS- no significant deformity or atrophy Skin- no rash or lesion Psych- euthymic mood, full affect Neuro- strength and sensation are intact  EKG- Afib with rvr at 88 bpm,  QRS int 88 ms, QTc 498 ms. Epic records reviewed  Assessment and Plan: 1. PAF Asymptomatic afib today Feels well, not aware of any irregular heart beat Continue warfarin Now rate controlled on metoprolol 12.5 mg bid  F/u one month  Savannah Erbe [facility] [address] [place], [place] [address] [phone]

## 2015-05-13 DIAGNOSIS — I480 Paroxysmal atrial fibrillation: Secondary | ICD-10-CM | POA: Diagnosis not present

## 2015-05-13 DIAGNOSIS — Z7901 Long term (current) use of anticoagulants: Secondary | ICD-10-CM | POA: Diagnosis not present

## 2015-05-14 DIAGNOSIS — H18003 Unspecified corneal deposit, bilateral: Secondary | ICD-10-CM | POA: Diagnosis not present

## 2015-05-14 DIAGNOSIS — Z961 Presence of intraocular lens: Secondary | ICD-10-CM | POA: Diagnosis not present

## 2015-05-14 DIAGNOSIS — H524 Presbyopia: Secondary | ICD-10-CM | POA: Diagnosis not present

## 2015-05-28 DIAGNOSIS — Z7901 Long term (current) use of anticoagulants: Secondary | ICD-10-CM | POA: Diagnosis not present

## 2015-05-28 DIAGNOSIS — I480 Paroxysmal atrial fibrillation: Secondary | ICD-10-CM | POA: Diagnosis not present

## 2015-05-28 DIAGNOSIS — R42 Dizziness and giddiness: Secondary | ICD-10-CM | POA: Diagnosis not present

## 2015-05-28 DIAGNOSIS — R51 Headache: Secondary | ICD-10-CM | POA: Diagnosis not present

## 2015-05-29 ENCOUNTER — Other Ambulatory Visit: Payer: Self-pay

## 2015-05-29 ENCOUNTER — Ambulatory Visit (HOSPITAL_COMMUNITY)
Admission: RE | Admit: 2015-05-29 | Discharge: 2015-05-29 | Disposition: A | Discharge status: Home / Self Care | Payer: Medicare Other | Source: Ambulatory Visit | Attending: Nurse Practitioner | Admitting: Nurse Practitioner

## 2015-05-29 ENCOUNTER — Encounter (HOSPITAL_COMMUNITY): Payer: Self-pay | Admitting: Nurse Practitioner

## 2015-05-29 ENCOUNTER — Other Ambulatory Visit: Payer: Self-pay | Admitting: Internal Medicine

## 2015-05-29 VITALS — BP 124/80 | HR 130 | Ht 66.0 in | Wt 130.2 lb

## 2015-05-29 DIAGNOSIS — Z87891 Personal history of nicotine dependence: Secondary | ICD-10-CM | POA: Diagnosis not present

## 2015-05-29 DIAGNOSIS — I480 Paroxysmal atrial fibrillation: Secondary | ICD-10-CM | POA: Insufficient documentation

## 2015-05-29 DIAGNOSIS — I482 Chronic atrial fibrillation, unspecified: Secondary | ICD-10-CM

## 2015-05-29 DIAGNOSIS — E785 Hyperlipidemia, unspecified: Secondary | ICD-10-CM | POA: Diagnosis not present

## 2015-05-29 DIAGNOSIS — K219 Gastro-esophageal reflux disease without esophagitis: Secondary | ICD-10-CM | POA: Insufficient documentation

## 2015-05-29 DIAGNOSIS — Z79899 Other long term (current) drug therapy: Secondary | ICD-10-CM | POA: Diagnosis not present

## 2015-05-29 DIAGNOSIS — Z85820 Personal history of malignant melanoma of skin: Secondary | ICD-10-CM | POA: Diagnosis not present

## 2015-05-29 DIAGNOSIS — Z7901 Long term (current) use of anticoagulants: Secondary | ICD-10-CM | POA: Diagnosis not present

## 2015-05-29 DIAGNOSIS — I4891 Unspecified atrial fibrillation: Secondary | ICD-10-CM | POA: Diagnosis present

## 2015-05-29 DIAGNOSIS — B351 Tinea unguium: Secondary | ICD-10-CM | POA: Diagnosis not present

## 2015-05-29 DIAGNOSIS — R519 Headache, unspecified: Secondary | ICD-10-CM

## 2015-05-29 DIAGNOSIS — Z85828 Personal history of other malignant neoplasm of skin: Secondary | ICD-10-CM | POA: Diagnosis not present

## 2015-05-29 DIAGNOSIS — R51 Headache: Principal | ICD-10-CM

## 2015-05-29 MED ORDER — METOPROLOL TARTRATE 25 MG PO TABS: 25.0000 mg | ORAL_TABLET | Freq: Two times a day (BID) | ORAL | Status: DC

## 2015-05-29 NOTE — Progress Notes (Signed)
Patient ID: Nancy [name], female   DOB: 1925-09-03, 82 y.o.   MRN: [id number]     Primary Care Physician: [name], MD Referring Physician: Dr. Ardean Larsen is a 82 y.o. female with a h/o afib that is here for f/u. She has been found to be in afib, asymptomatic, undergoing rate control.She is not ware of arrythmia . She has no complaints. Exercised this am without any undue fatigue or shortness of breath. Continues on warfarin without bleeding issues. She is very active for her age . She is on metoprolol 12.5 mg bid for rate control.   She returns today with chronic afib with rvr. She is asymptomatic. She states that she is taking metoprolol. She continues on warfarin, dose recently adjusted. No complaints today.  Today, she denies symptoms of palpitations, chest pain, shortness of breath, orthopnea, PND, lower extremity edema, dizziness, presyncope, syncope, or neurologic sequela. The patient is tolerating medications without difficulties and is otherwise without complaint today.   Past Medical History  Diagnosis Date  . Migraine   . GERD (gastroesophageal reflux disease)   . Dyslipidemia   . History of scarlet fever   . PAF (paroxysmal atrial fibrillation) ([place])   . Depression   . Melanoma ([place])   . Mitral regurgitation     a. Echo (11/15):  EF 50-55%, Gr 2 DD, MAC, prolapsing P2 segment of post MV leaflet with mod to possibly severe MR, mild LAE, normal RVF, PASP 35 mmHg;  b. Echo 5/16: Mild LVH, EF 55-60%, trivial AI, moderate MR (LVID, ES 32.3 mm), mild BAE, moderate TR, PASP 48  . Hx of cardiovascular stress test     Lexiscan Myoview (12/15): No scar, no ischemia, EF 59%; Low Risk   Past Surgical History  Procedure Laterality Date  . Tonsillectomy    . Appendectomy    . Cataract extraction      bilateral  . Cesarean section      Current Outpatient Prescriptions  Medication Sig Dispense Refill  . B Complex Vitamins (B COMPLEX 100 PO) Take 1 tablet by mouth  daily.    . calcium-vitamin D (OSCAL WITH D 500-200) 500-200 MG-UNIT per tablet Take 1 tablet by mouth daily.      . Coenzyme Q10 (CO Q 10 PO) Take 1 capsule by mouth daily.    . famotidine (PEPCID) 20 MG tablet Take 20 mg by mouth at bedtime.    . furosemide (LASIX) 20 MG tablet TAKE 1 TABLET (20 MG TOTAL) BY MOUTH DAILY. 30 tablet 5  . metoprolol tartrate (LOPRESSOR) 25 MG tablet Take 1 tablet (25 mg total) by mouth 2 (two) times daily. 30 tablet 1  . Multiple Vitamins-Minerals (CENTRUM SILVER PO) Take 1 tablet by mouth daily.    . pantoprazole (PROTONIX) 40 MG tablet Take 1 tablet (40 mg total) by mouth daily. Take 30-60 min before first meal of the day 30 tablet 11  . triamcinolone cream (KENALOG) 0.1 % Apply 1 application topically 2 (two) times daily. 80 g 1  . vitamin B-12 (CYANOCOBALAMIN) 100 MCG tablet Take 100 mcg by mouth daily.    Marland Kitchen warfarin (COUMADIN) 5 MG tablet Take as directed by the [facility] 50 tablet 1   No current facility-administered medications for this encounter.    No Known Allergies  Social History   Social History  . Marital Status: Married    Spouse Name: N/A  . Number of Children: N/A  . Years of Education: N/A  Occupational History  . Not on file.   Social History Main Topics  . Smoking status: Former Smoker -- 6 years    Types: Cigarettes    Quit date: 09/09/1967  . Smokeless tobacco: Not on file     Comment: pt reports she smoked socially  . Alcohol Use: 0.6 oz/week    1 Glasses of wine per week  . Drug Use: No  . Sexual Activity: Not on file   Other Topics Concern  . Not on file   Social History Narrative    Family History  Problem Relation Age of Onset  . Colon polyps    . Heart attack Neg Hx   . Stroke Neg Hx   . Hypertension Neg Hx     ROS- All systems are reviewed and negative except as per the HPI above  Physical Exam: Filed Vitals:   05/29/15 1424  BP: 124/80  Pulse: 130  Height: 5\' 6"  (1.676 m)  Weight: 130 lb  3.2 oz (59.058 kg)    GEN- The patient is well appearing, alert and oriented x 3 today.   Head- normocephalic, atraumatic Eyes-  Sclera clear, conjunctiva pink Ears- hearing intact Oropharynx- clear Neck- supple, no JVP Lymph- no cervical lymphadenopathy Lungs- Clear to ausculation bilaterally, normal work of breathing Heart- Irregular rate and rhythm, no murmurs, rubs or gallops, PMI not laterally displaced GI- soft, NT, ND, + BS Extremities- no clubbing, cyanosis, or edema MS- no significant deformity or atrophy Skin- no rash or lesion Psych- euthymic mood, full affect Neuro- strength and sensation are intact  EKG- Afib with rvr at 130 bpm,  QRS int 76 ms, QTc 453 ms. Epic records reviewed  Assessment and Plan: 1. PAF Asymptomatic afib today Feels well, not aware of any irregular heart beat Continue warfarin Increase metoprolol to 25 mg bid  F/u BP/HR one week  Butch Penny C. [place], [facility] [address] [place], [place] [address] [phone]

## 2015-05-29 NOTE — Patient Instructions (Signed)
Your physician has recommended you make the following change in your medication:  1)Increase metoprolol to a whole tablet (25mg ) twice a day

## 2015-05-30 ENCOUNTER — Ambulatory Visit
Admission: RE | Admit: 2015-05-30 | Discharge: 2015-05-30 | Disposition: A | Discharge status: Home / Self Care | Payer: Medicare Other | Source: Ambulatory Visit | Attending: Internal Medicine | Admitting: Internal Medicine

## 2015-05-30 DIAGNOSIS — R51 Headache: Principal | ICD-10-CM

## 2015-05-30 DIAGNOSIS — R519 Headache, unspecified: Secondary | ICD-10-CM

## 2015-06-05 ENCOUNTER — Ambulatory Visit (HOSPITAL_COMMUNITY)
Admission: RE | Admit: 2015-06-05 | Discharge: 2015-06-05 | Disposition: A | Discharge status: Home / Self Care | Payer: Medicare Other | Source: Ambulatory Visit | Attending: Nurse Practitioner | Admitting: Nurse Practitioner

## 2015-06-05 DIAGNOSIS — Z7901 Long term (current) use of anticoagulants: Secondary | ICD-10-CM | POA: Diagnosis not present

## 2015-06-05 DIAGNOSIS — I480 Paroxysmal atrial fibrillation: Secondary | ICD-10-CM | POA: Insufficient documentation

## 2015-06-05 DIAGNOSIS — Z79899 Other long term (current) drug therapy: Secondary | ICD-10-CM | POA: Diagnosis not present

## 2015-06-10 DIAGNOSIS — Z7901 Long term (current) use of anticoagulants: Secondary | ICD-10-CM | POA: Diagnosis not present

## 2015-06-10 DIAGNOSIS — I480 Paroxysmal atrial fibrillation: Secondary | ICD-10-CM | POA: Diagnosis not present

## 2015-06-13 DIAGNOSIS — S92912A Unspecified fracture of left toe(s), initial encounter for closed fracture: Secondary | ICD-10-CM | POA: Diagnosis not present

## 2015-06-13 DIAGNOSIS — Z6822 Body mass index (BMI) 22.0-22.9, adult: Secondary | ICD-10-CM | POA: Diagnosis not present

## 2015-06-13 DIAGNOSIS — S99922A Unspecified injury of left foot, initial encounter: Secondary | ICD-10-CM | POA: Diagnosis not present

## 2015-06-13 DIAGNOSIS — S92352A Displaced fracture of fifth metatarsal bone, left foot, initial encounter for closed fracture: Secondary | ICD-10-CM | POA: Diagnosis not present

## 2015-06-14 DIAGNOSIS — S92355A Nondisplaced fracture of fifth metatarsal bone, left foot, initial encounter for closed fracture: Secondary | ICD-10-CM | POA: Diagnosis not present

## 2015-06-18 DIAGNOSIS — S92355D Nondisplaced fracture of fifth metatarsal bone, left foot, subsequent encounter for fracture with routine healing: Secondary | ICD-10-CM | POA: Diagnosis not present

## 2015-07-01 DIAGNOSIS — I480 Paroxysmal atrial fibrillation: Secondary | ICD-10-CM | POA: Diagnosis not present

## 2015-07-01 DIAGNOSIS — Z7901 Long term (current) use of anticoagulants: Secondary | ICD-10-CM | POA: Diagnosis not present

## 2015-07-02 DIAGNOSIS — S92355D Nondisplaced fracture of fifth metatarsal bone, left foot, subsequent encounter for fracture with routine healing: Secondary | ICD-10-CM | POA: Diagnosis not present

## 2015-07-05 DIAGNOSIS — I214 Non-ST elevation (NSTEMI) myocardial infarction: Secondary | ICD-10-CM

## 2015-07-05 HISTORY — DX: Non-ST elevation (NSTEMI) myocardial infarction: I21.4

## 2015-07-16 ENCOUNTER — Other Ambulatory Visit: Payer: Self-pay | Admitting: Internal Medicine

## 2015-07-17 ENCOUNTER — Ambulatory Visit: Payer: Self-pay | Admitting: Internal Medicine

## 2015-07-17 DIAGNOSIS — I482 Chronic atrial fibrillation, unspecified: Secondary | ICD-10-CM

## 2015-07-17 DIAGNOSIS — Z5181 Encounter for therapeutic drug level monitoring: Secondary | ICD-10-CM

## 2015-07-17 DIAGNOSIS — I340 Nonrheumatic mitral (valve) insufficiency: Secondary | ICD-10-CM

## 2015-07-20 ENCOUNTER — Emergency Department (HOSPITAL_COMMUNITY): Payer: Medicare Other

## 2015-07-20 ENCOUNTER — Inpatient Hospital Stay (HOSPITAL_COMMUNITY)
Admission: EM | Admit: 2015-07-20 | Discharge: 2015-07-22 | DRG: 287 | Disposition: A | Discharge status: Home / Self Care | Payer: Medicare Other | Attending: Internal Medicine | Admitting: Internal Medicine

## 2015-07-20 ENCOUNTER — Encounter (HOSPITAL_COMMUNITY): Payer: Self-pay | Admitting: Emergency Medicine

## 2015-07-20 DIAGNOSIS — I341 Nonrheumatic mitral (valve) prolapse: Secondary | ICD-10-CM | POA: Diagnosis present

## 2015-07-20 DIAGNOSIS — I340 Nonrheumatic mitral (valve) insufficiency: Secondary | ICD-10-CM | POA: Diagnosis present

## 2015-07-20 DIAGNOSIS — I201 Angina pectoris with documented spasm: Secondary | ICD-10-CM | POA: Diagnosis present

## 2015-07-20 DIAGNOSIS — I482 Chronic atrial fibrillation: Secondary | ICD-10-CM | POA: Diagnosis present

## 2015-07-20 DIAGNOSIS — R0602 Shortness of breath: Secondary | ICD-10-CM | POA: Diagnosis not present

## 2015-07-20 DIAGNOSIS — K219 Gastro-esophageal reflux disease without esophagitis: Secondary | ICD-10-CM | POA: Diagnosis present

## 2015-07-20 DIAGNOSIS — R7989 Other specified abnormal findings of blood chemistry: Secondary | ICD-10-CM

## 2015-07-20 DIAGNOSIS — Z7901 Long term (current) use of anticoagulants: Secondary | ICD-10-CM

## 2015-07-20 DIAGNOSIS — I480 Paroxysmal atrial fibrillation: Secondary | ICD-10-CM | POA: Diagnosis present

## 2015-07-20 DIAGNOSIS — R748 Abnormal levels of other serum enzymes: Secondary | ICD-10-CM | POA: Diagnosis present

## 2015-07-20 DIAGNOSIS — N183 Chronic kidney disease, stage 3 (moderate): Secondary | ICD-10-CM | POA: Diagnosis present

## 2015-07-20 DIAGNOSIS — Z8673 Personal history of transient ischemic attack (TIA), and cerebral infarction without residual deficits: Secondary | ICD-10-CM

## 2015-07-20 DIAGNOSIS — E785 Hyperlipidemia, unspecified: Secondary | ICD-10-CM | POA: Diagnosis present

## 2015-07-20 DIAGNOSIS — Z79899 Other long term (current) drug therapy: Secondary | ICD-10-CM | POA: Diagnosis not present

## 2015-07-20 DIAGNOSIS — R778 Other specified abnormalities of plasma proteins: Secondary | ICD-10-CM

## 2015-07-20 DIAGNOSIS — R011 Cardiac murmur, unspecified: Secondary | ICD-10-CM | POA: Diagnosis not present

## 2015-07-20 DIAGNOSIS — I214 Non-ST elevation (NSTEMI) myocardial infarction: Secondary | ICD-10-CM | POA: Diagnosis not present

## 2015-07-20 DIAGNOSIS — Z87891 Personal history of nicotine dependence: Secondary | ICD-10-CM

## 2015-07-20 DIAGNOSIS — I4891 Unspecified atrial fibrillation: Secondary | ICD-10-CM | POA: Diagnosis present

## 2015-07-20 DIAGNOSIS — R739 Hyperglycemia, unspecified: Secondary | ICD-10-CM | POA: Diagnosis present

## 2015-07-20 DIAGNOSIS — I5032 Chronic diastolic (congestive) heart failure: Secondary | ICD-10-CM | POA: Diagnosis present

## 2015-07-20 DIAGNOSIS — R0789 Other chest pain: Secondary | ICD-10-CM | POA: Diagnosis not present

## 2015-07-20 DIAGNOSIS — I5041 Acute combined systolic (congestive) and diastolic (congestive) heart failure: Secondary | ICD-10-CM | POA: Diagnosis present

## 2015-07-20 DIAGNOSIS — N289 Disorder of kidney and ureter, unspecified: Secondary | ICD-10-CM | POA: Diagnosis not present

## 2015-07-20 DIAGNOSIS — I130 Hypertensive heart and chronic kidney disease with heart failure and stage 1 through stage 4 chronic kidney disease, or unspecified chronic kidney disease: Secondary | ICD-10-CM | POA: Diagnosis present

## 2015-07-20 DIAGNOSIS — R079 Chest pain, unspecified: Secondary | ICD-10-CM | POA: Diagnosis present

## 2015-07-20 DIAGNOSIS — R791 Abnormal coagulation profile: Secondary | ICD-10-CM | POA: Diagnosis not present

## 2015-07-20 HISTORY — DX: Chronic kidney disease, stage 3 unspecified: N18.30

## 2015-07-20 HISTORY — DX: Chronic kidney disease, stage 3 (moderate): N18.3

## 2015-07-20 HISTORY — DX: Non-ST elevation (NSTEMI) myocardial infarction: I21.4

## 2015-07-20 LAB — CBC WITH DIFFERENTIAL/PLATELET
BASOS ABS: 0 10*3/uL (ref 0.0–0.1)
BASOS PCT: 0 %
EOS PCT: 1 %
Eosinophils Absolute: 0.1 10*3/uL (ref 0.0–0.7)
HCT: 43.4 % (ref 36.0–46.0)
Hemoglobin: 14.2 g/dL (ref 12.0–15.0)
LYMPHS PCT: 16 %
Lymphs Abs: 1.4 10*3/uL (ref 0.7–4.0)
MCH: 30.5 pg (ref 26.0–34.0)
MCHC: 32.7 g/dL (ref 30.0–36.0)
MCV: 93.1 fL (ref 78.0–100.0)
Monocytes Absolute: 0.6 10*3/uL (ref 0.1–1.0)
Monocytes Relative: 6 %
Neutro Abs: 6.9 10*3/uL (ref 1.7–7.7)
Neutrophils Relative %: 77 %
PLATELETS: 234 10*3/uL (ref 150–400)
RBC: 4.66 MIL/uL (ref 3.87–5.11)
RDW: 14.4 % (ref 11.5–15.5)
WBC: 9 10*3/uL (ref 4.0–10.5)

## 2015-07-20 LAB — COMPREHENSIVE METABOLIC PANEL
ALBUMIN: 3.6 g/dL (ref 3.5–5.0)
ALT: 40 U/L (ref 14–54)
AST: 52 U/L — AB (ref 15–41)
Alkaline Phosphatase: 80 U/L (ref 38–126)
Anion gap: 11 (ref 5–15)
BUN: 19 mg/dL (ref 6–20)
CHLORIDE: 100 mmol/L — AB (ref 101–111)
CO2: 27 mmol/L (ref 22–32)
CREATININE: 1.4 mg/dL — AB (ref 0.44–1.00)
Calcium: 9.9 mg/dL (ref 8.9–10.3)
GFR calc Af Amer: 38 mL/min — ABNORMAL LOW (ref >60)
GFR, EST NON AFRICAN AMERICAN: 33 mL/min — AB (ref >60)
GLUCOSE: 139 mg/dL — AB (ref 65–99)
POTASSIUM: 4 mmol/L (ref 3.5–5.1)
SODIUM: 138 mmol/L (ref 135–145)
Total Bilirubin: 1.2 mg/dL (ref 0.3–1.2)
Total Protein: 7.5 g/dL (ref 6.5–8.1)

## 2015-07-20 LAB — I-STAT TROPONIN, ED: Troponin i, poc: 0.72 ng/mL (ref 0.00–0.08)

## 2015-07-20 LAB — HEPARIN LEVEL (UNFRACTIONATED): Heparin Unfractionated: 0.68 IU/mL (ref 0.30–0.70)

## 2015-07-20 LAB — MRSA PCR SCREENING: MRSA by PCR: NEGATIVE

## 2015-07-20 LAB — PROTIME-INR
INR: 1.21 (ref 0.00–1.49)
PROTHROMBIN TIME: 15.4 s — AB (ref 11.6–15.2)

## 2015-07-20 LAB — TROPONIN I
Troponin I: 2.64 ng/mL (ref <0.031)
Troponin I: 7.17 ng/mL (ref <0.031)

## 2015-07-20 MED ORDER — SODIUM CHLORIDE 0.9 % IV SOLN: 250.0000 mL | INTRAVENOUS | Status: DC | PRN

## 2015-07-20 MED ORDER — ASPIRIN 81 MG PO CHEW
324.0000 mg | CHEWABLE_TABLET | ORAL | Status: AC
  Administered 2015-07-20: 324 mg via ORAL
  Filled 2015-07-20: qty 4

## 2015-07-20 MED ORDER — ASPIRIN EC 81 MG PO TBEC
81.0000 mg | DELAYED_RELEASE_TABLET | Freq: Every day | ORAL | Status: DC
  Administered 2015-07-22: 81 mg via ORAL
  Filled 2015-07-20: qty 1

## 2015-07-20 MED ORDER — SODIUM CHLORIDE 0.9% FLUSH
3.0000 mL | Freq: Two times a day (BID) | INTRAVENOUS | Status: DC
  Administered 2015-07-20: 3 mL via INTRAVENOUS

## 2015-07-20 MED ORDER — ASPIRIN 300 MG RE SUPP: 300.0000 mg | RECTAL | Status: AC

## 2015-07-20 MED ORDER — ROSUVASTATIN CALCIUM 10 MG PO TABS
40.0000 mg | ORAL_TABLET | Freq: Every day | ORAL | Status: DC
  Administered 2015-07-20 – 2015-07-22 (×3): 40 mg via ORAL
  Filled 2015-07-20 (×3): qty 4

## 2015-07-20 MED ORDER — HEPARIN (PORCINE) IN NACL 100-0.45 UNIT/ML-% IJ SOLN
750.0000 [IU]/h | INTRAMUSCULAR | Status: DC
  Administered 2015-07-20: 850 [IU]/h via INTRAVENOUS
  Filled 2015-07-20: qty 250

## 2015-07-20 MED ORDER — FUROSEMIDE 20 MG PO TABS
20.0000 mg | ORAL_TABLET | Freq: Every day | ORAL | Status: DC
  Administered 2015-07-20 – 2015-07-22 (×2): 20 mg via ORAL
  Filled 2015-07-20 (×2): qty 1

## 2015-07-20 MED ORDER — CALCIUM CARBONATE-VITAMIN D 500-200 MG-UNIT PO TABS
1.0000 | ORAL_TABLET | Freq: Every day | ORAL | Status: DC
  Administered 2015-07-21 – 2015-07-22 (×2): 1 via ORAL
  Filled 2015-07-20 (×2): qty 1

## 2015-07-20 MED ORDER — ALBUTEROL SULFATE (2.5 MG/3ML) 0.083% IN NEBU: 5.0000 mg | INHALATION_SOLUTION | Freq: Once | RESPIRATORY_TRACT | Status: DC

## 2015-07-20 MED ORDER — ASPIRIN 81 MG PO CHEW
81.0000 mg | CHEWABLE_TABLET | ORAL | Status: AC
  Administered 2015-07-21: 81 mg via ORAL
  Filled 2015-07-20: qty 1

## 2015-07-20 MED ORDER — SODIUM CHLORIDE 0.9 % IV SOLN
INTRAVENOUS | Status: DC
  Administered 2015-07-20: 21:00:00 via INTRAVENOUS

## 2015-07-20 MED ORDER — METOPROLOL TARTRATE 25 MG PO TABS
25.0000 mg | ORAL_TABLET | Freq: Two times a day (BID) | ORAL | Status: DC
  Administered 2015-07-20 – 2015-07-21 (×3): 25 mg via ORAL
  Filled 2015-07-20 (×4): qty 1

## 2015-07-20 MED ORDER — METOPROLOL TARTRATE 1 MG/ML IV SOLN
2.5000 mg | Freq: Once | INTRAVENOUS | Status: AC
  Administered 2015-07-20: 2.5 mg via INTRAVENOUS
  Filled 2015-07-20: qty 5

## 2015-07-20 MED ORDER — ONDANSETRON HCL 4 MG/2ML IJ SOLN: 4.0000 mg | Freq: Four times a day (QID) | INTRAMUSCULAR | Status: DC | PRN

## 2015-07-20 MED ORDER — ACETAMINOPHEN 325 MG PO TABS: 650.0000 mg | ORAL_TABLET | ORAL | Status: DC | PRN

## 2015-07-20 MED ORDER — SODIUM CHLORIDE 0.9% FLUSH: 3.0000 mL | INTRAVENOUS | Status: DC | PRN

## 2015-07-20 MED ORDER — NITROGLYCERIN 0.4 MG SL SUBL: 0.4000 mg | SUBLINGUAL_TABLET | SUBLINGUAL | Status: DC | PRN

## 2015-07-20 NOTE — Progress Notes (Signed)
CRITICAL VALUE ALERT  Critical value received:  Troponin 7.17  Date of notification:  07/20/2015  Time of notification:  19:45 pm No notification, I saw in epic  Critical value read back:No.  Nurse who received alert:  Shelba Flake, RN  MD notified (1st page):  Angelica Pou, MD  Time of first page:19:45      Responding MD:  Angelica Pou, MD  Time MD responded: 19:46

## 2015-07-20 NOTE — ED Notes (Signed)
Meal ordered for PT at this time.  

## 2015-07-20 NOTE — ED Provider Notes (Addendum)
CSN: [id number]     Arrival date & time 07/20/15  0910 History   None    No chief complaint on file.    (Consider location/radiation/quality/duration/timing/severity/associated sxs/prior Treatment) HPI Complaint of shortness of breath, heaviness at anterior chest and sweatiness awaken her from sleep 2:30 AM today. Symptoms resolved immediately prior to arrival here, no treatment prior to coming here. No other associated symptoms. Nothing makes symptoms better or worse. Past Medical History  Diagnosis Date  . Migraine   . GERD (gastroesophageal reflux disease)   . Dyslipidemia   . History of scarlet fever   . PAF (paroxysmal atrial fibrillation) ([place])   . Depression   . Melanoma ([place])   . Mitral regurgitation     a. Echo (11/15):  EF 50-55%, Gr 2 DD, MAC, prolapsing P2 segment of post MV leaflet with mod to possibly severe MR, mild LAE, normal RVF, PASP 35 mmHg;  b. Echo 5/16: Mild LVH, EF 55-60%, trivial AI, moderate MR (LVID, ES 32.3 mm), mild BAE, moderate TR, PASP 48  . Hx of cardiovascular stress test     Lexiscan Myoview (12/15): No scar, no ischemia, EF 59%; Low Risk   Past Surgical History  Procedure Laterality Date  . Tonsillectomy    . Appendectomy    . Cataract extraction      bilateral  . Cesarean section     Family History  Problem Relation Age of Onset  . Colon polyps    . Heart attack Neg Hx   . Stroke Neg Hx   . Hypertension Neg Hx    Social History  Substance Use Topics  . Smoking status: Former Smoker -- 6 years    Types: Cigarettes    Quit date: 09/09/1967  . Smokeless tobacco: Not on file     Comment: pt reports she smoked socially  . Alcohol Use: 0.6 oz/week    1 Glasses of wine per week   OB History    No data available     Review of Systems  Constitutional: Positive for diaphoresis.  HENT: Negative.   Respiratory: Positive for shortness of breath.   Cardiovascular: Positive for chest pain.  Gastrointestinal: Negative.   Musculoskeletal:  Positive for arthralgias.       Walking boot on left lower extremity from prior fracture  Skin: Negative.   Neurological: Negative.   Psychiatric/Behavioral: Negative.   All other systems reviewed and are negative.     Allergies  Review of patient's allergies indicates no known allergies.  Home Medications   Prior to Admission medications   Medication Sig Start Date End Date Taking? Authorizing Provider  B Complex Vitamins (B COMPLEX 100 PO) Take 1 tablet by mouth daily.    Historical Provider, MD  calcium-vitamin D (OSCAL WITH D 500-200) 500-200 MG-UNIT per tablet Take 1 tablet by mouth daily.      Historical Provider, MD  Coenzyme Q10 (CO Q 10 PO) Take 1 capsule by mouth daily.    Historical Provider, MD  famotidine (PEPCID) 20 MG tablet Take 20 mg by mouth at bedtime.    Historical Provider, MD  furosemide (LASIX) 20 MG tablet TAKE 1 TABLET (20 MG TOTAL) BY MOUTH DAILY. 01/29/15   Sherran Needs, NP  metoprolol tartrate (LOPRESSOR) 25 MG tablet Take 1 tablet (25 mg total) by mouth 2 (two) times daily. 05/29/15   Sherran Needs, NP  Multiple Vitamins-Minerals (CENTRUM SILVER PO) Take 1 tablet by mouth daily.    Historical Provider, MD  pantoprazole (PROTONIX) 40 MG tablet Take 1 tablet (40 mg total) by mouth daily. Take 30-60 min before first meal of the day 08/27/14   Tanda Rockers, MD  triamcinolone cream (KENALOG) 0.1 % Apply 1 application topically 2 (two) times daily. 12/18/14   Nolon Rod, DO  vitamin B-12 (CYANOCOBALAMIN) 100 MCG tablet Take 100 mcg by mouth daily.    Historical Provider, MD  warfarin (COUMADIN) 5 MG tablet Take as directed by the [facility] 10/28/14   Thompson Grayer, MD   There were no vitals taken for this visit. Physical Exam  Constitutional: She appears well-developed and well-nourished.  HENT:  Head: Normocephalic and atraumatic.  Eyes: Conjunctivae are normal. Pupils are equal, round, and reactive to light.  Neck: Neck supple. No tracheal  deviation present. No thyromegaly present.  Cardiovascular: Normal rate.   No murmur heard. Irregularly irregular  Pulmonary/Chest: Effort normal and breath sounds normal.  Abdominal: Soft. Bowel sounds are normal. She exhibits no distension. There is no tenderness.  Musculoskeletal: Normal range of motion. She exhibits no edema or tenderness.  Walking boot on left lower extremity. Otherwise no edema or tenderness  Neurological: She is alert. Coordination normal.  Skin: Skin is warm and dry. No rash noted.  Psychiatric: She has a normal mood and affect.  Nursing note and vitals reviewed.   ED Course  Procedures (including critical care time) Labs Review Labs Reviewed - No data to display  Imaging Review No results found. I have personally reviewed and evaluated these images and lab results as part of my medical decision-making.   EKG Interpretation   Date/Time:  Sunday July 20 2015 09:21:15 EDT Ventricular Rate:  114 PR Interval:    QRS Duration: 91 QT Interval:  356 QTC Calculation: 490 R Axis:   50 Text Interpretation:  Atrial fibrillation Anteroseptal infarct, old  Borderline repol abnrm, inferolateral leads Since last tracing rate slower  Confirmed by Winfred Leeds  MD, Nicklos Gaxiola [id number]) on 07/20/2015 9:40:54 AM     Chest x-ray viewed by me Results for orders placed or performed during the hospital encounter of 07/20/15  CBC with Differential  Result Value Ref Range   WBC 9.0 4.0 - 10.5 K/uL   RBC 4.66 3.87 - 5.11 MIL/uL   Hemoglobin 14.2 12.0 - 15.0 g/dL   HCT 43.4 36.0 - 46.0 %   MCV 93.1 78.0 - 100.0 fL   MCH 30.5 26.0 - 34.0 pg   MCHC 32.7 30.0 - 36.0 g/dL   RDW 14.4 11.5 - 15.5 %   Platelets 234 150 - 400 K/uL   Neutrophils Relative % 77 %   Neutro Abs 6.9 1.7 - 7.7 K/uL   Lymphocytes Relative 16 %   Lymphs Abs 1.4 0.7 - 4.0 K/uL   Monocytes Relative 6 %   Monocytes Absolute 0.6 0.1 - 1.0 K/uL   Eosinophils Relative 1 %   Eosinophils Absolute 0.1 0.0 - 0.7  K/uL   Basophils Relative 0 %   Basophils Absolute 0.0 0.0 - 0.1 K/uL  Comprehensive metabolic panel  Result Value Ref Range   Sodium 138 135 - 145 mmol/L   Potassium 4.0 3.5 - 5.1 mmol/L   Chloride 100 (L) 101 - 111 mmol/L   CO2 27 22 - 32 mmol/L   Glucose, Bld 139 (H) 65 - 99 mg/dL   BUN 19 6 - 20 mg/dL   Creatinine, Ser 1.40 (H) 0.44 - 1.00 mg/dL   Calcium 9.9 8.9 - 10.3 mg/dL  Total Protein 7.5 6.5 - 8.1 g/dL   Albumin 3.6 3.5 - 5.0 g/dL   AST 52 (H) 15 - 41 U/L   ALT 40 14 - 54 U/L   Alkaline Phosphatase 80 38 - 126 U/L   Total Bilirubin 1.2 0.3 - 1.2 mg/dL   GFR calc non Af Amer 33 (L) >60 mL/min   GFR calc Af Amer 38 (L) >60 mL/min   Anion gap 11 5 - 15  Protime-INR  Result Value Ref Range   Prothrombin Time 15.4 (H) 11.6 - 15.2 seconds   INR 1.21 0.00 - 1.49  I-Stat Troponin, ED (not at [facility])  Result Value Ref Range   Troponin i, poc 0.72 (HH) 0.00 - 0.08 ng/mL   Comment NOTIFIED PHYSICIAN    Comment 3           Dg Chest Port 1 View  07/20/2015  CLINICAL DATA:  Shortness of breath EXAM: PORTABLE CHEST 1 VIEW COMPARISON:  08/27/2014 chest radiograph. FINDINGS: Stable cardiomediastinal silhouette with top-normal heart size. No pneumothorax. No pleural effusion. Lungs appear clear, with no acute consolidative airspace disease and no pulmonary edema. IMPRESSION: No active disease. Electronically Signed   By: Ilona Sorrel M.D.   On: 07/20/2015 10:42    MDM  Dr Delray Alt cardiology service Consulted. Will see patient in EDAnd arrange for admission Final diagnoses:  None   INR is subtherapeutic. Renal insufficiency is chronic Diagnosis #1 NSTEMI #2 hyperglycemia #3renal insufficiency #4 subtheraputic INR     Orlie Dakin, MD 07/20/15 1107  Orlie Dakin, MD 07/20/15 1228

## 2015-07-20 NOTE — H&P (Signed)
CARDIOLOGY History and Physical    Patient IDJennalyn [name] MRN: [id number] DOB/AGE: [birth year] 82 y.o.  Admit date: 07/20/2015  Primary Physician   [name], MD Primary Cardiologist: Dr. Rayann Heman Reason for Consultation: Elevated troponin.   HPI: Nancy [name] is a 82 year old female with a past medical history of PAF (on Coumadin), moderate MR, HLD, and TIA.   She has never had a heart catheterization, had low risk Myoview in December 2015.  Last Echo was in May 2016 that showed EF of 55-60%, with mild LVH, moderate mitral regurgitation.   She was awaken from sleep this morning at 2:30 am with chest pressure that she describes as an "elephant on her chest" and SOB. She reports diffuse diaphoresis at that time. Pain lasted about 2 hours, and then resolved on their own. She has been chest pain free since arrival to the ED.  She has never had pain like this before in the past.  Of note, she has noticed some increasing SOB over the past 2-3 weeks with her daily activities and with walking.   EKG shows Afib with rate in 110's, no ST elevation. Troponin is elevated at 0.72.    She lives with her son but is moving to an [place] facility next week.  She fell about 6 weeks ago and broke her left foot, she is currently wearing a boot.    Denies SOB, palpitations, and chest pain.  Past Medical History  Diagnosis Date  . Migraine   . GERD (gastroesophageal reflux disease)   . Dyslipidemia   . History of scarlet fever   . PAF (paroxysmal atrial fibrillation) ([place])   . Depression   . Melanoma ([place])   . Mitral regurgitation     a. Echo (11/15):  EF 50-55%, Gr 2 DD, MAC, prolapsing P2 segment of post MV leaflet with mod to possibly severe MR, mild LAE, normal RVF, PASP 35 mmHg;  b. Echo 5/16: Mild LVH, EF 55-60%, trivial AI, moderate MR (LVID, ES 32.3 mm), mild BAE, moderate TR, PASP 48  . Hx of cardiovascular stress test     Lexiscan Myoview (12/15): No scar, no ischemia, EF  59%; Low Risk     Past Surgical History  Procedure Laterality Date  . Tonsillectomy    . Appendectomy    . Cataract extraction      bilateral  . Cesarean section       I have reviewed the patient's current medications Prior to Admission medications   Medication Sig Start Date End Date Taking? Authorizing Provider  B Complex Vitamins (B COMPLEX 100 PO) Take 1 tablet by mouth daily.    Historical Provider, MD  calcium-vitamin D (OSCAL WITH D 500-200) 500-200 MG-UNIT per tablet Take 1 tablet by mouth daily.      Historical Provider, MD  Coenzyme Q10 (CO Q 10 PO) Take 1 capsule by mouth daily.    Historical Provider, MD  famotidine (PEPCID) 20 MG tablet Take 20 mg by mouth at bedtime.    Historical Provider, MD  furosemide (LASIX) 20 MG tablet TAKE 1 TABLET (20 MG TOTAL) BY MOUTH DAILY. 01/29/15   Sherran Needs, NP  metoprolol tartrate (LOPRESSOR) 25 MG tablet Take 1 tablet (25 mg total) by mouth 2 (two) times daily. 05/29/15   Sherran Needs, NP  Multiple Vitamins-Minerals (CENTRUM SILVER PO) Take 1 tablet by mouth daily.    Historical Provider, MD  pantoprazole (PROTONIX) 40 MG tablet Take 1  tablet (40 mg total) by mouth daily. Take 30-60 min before first meal of the day 08/27/14   Tanda Rockers, MD  triamcinolone cream (KENALOG) 0.1 % Apply 1 application topically 2 (two) times daily. 12/18/14   Nolon Rod, DO  vitamin B-12 (CYANOCOBALAMIN) 100 MCG tablet Take 100 mcg by mouth daily.    Historical Provider, MD  warfarin (COUMADIN) 5 MG tablet Take as directed by the [facility] 10/28/14   Thompson Grayer, MD     Social History   Social History  . Marital Status: Married    Spouse Name: N/A  . Number of Children: N/A  . Years of Education: N/A   Occupational History  . Not on file.   Social History Main Topics  . Smoking status: Former Smoker -- 6 years    Types: Cigarettes    Quit date: 09/09/1967  . Smokeless tobacco: Not on file     Comment: pt reports she smoked  socially  . Alcohol Use: 0.6 oz/week    1 Glasses of wine per week  . Drug Use: No  . Sexual Activity: Not on file   Other Topics Concern  . Not on file   Social History Narrative    Family Status  Relation Status Death Age  . Mother Deceased   . Father Deceased    Family History  Problem Relation Age of Onset  . Colon polyps    . Heart attack Neg Hx   . Stroke Neg Hx   . Hypertension Neg Hx      ROS:  Full 14 point review of systems complete and found to be negative unless listed above.  Physical Exam: Blood pressure 151/91, pulse 81, temperature 97.8 F (36.6 C), temperature source Oral, resp. rate 17, height 5\' 6"  (1.676 m), weight 130 lb (58.968 kg), SpO2 98 %.  General: Well developed, well nourished, female in no acute distress Head: Eyes PERRLA, No xanthomas.   Normocephalic and atraumatic, oropharynx without edema or exudate.   Lungs: CTA Heart: Heart irregular rate and rhythm with S1, S2. Pulses are 2+ extrem.   Neck: No carotid bruits. No lymphadenopathy.  No JVD. Abdomen: Bowel sounds present, abdomen soft and non-tender without masses or hernias noted. Msk:  No spine or cva tenderness. No weakness, no joint deformities or effusions. Extremities: No clubbing or cyanosis.  No edema.  Neuro: Alert and oriented X 3. No focal deficits noted. Psych:  Good affect, responds appropriately Skin: No rashes or lesions noted.  Labs:   Lab Results  Component Value Date   WBC 9.0 07/20/2015   HGB 14.2 07/20/2015   HCT 43.4 07/20/2015   MCV 93.1 07/20/2015   PLT 234 07/20/2015    Recent Labs  07/20/15 0935  INR 1.21     Recent Labs Lab 07/20/15 0935  NA 138  K 4.0  CL 100*  CO2 27  BUN 19  CREATININE 1.40*  CALCIUM 9.9  PROT 7.5  BILITOT 1.2  ALKPHOS 80  ALT 40  AST 52*  GLUCOSE 139*  ALBUMIN 3.6    Echo: May 2016 - Left ventricle: The cavity size was normal. Wall thickness was  increased in a pattern of mild LVH. Systolic function was  normal.  The estimated ejection fraction was in the range of 55% to 60%. - Aortic valve: There was trivial regurgitation. - Mitral valve: There was moderate regurgitation. - Left atrium: The atrium was mildly dilated. - Right atrium: The atrium was mildly dilated. -  Tricuspid valve: There was moderate regurgitation. - Pulmonary arteries: PA peak pressure: 48 mm Hg (S).  ECG:  Afib.    ASSESSMENT AND PLAN:    Principal Problem:   NSTEMI (non-ST elevated myocardial infarction) ([place]) Active Problems:   HLD (hyperlipidemia)   Atrial fibrillation ([facility])   Mitral regurgitation   Chronic diastolic CHF (congestive heart failure) (H[facility])   Elevated troponin  1. Elevated troponin/NSTEMI: Patient reports increased SOB recently with daily activities and was awakened from sleep with intense chest pressure.  Her pain has resolved currently. We will follow troponin. She will need cath on Monday, and heparin gtt initiation. We will hold her coumadin.   2. Chronic Atrial fib: patient is followed by EP.  She is rate controlled with metoprolol.  On warfarin but INR low.   3. HTN: She has been hypertensive with SBP's in 150's.  She has not received her metoprolol today.  Review of recent office visits show well controlled BP's.  Will monitor while in hospital.    4. Elevated liver enzymes: Her AST is elevated at 52, note that she was started on Lamisil at the beginning of March.  5. CKD: Her creatinine is 1.4 today, this is her baseline.   Signed: Arbutus Leas, NP 07/20/2015 11:02 AM Pager [phone]  Co-Sign MD  Patient seen and examined with Jettie Booze NP-C. We discussed all aspects of the encounter. I agree with the assessment and plan as stated above.   She has classic USA/NSTEMI. Now pain free. Will admit for cath. Start heparin, ASA, b-blocker. Hold warfarin. If needs stent would consider Eliquis and Plavix. (Previously could not afford Xarelto so would need case manager consult to help).  She has h/o mild dementia. Watch for sundowning. Admit to SDU.   I have reviewed the risks, indications, and alternatives to angioplasty and stenting with the patient. Risks include but are not limited to bleeding, infection, vascular injury, stroke, myocardial infection, arrhythmia, kidney injury, radiation-related injury in the case of prolonged fluoroscopy use, emergency cardiac surgery, and death. The patient understands the risks of serious complication is low (123456) and he agrees to proceed.   Bensimhon, Daniel,MD 11:52 AM

## 2015-07-20 NOTE — Progress Notes (Signed)
ANTICOAGULATION CONSULT NOTE - Initial Consult  Pharmacy Consult for heparin Indication: chest pain/ACS and atrial fibrillation  No Known Allergies  Patient Measurements: Height: 5\' 6"  (167.6 cm) Weight: 133 lb 6.4 oz (60.51 kg) IBW/kg (Calculated) : 59.3  Vital Signs: Temp: 98.4 F (36.9 C) (04/16 2128) Temp Source: Oral (04/16 2128) BP: 125/75 mmHg (04/16 2128) Pulse Rate: 111 (04/16 2128)  Labs:  Recent Labs  07/20/15 0935 07/20/15 1244 07/20/15 1821 07/20/15 2121  HGB 14.2  --   --   --   HCT 43.4  --   --   --   PLT 234  --   --   --   LABPROT 15.4*  --   --   --   INR 1.21  --   --   --   HEPARINUNFRC  --   --   --  0.68  CREATININE 1.40*  --   --   --   TROPONINI  --  2.64* 7.17*  --     Estimated Creatinine Clearance: 26.5 mL/min (by C-G formula based on Cr of 1.4).   Medical History: Past Medical History  Diagnosis Date  . Migraine   . GERD (gastroesophageal reflux disease)   . Dyslipidemia   . History of scarlet fever   . PAF (paroxysmal atrial fibrillation) ([place])   . Depression   . Melanoma ([place])   . Mitral regurgitation     a. Echo (11/15):  EF 50-55%, Gr 2 DD, MAC, prolapsing P2 segment of post MV leaflet with mod to possibly severe MR, mild LAE, normal RVF, PASP 35 mmHg;  b. Echo 5/16: Mild LVH, EF 55-60%, trivial AI, moderate MR (LVID, ES 32.3 mm), mild BAE, moderate TR, PASP 48  . Hx of cardiovascular stress test     Lexiscan Myoview (12/15): No scar, no ischemia, EF 59%; Low Risk    Assessment: 82 yo F presents on 4/16 with SOB and chest pressure. Cards consulted since troponin elevated. She is on coumadin 2.5mg  daily PTA for Afib. Last dose was on 4/15. INR on admit low at 1.21. Pharmacy consulted to switch to heparin in preparation for NSTEMI / Afib work up and cath on Monday.   HL at goal 0.68, no bleeding issues noted. Will leave at current rate and follow up with am labs.  Goal of Therapy:  Heparin level 0.3-0.7 units/ml Monitor  platelets by anticoagulation protocol: Yes   Plan:  Continue heparin gtt at 850 units/hr Monitor daily HL, CBC, s/s of bleed Cards plans to take to cath lab on 4/17 F/U coumadin restart after procedures  Erin Hearing PharmD., BCPS Clinical Pharmacist Pager [phone] 07/20/2015 10:00 PM

## 2015-07-20 NOTE — ED Notes (Signed)
Dr. Lenna Sciara informed of elevated troponin.

## 2015-07-20 NOTE — ED Notes (Signed)
Cardiology at bedside.

## 2015-07-20 NOTE — Progress Notes (Signed)
ANTICOAGULATION CONSULT NOTE - Initial Consult  Pharmacy Consult for heparin Indication: chest pain/ACS and atrial fibrillation  No Known Allergies  Patient Measurements: Height: 5\' 6"  (167.6 cm) Weight: 130 lb (58.968 kg) IBW/kg (Calculated) : 59.3  Vital Signs: Temp: 97.8 F (36.6 C) (04/16 0928) Temp Source: Oral (04/16 0928) BP: 136/88 mmHg (04/16 1130) Pulse Rate: 105 (04/16 1130)  Labs:  Recent Labs  07/20/15 0935  HGB 14.2  HCT 43.4  PLT 234  LABPROT 15.4*  INR 1.21  CREATININE 1.40*    Estimated Creatinine Clearance: 26.4 mL/min (by C-G formula based on Cr of 1.4).   Medical History: Past Medical History  Diagnosis Date  . Migraine   . GERD (gastroesophageal reflux disease)   . Dyslipidemia   . History of scarlet fever   . PAF (paroxysmal atrial fibrillation) ([place])   . Depression   . Melanoma ([place])   . Mitral regurgitation     a. Echo (11/15):  EF 50-55%, Gr 2 DD, MAC, prolapsing P2 segment of post MV leaflet with mod to possibly severe MR, mild LAE, normal RVF, PASP 35 mmHg;  b. Echo 5/16: Mild LVH, EF 55-60%, trivial AI, moderate MR (LVID, ES 32.3 mm), mild BAE, moderate TR, PASP 48  . Hx of cardiovascular stress test     Lexiscan Myoview (12/15): No scar, no ischemia, EF 59%; Low Risk    Assessment: 82 yo F presents on 4/16 with SOB and chest pressure. Cards consulted since troponin elevated. She is on coumadin 2.5mg  daily PTA for Afib. Last dose was on 4/15. INR on admit low at 1.21. Pharmacy consulted to switch to heparin in preparation for NSTEMI / Afib work up and cath on Monday.  Hgb and plts stable. No s/s of bleed.  Goal of Therapy:  Heparin level 0.3-0.7 units/ml Monitor platelets by anticoagulation protocol: Yes   Plan:  No heparin bolus Start heparin gtt at 850 units/hr Check 8 hr HL Monitor daily HL, CBC, s/s of bleed Cards plans to take to cath lab on 4/17 F/U coumadin restart after procedures  Elenor Quinones, PharmD,  BCPS Clinical Pharmacist Pager [phone] 07/20/2015 12:36 PM

## 2015-07-20 NOTE — ED Notes (Signed)
CRITICAL VALUE ALERT  Critical value received:  2.64 Troponin  Date of notification:  07/20/15  Time of notification:  1338  Critical value read back: Yes  Nurse who received alert:  Delon Sacramento RN  MD notified (1st page):  Admitting MD Paged  Time of first page:  1339  MD notified (2nd page):  Time of second page:  Responding MD:    Time MD responded:

## 2015-07-20 NOTE — ED Notes (Signed)
PT woke up at 0230 with SOB, chest pressure, and she was "sweaty all over." PT reports she never could go back to sleep. PT reports a history of Afib. PT denies SOB and chest pressure during triage. PT states, "I feel fine right now." PT denies any history of cardiac and lung disorders aside from Afib.

## 2015-07-21 ENCOUNTER — Encounter (HOSPITAL_COMMUNITY)
Admission: EM | Disposition: A | Discharge status: Home / Self Care | Payer: Self-pay | Source: Home / Self Care | Attending: Internal Medicine

## 2015-07-21 DIAGNOSIS — R011 Cardiac murmur, unspecified: Secondary | ICD-10-CM

## 2015-07-21 DIAGNOSIS — N183 Chronic kidney disease, stage 3 (moderate): Secondary | ICD-10-CM

## 2015-07-21 HISTORY — PX: CARDIAC CATHETERIZATION: SHX172

## 2015-07-21 LAB — BASIC METABOLIC PANEL
ANION GAP: 12 (ref 5–15)
BUN: 19 mg/dL (ref 6–20)
CHLORIDE: 100 mmol/L — AB (ref 101–111)
CO2: 27 mmol/L (ref 22–32)
CREATININE: 1.31 mg/dL — AB (ref 0.44–1.00)
Calcium: 8.8 mg/dL — ABNORMAL LOW (ref 8.9–10.3)
GFR calc non Af Amer: 35 mL/min — ABNORMAL LOW (ref >60)
GFR, EST AFRICAN AMERICAN: 41 mL/min — AB (ref >60)
Glucose, Bld: 111 mg/dL — ABNORMAL HIGH (ref 65–99)
POTASSIUM: 3.6 mmol/L (ref 3.5–5.1)
Sodium: 139 mmol/L (ref 135–145)

## 2015-07-21 LAB — CBC WITH DIFFERENTIAL/PLATELET
Basophils Absolute: 0 10*3/uL (ref 0.0–0.1)
Basophils Relative: 0 %
EOS ABS: 0.2 10*3/uL (ref 0.0–0.7)
EOS PCT: 2 %
HCT: 40.7 % (ref 36.0–46.0)
HEMOGLOBIN: 13.6 g/dL (ref 12.0–15.0)
LYMPHS ABS: 2.1 10*3/uL (ref 0.7–4.0)
LYMPHS PCT: 21 %
MCH: 30.7 pg (ref 26.0–34.0)
MCHC: 33.4 g/dL (ref 30.0–36.0)
MCV: 91.9 fL (ref 78.0–100.0)
MONOS PCT: 7 %
Monocytes Absolute: 0.7 10*3/uL (ref 0.1–1.0)
NEUTROS PCT: 70 %
Neutro Abs: 7 10*3/uL (ref 1.7–7.7)
Platelets: 224 10*3/uL (ref 150–400)
RBC: 4.43 MIL/uL (ref 3.87–5.11)
RDW: 14.4 % (ref 11.5–15.5)
WBC: 10 10*3/uL (ref 4.0–10.5)

## 2015-07-21 LAB — HEPARIN LEVEL (UNFRACTIONATED): Heparin Unfractionated: 0.78 IU/mL — ABNORMAL HIGH (ref 0.30–0.70)

## 2015-07-21 LAB — TROPONIN I: TROPONIN I: 6.61 ng/mL — AB (ref <0.031)

## 2015-07-21 SURGERY — LEFT HEART CATH AND CORONARY ANGIOGRAPHY
Anesthesia: LOCAL

## 2015-07-21 MED ORDER — ACETAMINOPHEN 325 MG PO TABS: 650.0000 mg | ORAL_TABLET | ORAL | Status: DC | PRN

## 2015-07-21 MED ORDER — ONDANSETRON HCL 4 MG/2ML IJ SOLN: 4.0000 mg | Freq: Four times a day (QID) | INTRAMUSCULAR | Status: DC | PRN

## 2015-07-21 MED ORDER — LIDOCAINE HCL (PF) 1 % IJ SOLN
INTRAMUSCULAR | Status: AC
  Filled 2015-07-21: qty 30

## 2015-07-21 MED ORDER — SODIUM CHLORIDE 0.9 % IV SOLN: 250.0000 mL | INTRAVENOUS | Status: DC | PRN

## 2015-07-21 MED ORDER — IOPAMIDOL (ISOVUE-370) INJECTION 76%
INTRAVENOUS | Status: DC | PRN
  Administered 2015-07-21: 50 mL via INTRA_ARTERIAL

## 2015-07-21 MED ORDER — IOPAMIDOL (ISOVUE-370) INJECTION 76%
INTRAVENOUS | Status: AC
  Filled 2015-07-21: qty 100

## 2015-07-21 MED ORDER — MORPHINE SULFATE (PF) 2 MG/ML IV SOLN: 2.0000 mg | INTRAVENOUS | Status: DC | PRN

## 2015-07-21 MED ORDER — WARFARIN SODIUM 2 MG PO TABS
3.0000 mg | ORAL_TABLET | Freq: Once | ORAL | Status: AC
  Administered 2015-07-21: 3 mg via ORAL
  Filled 2015-07-21: qty 1

## 2015-07-21 MED ORDER — HEPARIN (PORCINE) IN NACL 2-0.9 UNIT/ML-% IJ SOLN
INTRAMUSCULAR | Status: DC | PRN
  Administered 2015-07-21: 1500 mL

## 2015-07-21 MED ORDER — HEPARIN (PORCINE) IN NACL 2-0.9 UNIT/ML-% IJ SOLN
INTRAMUSCULAR | Status: AC
  Filled 2015-07-21: qty 1000

## 2015-07-21 MED ORDER — SODIUM CHLORIDE 0.9 % WEIGHT BASED INFUSION: 3.0000 mL/kg/h | INTRAVENOUS | Status: AC

## 2015-07-21 MED ORDER — WARFARIN - PHARMACIST DOSING INPATIENT: Freq: Every day | Status: DC

## 2015-07-21 MED ORDER — SODIUM CHLORIDE 0.9% FLUSH: 3.0000 mL | Freq: Two times a day (BID) | INTRAVENOUS | Status: DC

## 2015-07-21 MED ORDER — SODIUM CHLORIDE 0.9% FLUSH: 3.0000 mL | INTRAVENOUS | Status: DC | PRN

## 2015-07-21 MED ORDER — LIDOCAINE HCL (PF) 1 % IJ SOLN
INTRAMUSCULAR | Status: DC | PRN
  Administered 2015-07-21: 30 mL

## 2015-07-21 SURGICAL SUPPLY — 7 items
CATH INFINITI 5FR MULTPACK ANG (CATHETERS) ×2 IMPLANT
KIT HEART LEFT (KITS) ×2 IMPLANT
PACK CARDIAC CATHETERIZATION (CUSTOM PROCEDURE TRAY) ×2 IMPLANT
SHEATH PINNACLE 5F 10CM (SHEATH) ×2 IMPLANT
SYR MEDRAD MARK V 150ML (SYRINGE) ×2 IMPLANT
TRANSDUCER W/STOPCOCK (MISCELLANEOUS) ×2 IMPLANT
WIRE EMERALD 3MM-J .035X150CM (WIRE) ×2 IMPLANT

## 2015-07-21 NOTE — Interval H&P Note (Signed)
Cath Lab Visit (complete for each Cath Lab visit)  Clinical Evaluation Leading to the Procedure:   ACS: Yes.    Non-ACS:    Anginal Classification: CCS IV  Anti-ischemic medical therapy: No Therapy  Non-Invasive Test Results: No non-invasive testing performed  Prior CABG: No previous CABG      History and Physical Interval Note:  07/21/2015 1:16 PM  Nancy [name]  has presented today for surgery, with the diagnosis of unstable angina  The various methods of treatment have been discussed with the patient and family. After consideration of risks, benefits and other options for treatment, the patient has consented to  Procedure(s): Left Heart Cath and Coronary Angiography (N/A) as a surgical intervention .  The patient's history has been reviewed, patient examined, no change in status, stable for surgery.  I have reviewed the patient's chart and labs.  Questions were answered to the patient's satisfaction.     Quay Burow

## 2015-07-21 NOTE — Progress Notes (Addendum)
    Subjective:  Feels better today. No CP or dyspnea.   Objective:  Vital Signs in the last 24 hours: Temp:  [97.7 F (36.5 C)-99.1 F (37.3 C)] 99.1 F (37.3 C) (04/17 0600) Pulse Rate:  [48-111] 101 (04/16 2219) Resp:  [13-22] 21 (04/17 0600) BP: (125-161)/(75-102) 139/96 mmHg (04/17 0600) SpO2:  [96 %-99 %] 97 % (04/17 0600) Weight:  [133 lb 2.5 oz (60.4 kg)-133 lb 6.4 oz (60.51 kg)] 133 lb 2.5 oz (60.4 kg) (04/17 0600)  Intake/Output from previous day: 04/16 0701 - 04/17 0700 In: 775.6 [P.O.:510; I.V.:265.6] Out: 600 [Urine:600]  Physical Exam: Pt is alert and oriented, pleasant elderly woman in NAD HEENT: normal Neck: JVP - normal Lungs: CTA bilaterally CV: irregular, tachycardic, 2/6 diastolic murmur loudest at LSB Abd: soft, NT, Positive BS, no hepatomegaly Ext: no C/C/E, distal pulses intact and equal Skin: warm/dry no rash   Lab Results:  Recent Labs  07/20/15 0935 07/21/15 0350  WBC 9.0 10.0  HGB 14.2 13.6  PLT 234 224    Recent Labs  07/20/15 0935 07/21/15 0350  NA 138 139  K 4.0 3.6  CL 100* 100*  CO2 27 27  GLUCOSE 139* 111*  BUN 19 19  CREATININE 1.40* 1.31*    Recent Labs  07/20/15 1821 07/21/15 0009  TROPONINI 7.17* 6.61*    Tele: Atrial fibrillation  Assessment/Plan:  1. NSTEMI: CP now resolved, but significant troponin elevation and typical symptoms. For cath today. I have reviewed the risks, indications, and alternatives to cardiac catheterization, possible angioplasty, and stenting with the patient. Risks include but are not limited to bleeding, infection, vascular injury, stroke, myocardial infection, arrhythmia, kidney injury, radiation-related injury in the case of prolonged fluoroscopy use, emergency cardiac surgery, and death. The patient understands the risks of serious complication is 1-2 in [id number] with diagnostic cardiac cath and 1-2% or less with angioplasty/stenting. Might be reasonable to consider enrollment in  Leaders-Free Trial (DES with 30 days DAPT) considering need for chronic oral anticoagulation and advanced age.   2. Chronic atrial fibrillation: rate-controlled. On warfarin. Need to consider if she requires PCI.  3. CKD III: stable with creatinine 1.31 today.  4. Diastolic murmur: difficult in setting of AF with elevated HR at time of my exam, but needs echo to evaluate for AI/significant valvular disease.  Sherren Mocha, M.D. 07/21/2015, 12:11 PM

## 2015-07-21 NOTE — Progress Notes (Signed)
ANTICOAGULATION CONSULT NOTE - Initial Consult  Pharmacy Consult for coumadin Indication: Afib  Not on File  Patient Measurements: Height: 5\' 6"  (167.6 cm) Weight: 133 lb 2.5 oz (60.4 kg) IBW/kg (Calculated) : 59.3  Vital Signs: Temp: 99.1 F (37.3 C) (04/17 0600) Temp Source: Oral (04/17 0600) BP: 106/68 mmHg (04/17 1628) Pulse Rate: 40 (04/17 1534)  Labs:  Recent Labs  07/20/15 0935 07/20/15 1244 07/20/15 1821 07/20/15 2121 07/21/15 0009 07/21/15 0350  HGB 14.2  --   --   --   --  13.6  HCT 43.4  --   --   --   --  40.7  PLT 234  --   --   --   --  224  LABPROT 15.4*  --   --   --   --   --   INR 1.21  --   --   --   --   --   HEPARINUNFRC  --   --   --  0.68  --  0.78*  CREATININE 1.40*  --   --   --   --  1.31*  TROPONINI  --  2.64* 7.17*  --  6.61*  --     Estimated Creatinine Clearance: 28.3 mL/min (by C-G formula based on Cr of 1.31).   Medical History: Past Medical History  Diagnosis Date  . Migraine   . GERD (gastroesophageal reflux disease)   . Dyslipidemia   . History of scarlet fever   . PAF (paroxysmal atrial fibrillation) ([place])   . Depression   . Melanoma ([place])   . Mitral regurgitation     a. Echo (11/15):  EF 50-55%, Gr 2 DD, MAC, prolapsing P2 segment of post MV leaflet with mod to possibly severe MR, mild LAE, normal RVF, PASP 35 mmHg;  b. Echo 5/16: Mild LVH, EF 55-60%, trivial AI, moderate MR (LVID, ES 32.3 mm), mild BAE, moderate TR, PASP 48  . Hx of cardiovascular stress test     Lexiscan Myoview (12/15): No scar, no ischemia, EF 59%; Low Risk    Assessment: 82 yo F presents on 4/16 with SOB and chest pressure. Cards consulted since troponin elevated. She is on coumadin 2.5mg  daily PTA for Afib. Last dose was on 4/15. INR on admit low at 1.21. Pharmacy consulted to switch to heparin in preparation for NSTEMI / Afib work up. Cath showed mild left ventricular systolic dysfunction. To restart PTA coumadin per Dr. Gwenlyn Found. Hgb and plts stable.  No s/s of bleed.  Goal of Therapy:  Heparin level 0.3-0.7 units/ml Monitor platelets by anticoagulation protocol: Yes   Plan:  Heparin stopped s/p cath Restart coumadin 3mg  PO x 1 tonight Monitor daily INR, CBC, s/s of bleed  Elenor Quinones, PharmD, BCPS Clinical Pharmacist Pager [phone] 07/21/2015 5:02 PM

## 2015-07-21 NOTE — H&P (View-Only) (Signed)
    Subjective:  Feels better today. No CP or dyspnea.   Objective:  Vital Signs in the last 24 hours: Temp:  [97.7 F (36.5 C)-99.1 F (37.3 C)] 99.1 F (37.3 C) (04/17 0600) Pulse Rate:  [48-111] 101 (04/16 2219) Resp:  [13-22] 21 (04/17 0600) BP: (125-161)/(75-102) 139/96 mmHg (04/17 0600) SpO2:  [96 %-99 %] 97 % (04/17 0600) Weight:  [133 lb 2.5 oz (60.4 kg)-133 lb 6.4 oz (60.51 kg)] 133 lb 2.5 oz (60.4 kg) (04/17 0600)  Intake/Output from previous day: 04/16 0701 - 04/17 0700 In: 775.6 [P.O.:510; I.V.:265.6] Out: 600 [Urine:600]  Physical Exam: Pt is alert and oriented, pleasant elderly woman in NAD HEENT: normal Neck: JVP - normal Lungs: CTA bilaterally CV: irregular, tachycardic, 2/6 diastolic murmur loudest at LSB Abd: soft, NT, Positive BS, no hepatomegaly Ext: no C/C/E, distal pulses intact and equal Skin: warm/dry no rash   Lab Results:  Recent Labs  07/20/15 0935 07/21/15 0350  WBC 9.0 10.0  HGB 14.2 13.6  PLT 234 224    Recent Labs  07/20/15 0935 07/21/15 0350  NA 138 139  K 4.0 3.6  CL 100* 100*  CO2 27 27  GLUCOSE 139* 111*  BUN 19 19  CREATININE 1.40* 1.31*    Recent Labs  07/20/15 1821 07/21/15 0009  TROPONINI 7.17* 6.61*    Tele: Atrial fibrillation  Assessment/Plan:  1. NSTEMI: CP now resolved, but significant troponin elevation and typical symptoms. For cath today. I have reviewed the risks, indications, and alternatives to cardiac catheterization, possible angioplasty, and stenting with the patient. Risks include but are not limited to bleeding, infection, vascular injury, stroke, myocardial infection, arrhythmia, kidney injury, radiation-related injury in the case of prolonged fluoroscopy use, emergency cardiac surgery, and death. The patient understands the risks of serious complication is 1-2 in [id number] with diagnostic cardiac cath and 1-2% or less with angioplasty/stenting. Might be reasonable to consider enrollment in  Leaders-Free Trial (DES with 30 days DAPT) considering need for chronic oral anticoagulation and advanced age.   2. Chronic atrial fibrillation: rate-controlled. On warfarin. Need to consider if she requires PCI.  3. CKD III: stable with creatinine 1.31 today.  4. Diastolic murmur: difficult in setting of AF with elevated HR at time of my exam, but needs echo to evaluate for AI/significant valvular disease.  Sherren Mocha, M.D. 07/21/2015, 12:11 PM

## 2015-07-21 NOTE — Progress Notes (Signed)
ANTICOAGULATION CONSULT NOTE - Follow Up Consult  Pharmacy Consult for Heparin  Indication: chest pain/ACS and atrial fibrillation  No Known Allergies  Patient Measurements: Height: 5\' 6"  (167.6 cm) Weight: 133 lb 6.4 oz (60.51 kg) IBW/kg (Calculated) : 59.3  Vital Signs: Temp: 98.4 F (36.9 C) (04/16 2128) Temp Source: Oral (04/16 2128) BP: 150/93 mmHg (04/16 2219) Pulse Rate: 101 (04/16 2219)  Labs:  Recent Labs  07/20/15 0935 07/20/15 1244 07/20/15 1821 07/20/15 2121 07/21/15 0009 07/21/15 0350  HGB 14.2  --   --   --   --  13.6  HCT 43.4  --   --   --   --  40.7  PLT 234  --   --   --   --  224  LABPROT 15.4*  --   --   --   --   --   INR 1.21  --   --   --   --   --   HEPARINUNFRC  --   --   --  0.68  --  0.78*  CREATININE 1.40*  --   --   --   --  1.31*  TROPONINI  --  2.64* 7.17*  --  6.61*  --     Estimated Creatinine Clearance: 28.3 mL/min (by C-G formula based on Cr of 1.31).  Assessment: Heparin level supra-therapeutic this AM, cath today  Goal of Therapy:  Heparin level 0.3-0.7 units/ml Monitor platelets by anticoagulation protocol: Yes   Plan:  -Decrease heparin drip to 750 units/hr -F/U post-cath -Schedule heparin level if cath is postponed   Narda Bonds 07/21/2015,5:20 AM

## 2015-07-21 NOTE — Progress Notes (Signed)
Site area: RT GROIN  Site Prior to Removal:  Level 0 Pressure Applied For:20 MINUTES Manual:   YES Patient Status During Pull:  AWAKE Post Pull Site:  [employer] Instructions Given:  YES Post Pull Pulses Present: RT DP PAPABLE Dressing Applied:  YES Bedrest begins @ 14:30 Comments:

## 2015-07-22 ENCOUNTER — Encounter (HOSPITAL_COMMUNITY): Payer: Self-pay | Admitting: Cardiovascular Disease

## 2015-07-22 ENCOUNTER — Ambulatory Visit (HOSPITAL_COMMUNITY): Payer: Medicare Other

## 2015-07-22 DIAGNOSIS — R079 Chest pain, unspecified: Secondary | ICD-10-CM

## 2015-07-22 LAB — BASIC METABOLIC PANEL
Anion gap: 10 (ref 5–15)
BUN: 13 mg/dL (ref 6–20)
CHLORIDE: 105 mmol/L (ref 101–111)
CO2: 24 mmol/L (ref 22–32)
Calcium: 8.5 mg/dL — ABNORMAL LOW (ref 8.9–10.3)
Creatinine, Ser: 1.25 mg/dL — ABNORMAL HIGH (ref 0.44–1.00)
GFR calc non Af Amer: 38 mL/min — ABNORMAL LOW (ref >60)
GFR, EST AFRICAN AMERICAN: 44 mL/min — AB (ref >60)
GLUCOSE: 99 mg/dL (ref 65–99)
Potassium: 4 mmol/L (ref 3.5–5.1)
Sodium: 139 mmol/L (ref 135–145)

## 2015-07-22 LAB — CBC
HEMATOCRIT: 37.8 % (ref 36.0–46.0)
HEMOGLOBIN: 12.4 g/dL (ref 12.0–15.0)
MCH: 30.7 pg (ref 26.0–34.0)
MCHC: 32.8 g/dL (ref 30.0–36.0)
MCV: 93.6 fL (ref 78.0–100.0)
Platelets: 197 10*3/uL (ref 150–400)
RBC: 4.04 MIL/uL (ref 3.87–5.11)
RDW: 14.8 % (ref 11.5–15.5)
WBC: 9.3 10*3/uL (ref 4.0–10.5)

## 2015-07-22 LAB — PROTIME-INR
INR: 1.28 (ref 0.00–1.49)
Prothrombin Time: 16.2 seconds — ABNORMAL HIGH (ref 11.6–15.2)

## 2015-07-22 LAB — ECHOCARDIOGRAM COMPLETE
Height: 66 in
Weight: 2068.8 oz

## 2015-07-22 MED ORDER — METOPROLOL TARTRATE 25 MG PO TABS
37.5000 mg | ORAL_TABLET | Freq: Two times a day (BID) | ORAL | Status: DC
  Administered 2015-07-22: 37.5 mg via ORAL
  Filled 2015-07-22: qty 1

## 2015-07-22 MED ORDER — ROSUVASTATIN CALCIUM 40 MG PO TABS: 40.0000 mg | ORAL_TABLET | Freq: Every day | ORAL | Status: DC

## 2015-07-22 MED ORDER — METOPROLOL TARTRATE 37.5 MG PO TABS: 37.5000 mg | ORAL_TABLET | Freq: Two times a day (BID) | ORAL | Status: DC

## 2015-07-22 MED ORDER — ASPIRIN 81 MG PO TBEC: 81.0000 mg | DELAYED_RELEASE_TABLET | Freq: Every day | ORAL | Status: DC

## 2015-07-22 MED ORDER — NITROGLYCERIN 0.4 MG SL SUBL: 0.4000 mg | SUBLINGUAL_TABLET | SUBLINGUAL | Status: AC | PRN

## 2015-07-22 MED ORDER — WARFARIN SODIUM 5 MG PO TABS: ORAL_TABLET | ORAL | Status: DC

## 2015-07-22 NOTE — Progress Notes (Addendum)
    Subjective:  Pt feels ok. No CP or dyspnea.   Objective:  Vital Signs in the last 24 hours: Temp:  [98.1 F (36.7 C)-99.3 F (37.4 C)] 99.3 F (37.4 C) (04/18 0543) Pulse Rate:  [40-131] 80 (04/17 2130) Resp:  [15-29] 20 (04/17 2110) BP: (104-141)/(68-100) 117/78 mmHg (04/18 0543) SpO2:  [85 %-99 %] 96 % (04/18 0543) Weight:  [129 lb 4.8 oz (58.65 kg)] 129 lb 4.8 oz (58.65 kg) (04/18 0543)  Intake/Output from previous day: 04/17 0701 - 04/18 0700 In: 240 [P.O.:240] Out: 650 [Urine:650]  Physical Exam: Pt is alert and oriented, pleasant elderly woman in NAD HEENT: normal Neck: JVP - normal Lungs: CTA bilaterally CV: Irregularly irregular with grade 2/6 diastolic decrescendo murmur at the left sternal border Abd: soft, NT, Positive BS, no hepatomegaly Ext: no C/C/E, distal pulses intact and equal, right groin site clear Skin: warm/dry no rash   Lab Results:  Recent Labs  07/21/15 0350 07/22/15 0421  WBC 10.0 9.3  HGB 13.6 12.4  PLT 224 197    Recent Labs  07/21/15 0350 07/22/15 0421  NA 139 139  K 3.6 4.0  CL 100* 105  CO2 27 24  GLUCOSE 111* 99  BUN 19 13  CREATININE 1.31* 1.25*    Recent Labs  07/20/15 1821 07/21/15 0009  TROPONINI 7.17* 6.61*    Cardiac Studies: Cardiac catheterization data reviewed  Tele: Atrial fibrillation  Assessment/Plan:  1. Non-STEMI: Cardiac cath study demonstrated no obstructive coronary artery disease. Differential diagnosis includes coronary vasospasm versus embolic event versus non-coronary event such as myocarditis. I suspect coronary embolism is most likely etiology in this agent with atrial fibrillation. Will continue with warfarin anticoagulation, aspirin 81 mg daily, and post MI medical therapy. Her right groin site is stable, she is chest pain-free, and I think she can be discharged home later today. Continue rosuvastatin and metoprolol.  2. Chronic atrial fibrillation: Coumadin has been restarted. Heart  rate is a little elevated. Will increase metoprolol to 37.5 mg twice a day.  3. Chronic kidney disease stage III: Creatinine stable post cardiac catheterization  4. Diastolic heart murmur: Suspect aortic insufficiency. Recommend 2-D echocardiogram. This was ordered yesterday.  Disposition: Home today after her echo study.  Sherren Mocha, M.D. 07/22/2015, 7:42 AM

## 2015-07-22 NOTE — Progress Notes (Signed)
  Echocardiogram 2D Echocardiogram has been performed.  Nancy [name] 07/22/2015, 3:34 PM

## 2015-07-22 NOTE — Discharge Summary (Signed)
Discharge Summary    Patient ID: Nancy [name],  MRN: [id number], DOB/AGE: [birth year] 82 y.o.  Admit date: 07/20/2015 Discharge date: 07/22/2015  Primary Care Provider: [name] Primary Cardiologist: Dr. Rayann Heman  Discharge Diagnoses    Principal Problem:   NSTEMI (non-ST elevated myocardial infarction) [facility]) Active Problems:   HLD (hyperlipidemia)   Atrial fibrillation (HCC)   Mitral regurgitation   Chronic diastolic CHF (congestive heart failure) (HCC)   Elevated troponin   Chest pain with high risk of acute coronary syndrome   History of Present Illness     Nancy [name] is a 82 y.o. female with past medical history of PAF (on Coumadin), moderate MR, HLD, TIA's, Stage 3 CKD, and Chronic Diastolic CHF (EF 0000000 by echo in 08/2014) who presented to [facility] ED on 07/20/2015 for evaluation of chest pain.  Reported waking up in the middle of the night with chest pressure and acute dyspnea. She was diaphoretic at that time as well and reported her pain resolved after one hour. Upon arrival to the ED, she reported her symptoms have resolved.  Her initial troponin was 0.72 and EKG showed no acute ischemic changes. Her INR was sub-threpauetic at 1.21 on admission. She was admitted, started on IV Heparin, with anticipation of a cardiac catheterization the following day.   Hospital Course     Consultants: None  The following morning, she denied any repeat chest pain or shortness of breath. Her troponin peaked at 7.17. Cardiac catheterization showed normal coronary arteries which made the diagnosis of coronary vasospasm more likely. She tolerated the procedure well and no complications were noted.  Over admission, a diastolic murmur was noted and a repeat echocardiogram was recommended. This was performed on 07/22/2015 and showed an EF of 45-50% with Grade 3 DD and moderate mitral valve prolapse with moderate to severe MR. This will need to be followed on an outpatient  basis.  She denied any repeat episodes of chest pain or palpitations. Her HR remained slightly elevated into the 120's, therefore her Lopressor was increased to 37.5mg  BID. Her INR had been sub-therapeutic at time of admission and was st 1.28 on 07/22/2015. Pharmacy assisted with her Coumadin dosing at time of discharge and recommended taking 5mg  daily for the next 3 days followed by her PTA dosing of 2.5mg  until her next INR check next week (performed at her PCP's office). She was last examined by Dr. Burt Knack and deemed stable for discharge. Two week Cardiology follow-up has been arranged.   Discharge Vitals Blood pressure 122/89, pulse 65, temperature 98.3 F (36.8 C), temperature source Oral, resp. rate 20, height 5\' 6"  (1.676 m), weight 129 lb 4.8 oz (58.65 kg), SpO2 97 %.  Filed Weights   07/20/15 1602 07/21/15 0600 07/22/15 0543  Weight: 133 lb 6.4 oz (60.51 kg) 133 lb 2.5 oz (60.4 kg) 129 lb 4.8 oz (58.65 kg)    Labs & Radiologic Studies     CBC  Recent Labs  07/20/15 0935 07/21/15 0350 07/22/15 0421  WBC 9.0 10.0 9.3  NEUTROABS 6.9 7.0  --   HGB 14.2 13.6 12.4  HCT 43.4 40.7 37.8  MCV 93.1 91.9 93.6  PLT 234 224 [id number]   Basic Metabolic Panel  Recent Labs  07/21/15 0350 07/22/15 0421  NA 139 139  K 3.6 4.0  CL 100* 105  CO2 27 24  GLUCOSE 111* 99  BUN 19 13  CREATININE 1.31* 1.25*  CALCIUM 8.8* 8.5*   Liver Function Tests  Recent Labs  07/20/15 0935  AST 52*  ALT 40  ALKPHOS 80  BILITOT 1.2  PROT 7.5  ALBUMIN 3.6   No results for input(s): LIPASE, AMYLASE in the last 72 hours. Cardiac Enzymes  Recent Labs  07/20/15 1244 07/20/15 1821 07/21/15 0009  TROPONINI 2.64* 7.17* 6.61*   Dg Chest Port 1 View: 07/20/2015  CLINICAL DATA:  Shortness of breath EXAM: PORTABLE CHEST 1 VIEW COMPARISON:  08/27/2014 chest radiograph. FINDINGS: Stable cardiomediastinal silhouette with top-normal heart size. No pneumothorax. No pleural effusion. Lungs appear clear,  with no acute consolidative airspace disease and no pulmonary edema. IMPRESSION: No active disease. Electronically Signed   By: Ilona Sorrel M.D.   On: 07/20/2015 10:42   Diagnostic Studies/Procedures     Cardiac Catheterization: 07/21/2015  Left Ventricle The left ventricular size is normal. There is mild left ventricular systolic dysfunction. Small area of apical dyskinesia  IMPRESSION:Ms Botting has abnormal coronary arteries and normal LV function with a small area of apical dyskinesia. His may have been embolic versus spasm. Continue medical therapy will be recommended The sheath was removed and pressure held on the groin to achieve hemostasis.The patient left the lab in stable condition.She'll be started back on her home Coumadin dose.  Quay Burow. MD, [facility] 07/21/2015 1:54 PM  Echocardiogram: 07/22/2015  Study Conclusions - Procedure narrative: Transthoracic echocardiography. Image  quality was poor. The study was technically difficult, as a  result of poor sound wave transmission. - Left ventricle: The cavity size was normal. Wall thickness was  increased in a pattern of mild LVH. Systolic function was mildly  reduced. The estimated ejection fraction was in the range of 45%  to 50%. Diffuse hypokinesis. Doppler parameters are consistent  with a reversible restrictive pattern, indicative of decreased  left ventricular diastolic compliance and/or increased left  atrial pressure (grade 3 diastolic dysfunction). - Aortic valve: Trileaflet; mildly thickened, mildly calcified  leaflets. There was trivial regurgitation. - Mitral valve: Moderate prolapse, involving the posterior leaflet.  There was moderate to severe regurgitation. - Right atrium: The atrium was mildly dilated. - Tricuspid valve: There was severe regurgitation. - Pulmonic valve: There was moderate regurgitation. - Pulmonary arteries: Systolic pressure was moderately increased.  PA peak pressure: 56 mm  Hg (S).  Disposition   Pt is being discharged home today in good condition.  Follow-up Plans & Appointments    Follow-up Information    Follow up with Ermalinda Barrios, PA-C On 08/11/2015.   Specialty:  Cardiology   Why:  Cardiology Hospital Follow-Up on 08/11/2015 at 11:00AM.   Contact information:   [place] [id number] [phone]      Discharge Instructions    Diet - low sodium heart healthy    Complete by:  As directed      Discharge instructions    Complete by:  As directed   [place].  PLEASE ATTEND ALL SCHEDULED FOLLOW-UP APPOINTMENTS.   Activity: Increase activity slowly as tolerated. You may shower, but no soaking baths (or swimming) for 1 week. No driving for 24 hours. No lifting over 5 lbs for 1 week. No sexual activity for 1 week.   You May Return to Work: in 1 week (if applicable)  Wound Care: You may wash cath site gently with soap and water. Keep cath site clean and dry. If you notice pain, swelling, bleeding or pus at your cath site,  please call [phone].     Increase activity slowly    Complete by:  As directed            Discharge Medications   Discharge Medication List as of 07/22/2015  5:27 PM    START taking these medications   Details  aspirin EC 81 MG EC tablet Take 1 tablet (81 mg total) by mouth daily., Starting 07/22/2015, Until Discontinued, No Print    nitroGLYCERIN (NITROSTAT) 0.4 MG SL tablet Place 1 tablet (0.4 mg total) under the tongue every 5 (five) minutes x 3 doses as needed for chest pain., Starting 07/22/2015, Until Discontinued, Normal    rosuvastatin (CRESTOR) 40 MG tablet Take 1 tablet (40 mg total) by mouth daily at 6 PM., Starting 07/22/2015, Until Discontinued, Normal      CONTINUE these medications which have CHANGED   Details  metoprolol tartrate 37.5 MG TABS Take 37.5 mg by mouth 2 (two) times daily., Starting  07/22/2015, Until Discontinued, Normal    warfarin (COUMADIN) 5 MG tablet Take as directed by the [facility]. Will need to take 5mg  (1 tablet) starting tonight for the next 3 days following by 2.5mg  (0.5 tablet) daily until next Coumadin Check., No Print      CONTINUE these medications which have NOT CHANGED   Details  calcium-vitamin D (OSCAL WITH D 500-200) 500-200 MG-UNIT per tablet Take 1 tablet by mouth daily.  , Until Discontinued, Historical Med    Coenzyme Q10 (CO Q 10 PO) Take 100 mg by mouth daily. , Until Discontinued, Historical Med    furosemide (LASIX) 20 MG tablet TAKE 1 TABLET (20 MG TOTAL) BY MOUTH DAILY., Normal    Multiple Vitamins-Minerals (CENTRUM SILVER PO) Take 1 tablet by mouth daily., Until Discontinued, Historical Med    terbinafine (LAMISIL) 250 MG tablet Take 250 mg by mouth daily. Starting 06-05-15 for 6 weeks, Starting 06/05/2015, Until Discontinued, Historical Med    vitamin B-12 (CYANOCOBALAMIN) 100 MCG tablet Take 100 mcg by mouth daily., Until Discontinued, Historical Med         Aspirin prescribed at discharge?  Yes High Intensity Statin Prescribed? (Lipitor 40-80mg  or Crestor 20-40mg ): Yes Beta Blocker Prescribed? Yes For EF 45% or less, Was ACEI/ARB Prescribed? No: Read as 45-50%. Could consider as outpatient ADP Receptor Inhibitor Prescribed? (i.e. Plavix etc.-Includes Medically Managed Patients): No: Coronary Vasospasm For EF <40%, Aldosterone Inhibitor Prescribed? No: N/A Was EF assessed during THIS hospitalization? Yes Was Cardiac Rehab II ordered? (Included Medically managed Patients): No: Coronary Vasospasm   Allergies Not on File   Outstanding Labs/Studies   None  Duration of Discharge Encounter   Greater than 30 minutes including physician time.  Signed, Erma Heritage, PA-C 07/22/2015, 8:58 PM

## 2015-07-22 NOTE — Discharge Instructions (Signed)

## 2015-07-28 DIAGNOSIS — R7309 Other abnormal glucose: Secondary | ICD-10-CM | POA: Diagnosis not present

## 2015-07-28 DIAGNOSIS — E784 Other hyperlipidemia: Secondary | ICD-10-CM | POA: Diagnosis not present

## 2015-07-28 DIAGNOSIS — E559 Vitamin D deficiency, unspecified: Secondary | ICD-10-CM | POA: Diagnosis not present

## 2015-07-28 DIAGNOSIS — E041 Nontoxic single thyroid nodule: Secondary | ICD-10-CM | POA: Diagnosis not present

## 2015-07-28 DIAGNOSIS — N183 Chronic kidney disease, stage 3 (moderate): Secondary | ICD-10-CM | POA: Diagnosis not present

## 2015-07-30 DIAGNOSIS — S92355D Nondisplaced fracture of fifth metatarsal bone, left foot, subsequent encounter for fracture with routine healing: Secondary | ICD-10-CM | POA: Diagnosis not present

## 2015-08-03 ENCOUNTER — Other Ambulatory Visit: Payer: Self-pay | Admitting: Nurse Practitioner

## 2015-08-04 DIAGNOSIS — J984 Other disorders of lung: Secondary | ICD-10-CM | POA: Diagnosis not present

## 2015-08-04 DIAGNOSIS — I272 Other secondary pulmonary hypertension: Secondary | ICD-10-CM | POA: Diagnosis not present

## 2015-08-04 DIAGNOSIS — I509 Heart failure, unspecified: Secondary | ICD-10-CM | POA: Diagnosis not present

## 2015-08-04 DIAGNOSIS — Z1389 Encounter for screening for other disorder: Secondary | ICD-10-CM | POA: Diagnosis not present

## 2015-08-04 DIAGNOSIS — I480 Paroxysmal atrial fibrillation: Secondary | ICD-10-CM | POA: Diagnosis not present

## 2015-08-04 DIAGNOSIS — Z6821 Body mass index (BMI) 21.0-21.9, adult: Secondary | ICD-10-CM | POA: Diagnosis not present

## 2015-08-04 DIAGNOSIS — Z7901 Long term (current) use of anticoagulants: Secondary | ICD-10-CM | POA: Diagnosis not present

## 2015-08-04 DIAGNOSIS — Z0000 Encounter for general adult medical examination without abnormal findings: Secondary | ICD-10-CM | POA: Diagnosis not present

## 2015-08-04 DIAGNOSIS — R159 Full incontinence of feces: Secondary | ICD-10-CM | POA: Diagnosis not present

## 2015-08-06 ENCOUNTER — Other Ambulatory Visit: Payer: Self-pay | Admitting: Internal Medicine

## 2015-08-06 DIAGNOSIS — E041 Nontoxic single thyroid nodule: Secondary | ICD-10-CM

## 2015-08-11 ENCOUNTER — Encounter: Payer: Self-pay | Admitting: Physician Assistant

## 2015-08-11 ENCOUNTER — Ambulatory Visit (INDEPENDENT_AMBULATORY_CARE_PROVIDER_SITE_OTHER): Payer: Medicare Other | Admitting: Physician Assistant

## 2015-08-11 VITALS — BP 120/70 | HR 78 | Ht 66.5 in | Wt 132.1 lb

## 2015-08-11 DIAGNOSIS — I5032 Chronic diastolic (congestive) heart failure: Secondary | ICD-10-CM

## 2015-08-11 DIAGNOSIS — I482 Chronic atrial fibrillation, unspecified: Secondary | ICD-10-CM

## 2015-08-11 DIAGNOSIS — I214 Non-ST elevation (NSTEMI) myocardial infarction: Secondary | ICD-10-CM | POA: Diagnosis not present

## 2015-08-11 DIAGNOSIS — I340 Nonrheumatic mitral (valve) insufficiency: Secondary | ICD-10-CM

## 2015-08-11 DIAGNOSIS — Z7901 Long term (current) use of anticoagulants: Secondary | ICD-10-CM | POA: Insufficient documentation

## 2015-08-11 MED ORDER — ROSUVASTATIN CALCIUM 40 MG PO TABS: 40.0000 mg | ORAL_TABLET | Freq: Every day | ORAL | Status: DC

## 2015-08-11 NOTE — Progress Notes (Signed)
Cardiology Office Note    Date:  08/11/2015   ID:  Nancy [name], DOB [birth year], MRN [id number]  PCP:  [name], MD  Cardiologist:  Dr. Rayann Heman   Chief complaint post hospital follow-up  History of Present Illness:  Nancy [name] is a 82 y.o. female with history of PAF on Coumadin, moderate MR, HLD, TIAs, stage III CKD, and chronic diastolic heart failure who presented to  07/20/15 with a NSTEMI. 2-D echo on 07/22/15 EF 45-50% with grade 3 DD and moderate mitral valve prolapse with moderate to severe MR. Cardiac catheterization showed normal coronary arteries w with a very small area of dyskinesis which made the diagnosis of vasospasm more likely. She was tachycardic and Lopressor was increased to 37.5 mg twice a day.  Patient comes in today not feeling herself. She is moving from a home to assisted living and lost her medicines for 5 days. She has not taken any of them and never increased the metoprolol as advised in the hospital. She never got her Crestor filled either. She says she gets short of breath with little activity and has had some dizziness. She denies any chest pain, palpitations or presyncope. She denies edema.  Past Medical History  Diagnosis Date  . Migraine   . GERD (gastroesophageal reflux disease)   . Dyslipidemia   . History of scarlet fever   . PAF (paroxysmal atrial fibrillation) ([place])     a. On Coumadin  . Depression   . Melanoma ([place])   . Mitral regurgitation     a. Echo (11/15):  EF 50-55%, Gr 2 DD, MAC, prolapsing P2 segment of post MV leaflet with mod to possibly severe MR, mild LAE, normal RVF, PASP 35 mmHg;  b. Echo 5/16: Mild LVH, EF 55-60%, trivial AI, moderate MR (LVID, ES 32.3 mm), mild BAE, moderate TR, PASP 48 c. Echo 07/2015:EF 45-50%, moderate to severe MR   . NSTEMI (non-ST elevated myocardial infarction) ([place]) 07/2015    a. Troponin peak of 7.17. Cath w/ normal cors, thought to be 2ry to vasospasm  . CKD (chronic kidney disease),  stage III     Past Surgical History  Procedure Laterality Date  . Tonsillectomy    . Appendectomy    . Cataract extraction      bilateral  . Cesarean section    . Cardiac catheterization N/A 07/21/2015    Procedure: Left Heart Cath and Coronary Angiography;  Surgeon: Lorretta Harp, MD;  Location: [facility];  Service: Cardiovascular;  Laterality: N/A;    Current Medications: Outpatient Prescriptions Prior to Visit  Medication Sig Dispense Refill  . aspirin EC 81 MG EC tablet Take 1 tablet (81 mg total) by mouth daily.    . calcium-vitamin D (OSCAL WITH D 500-200) 500-200 MG-UNIT per tablet Take 1 tablet by mouth daily.      . Coenzyme Q10 (CO Q 10 PO) Take 100 mg by mouth daily.     . furosemide (LASIX) 20 MG tablet TAKE 1 TABLET BY MOUTH EVERY DAY 30 tablet 6  . Multiple Vitamins-Minerals (CENTRUM SILVER PO) Take 1 tablet by mouth daily.    . nitroGLYCERIN (NITROSTAT) 0.4 MG SL tablet Place 1 tablet (0.4 mg total) under the tongue every 5 (five) minutes x 3 doses as needed for chest pain. 25 tablet 3  . vitamin B-12 (CYANOCOBALAMIN) 100 MCG tablet Take 100 mcg by mouth daily.    Marland Kitchen warfarin (COUMADIN) 5 MG tablet Take as directed by the  Coumadin Clinic. Will need to take 5mg  (1 tablet) starting tonight for the next 3 days following by 2.5mg  (0.5 tablet) daily until next Coumadin Check. 50 tablet 1  . rosuvastatin (CRESTOR) 40 MG tablet Take 1 tablet (40 mg total) by mouth daily at 6 PM. 30 tablet 6  . metoprolol tartrate 37.5 MG TABS Take 37.5 mg by mouth 2 (two) times daily. (Patient not taking: Reported on 08/11/2015) 60 tablet 6  . terbinafine (LAMISIL) 250 MG tablet Take 250 mg by mouth daily. Reported on 08/11/2015  0   No facility-administered medications prior to visit.     Allergies:   Review of patient's allergies indicates not on file.   Social History   Social History  . Marital Status: Married    Spouse Name: N/A  . Number of Children: N/A  . Years of  Education: N/A   Social History Main Topics  . Smoking status: Former Smoker -- 6 years    Types: Cigarettes    Quit date: 09/09/1967  . Smokeless tobacco: None     Comment: pt reports she smoked socially  . Alcohol Use: 0.6 oz/week    1 Glasses of wine per week  . Drug Use: No  . Sexual Activity: Not Asked   Other Topics Concern  . None   Social History Narrative     Family History:  The patient's    family history is negative for Heart attack, Stroke, and Hypertension.   ROS:   Please see the history of present illness.    Review of Systems  Constitution: Negative.  HENT: Negative.   Cardiovascular: Positive for dyspnea on exertion.  Respiratory: Negative.   Hematologic/Lymphatic: Negative.   Musculoskeletal: Negative.  Negative for joint pain.  Gastrointestinal: Negative.   Genitourinary: Negative.   Neurological: Positive for dizziness.   All other systems reviewed and are negative.   PHYSICAL EXAM:   VS:  BP 120/70 mmHg  Pulse 78  Ht 5' 6.5" (1.689 m)  Wt 132 lb 1.9 oz (59.929 kg)  BMI 21.01 kg/m2  SpO2 98%   GEN: Well nourished, well developed, in no acute distress Neck: no JVD, carotid bruits, or masses Cardiac: Irregular irregular with 2/6 systolic murmur at the apex, no rubs, or gallops,no edema, right coronary cath site without hematoma or hemorrhage good distal pulses  Respiratory:  clear to auscultation bilaterally, normal work of breathing GI: soft, nontender, nondistended, + BS Nancy: no deformity or atrophy Skin: warm and dry, no rash Neuro:  Alert and Oriented x 3, Strength and sensation are intact Psych: euthymic mood, full affect  Wt Readings from Last 3 Encounters:  08/11/15 132 lb 1.9 oz (59.929 kg)  07/22/15 129 lb 4.8 oz (58.65 kg)  05/29/15 130 lb 3.2 oz (59.058 kg)      Studies/Labs Reviewed:   EKG:  EKG is ordered today.  The ekg ordered today demonstrates Atrial fibrillation at 78 bpm with PVC nonspecific ST-T wave changes  throughout  Recent Labs: 07/20/2015: ALT 40 07/22/2015: BUN 13; Creatinine, Ser 1.25*; Hemoglobin 12.4; Platelets 197; Potassium 4.0; Sodium 139   Lipid Panel No results found for: CHOL, TRIG, HDL, CHOLHDL, VLDL, LDLCALC, LDLDIRECT  Additional studies/ records that were reviewed today include:   Cardiac Catheterization: 07/21/2015  Left Ventricle The left ventricular size is normal. There is mild left ventricular systolic dysfunction. Small area of apical dyskinesia  IMPRESSION:Nancy [name] has abnormal coronary arteries and normal LV function with a small area of apical dyskinesia. His may  have been embolic versus spasm. Continue medical therapy will be recommended The sheath was removed and pressure held on the groin to achieve hemostasis.The patient left the lab in stable condition.She'll be started back on her home Coumadin dose.  Quay Burow. MD, [facility] 07/21/2015 1:54 PM  Echocardiogram: 07/22/2015  Study Conclusions - Procedure narrative: Transthoracic echocardiography. Image   quality was poor. The study was technically difficult, as a   result of poor sound wave transmission. - Left ventricle: The cavity size was normal. Wall thickness was   increased in a pattern of mild LVH. Systolic function was mildly   reduced. The estimated ejection fraction was in the range of 45%   to 50%. Diffuse hypokinesis. Doppler parameters are consistent   with a reversible restrictive pattern, indicative of decreased   left ventricular diastolic compliance and/or increased left   atrial pressure (grade 3 diastolic dysfunction). - Aortic valve: Trileaflet; mildly thickened, mildly calcified   leaflets. There was trivial regurgitation. - Mitral valve: Moderate prolapse, involving the posterior leaflet.   There was moderate to severe regurgitation. - Right atrium: The atrium was mildly dilated. - Tricuspid valve: There was severe regurgitation. - Pulmonic valve: There was moderate  regurgitation. - Pulmonary arteries: Systolic pressure was moderately increased.   PA peak pressure: 56 mm Hg (S).       ASSESSMENT:    1. Non-STEMI (non-ST elevated myocardial infarction) ([place])   2. Mitral regurgitation   3. Chronic diastolic CHF (congestive heart failure) ([place])   4. Chronic atrial fibrillation (H[facility])   5. Chronic anticoagulation      PLAN:  In order of problems listed above: Non-STEMI patient had normal coronary arteries and elevated troponins were felt secondary to vasospasm. Patient was tachycardic and metoprolol was increased but she never did this and actually has been off her medications for 5 days. She found them today and took them. Compliance is key for her. She's had no further chest pain. Follow-up with Dr. Rayann Heman in 2 months. Resume Crestor. Continue metoprolol 25 mg twice a day. Will need labs in 6 weeks.  Moderate to severe MR: Patient complains of significant dyspnea on exertion which I suspect is secondary to this. No significant heart failure on exam today. Resume Lasix.  Chronic atrial fibrillation on Coumadin. This is managed by Dr. Keane Police office. We have contacted them to let them know she is missed 5 days of Coumadin. She will have to have her INR checked and Coumadin adjusted.       Medication Adjustments/Labs and Tests Ordered: Current medicines are reviewed at length with the patient today.  Concerns regarding medicines are outlined above.  Medication changes, Labs and Tests ordered today are listed in the Patient Instructions below. Patient Instructions  Medication Instructions:   Your physician recommends that you continue on your current medications as directed. Please refer to the Current Medication list given to you today.   If you need a refill on your cardiac medications before your next appointment, please call your pharmacy.  Labwork: NONE ORDER TODAY    Testing/Procedures: NONE ORDER TODAY    Follow-Up: WITH DR ALLRED  IN 6 TO 8 WEEKS    Any Other Special Instructions Will Be Listed Below (If Applicable).  Sumner Boast, PA-C  08/11/2015 11:57 AM    [facility] [address], [place], [place]  [id number] Phone: [phone]; Fax: [phone]

## 2015-08-11 NOTE — Patient Instructions (Addendum)
Medication Instructions:   Your physician recommends that you continue on your current medications as directed. Please refer to the Current Medication list given to you today.   If you need a refill on your cardiac medications before your next appointment, please call your pharmacy.  Labwork: NONE ORDER TODAY    Testing/Procedures: NONE ORDER TODAY    Follow-Up: WITH DR ALLRED IN 6 TO 8 WEEKS    Any Other Special Instructions Will Be Listed Below (If Applicable).

## 2015-08-13 DIAGNOSIS — J449 Chronic obstructive pulmonary disease, unspecified: Secondary | ICD-10-CM | POA: Diagnosis not present

## 2015-08-13 DIAGNOSIS — Z7901 Long term (current) use of anticoagulants: Secondary | ICD-10-CM | POA: Diagnosis not present

## 2015-08-13 DIAGNOSIS — R0609 Other forms of dyspnea: Secondary | ICD-10-CM | POA: Diagnosis not present

## 2015-08-13 DIAGNOSIS — I480 Paroxysmal atrial fibrillation: Secondary | ICD-10-CM | POA: Diagnosis not present

## 2015-08-13 DIAGNOSIS — Z6822 Body mass index (BMI) 22.0-22.9, adult: Secondary | ICD-10-CM | POA: Diagnosis not present

## 2015-08-14 ENCOUNTER — Other Ambulatory Visit: Payer: Medicare Other

## 2015-08-18 ENCOUNTER — Emergency Department (HOSPITAL_COMMUNITY): Payer: Medicare Other

## 2015-08-18 ENCOUNTER — Emergency Department (HOSPITAL_COMMUNITY)
Admission: EM | Admit: 2015-08-18 | Discharge: 2015-08-18 | Disposition: A | Discharge status: Home / Self Care | Payer: Medicare Other | Attending: Emergency Medicine | Admitting: Emergency Medicine

## 2015-08-18 ENCOUNTER — Other Ambulatory Visit: Payer: Medicare Other

## 2015-08-18 ENCOUNTER — Encounter (HOSPITAL_COMMUNITY): Payer: Self-pay

## 2015-08-18 DIAGNOSIS — Z8659 Personal history of other mental and behavioral disorders: Secondary | ICD-10-CM | POA: Diagnosis not present

## 2015-08-18 DIAGNOSIS — Z7982 Long term (current) use of aspirin: Secondary | ICD-10-CM | POA: Diagnosis not present

## 2015-08-18 DIAGNOSIS — Z8719 Personal history of other diseases of the digestive system: Secondary | ICD-10-CM | POA: Diagnosis not present

## 2015-08-18 DIAGNOSIS — Z7901 Long term (current) use of anticoagulants: Secondary | ICD-10-CM | POA: Diagnosis not present

## 2015-08-18 DIAGNOSIS — Z8619 Personal history of other infectious and parasitic diseases: Secondary | ICD-10-CM | POA: Insufficient documentation

## 2015-08-18 DIAGNOSIS — N183 Chronic kidney disease, stage 3 (moderate): Secondary | ICD-10-CM | POA: Insufficient documentation

## 2015-08-18 DIAGNOSIS — R269 Unspecified abnormalities of gait and mobility: Secondary | ICD-10-CM | POA: Diagnosis not present

## 2015-08-18 DIAGNOSIS — I252 Old myocardial infarction: Secondary | ICD-10-CM | POA: Diagnosis not present

## 2015-08-18 DIAGNOSIS — E785 Hyperlipidemia, unspecified: Secondary | ICD-10-CM | POA: Insufficient documentation

## 2015-08-18 DIAGNOSIS — Z85820 Personal history of malignant melanoma of skin: Secondary | ICD-10-CM | POA: Insufficient documentation

## 2015-08-18 DIAGNOSIS — R2689 Other abnormalities of gait and mobility: Secondary | ICD-10-CM | POA: Diagnosis present

## 2015-08-18 DIAGNOSIS — R197 Diarrhea, unspecified: Secondary | ICD-10-CM | POA: Diagnosis not present

## 2015-08-18 LAB — PROTIME-INR
INR: 2.62 — ABNORMAL HIGH (ref 0.00–1.49)
Prothrombin Time: 27.7 seconds — ABNORMAL HIGH (ref 11.6–15.2)

## 2015-08-18 LAB — COMPREHENSIVE METABOLIC PANEL
ALBUMIN: 3.4 g/dL — AB (ref 3.5–5.0)
ALK PHOS: 80 U/L (ref 38–126)
ALT: 26 U/L (ref 14–54)
AST: 45 U/L — AB (ref 15–41)
Anion gap: 11 (ref 5–15)
BILIRUBIN TOTAL: 1.2 mg/dL (ref 0.3–1.2)
BUN: 16 mg/dL (ref 6–20)
CALCIUM: 10.5 mg/dL — AB (ref 8.9–10.3)
CO2: 28 mmol/L (ref 22–32)
CREATININE: 1.6 mg/dL — AB (ref 0.44–1.00)
Chloride: 98 mmol/L — ABNORMAL LOW (ref 101–111)
GFR calc Af Amer: 32 mL/min — ABNORMAL LOW (ref >60)
GFR calc non Af Amer: 28 mL/min — ABNORMAL LOW (ref >60)
GLUCOSE: 146 mg/dL — AB (ref 65–99)
Potassium: 4.3 mmol/L (ref 3.5–5.1)
SODIUM: 137 mmol/L (ref 135–145)
TOTAL PROTEIN: 7.1 g/dL (ref 6.5–8.1)

## 2015-08-18 LAB — CBC
HCT: 39.7 % (ref 36.0–46.0)
Hemoglobin: 13.1 g/dL (ref 12.0–15.0)
MCH: 31.8 pg (ref 26.0–34.0)
MCHC: 33 g/dL (ref 30.0–36.0)
MCV: 96.4 fL (ref 78.0–100.0)
Platelets: 221 10*3/uL (ref 150–400)
RBC: 4.12 MIL/uL (ref 3.87–5.11)
RDW: 16.3 % — AB (ref 11.5–15.5)
WBC: 7.1 10*3/uL (ref 4.0–10.5)

## 2015-08-18 LAB — CBG MONITORING, ED: GLUCOSE-CAPILLARY: 133 mg/dL — AB (ref 65–99)

## 2015-08-18 LAB — URINALYSIS, ROUTINE W REFLEX MICROSCOPIC
Bilirubin Urine: NEGATIVE
GLUCOSE, UA: NEGATIVE mg/dL
HGB URINE DIPSTICK: NEGATIVE
KETONES UR: NEGATIVE mg/dL
Leukocytes, UA: NEGATIVE
Nitrite: NEGATIVE
PH: 6.5 (ref 5.0–8.0)
PROTEIN: NEGATIVE mg/dL
Specific Gravity, Urine: 1.01 (ref 1.005–1.030)

## 2015-08-18 MED ORDER — SODIUM CHLORIDE 0.9 % IV BOLUS (SEPSIS)
500.0000 mL | Freq: Once | INTRAVENOUS | Status: AC
  Administered 2015-08-18: 500 mL via INTRAVENOUS

## 2015-08-18 NOTE — ED Notes (Addendum)
Pt states that on 4/16 she was in the hospital for chest pain and had been "off balance" since then. Pt denies that she has been dizzy. Pt states that that the symptom has been persistent and may be slightly worse today. Pt's son with pt in triage states pt has also had some forgetfulness. Pt's son then left in the middle of the triage process. Pt states she has been to here PCP for this problem and she was told to come here. Pt is A/Ox4 at this time, but is a poor historian.

## 2015-08-18 NOTE — ED Provider Notes (Signed)
CSN: [id number]     Arrival date & time 08/18/15  1331 History   First MD Initiated Contact with Patient 08/18/15 1523     Chief Complaint  Patient presents with  . unbalanced      (Consider location/radiation/quality/duration/timing/severity/associated sxs/prior Treatment) HPI Patient is an 82 year old female with past medical history of paroxysmal A. fib (on Coumadin), recent NSTEMI, chronic kidney disease who presents with feeling off balance. Patient reports that she has felt slightly unsteady since discharge from the hospital in April. However this acutely worsened today and patient felt very off balance walking in her house. She denies headache, vision changes, numbness or weakness. She reports mild nausea but denies vomiting. In addition patient has had a week of intermittent diarrhea. She took Imodium for this 2 days ago which helped. Denies chest pain, abdominal pain, dysuria, urinary frequency or other symptoms. Per nursing staff Was Present in triage and reported the patient has also been confused as well as off balance for the past few days . However, her son left expeditiously and was unavailable for questioning.  Past Medical History  Diagnosis Date  . Migraine   . GERD (gastroesophageal reflux disease)   . Dyslipidemia   . History of scarlet fever   . PAF (paroxysmal atrial fibrillation) ([place])     a. On Coumadin  . Depression   . Melanoma ([place])   . Mitral regurgitation     a. Echo (11/15):  EF 50-55%, Gr 2 DD, MAC, prolapsing P2 segment of post MV leaflet with mod to possibly severe MR, mild LAE, normal RVF, PASP 35 mmHg;  b. Echo 5/16: Mild LVH, EF 55-60%, trivial AI, moderate MR (LVID, ES 32.3 mm), mild BAE, moderate TR, PASP 48 c. Echo 07/2015:EF 45-50%, moderate to severe MR   . NSTEMI (non-ST elevated myocardial infarction) ([place]) 07/2015    a. Troponin peak of 7.17. Cath w/ normal cors, thought to be 2ry to vasospasm  . CKD (chronic kidney disease), stage III    Past  Surgical History  Procedure Laterality Date  . Tonsillectomy    . Appendectomy    . Cataract extraction      bilateral  . Cesarean section    . Cardiac catheterization N/A 07/21/2015    Procedure: Left Heart Cath and Coronary Angiography;  Surgeon: Lorretta Harp, MD;  Location: [facility];  Service: Cardiovascular;  Laterality: N/A;   Family History  Problem Relation Age of Onset  . Colon polyps    . Heart attack Neg Hx   . Stroke Neg Hx   . Hypertension Neg Hx    Social History  Substance Use Topics  . Smoking status: Former Smoker -- 6 years    Types: Cigarettes    Quit date: 09/09/1967  . Smokeless tobacco: None     Comment: pt reports she smoked socially  . Alcohol Use: 4.2 oz/week    7 Glasses of wine per week   OB History    No data available     Review of Systems  Constitutional: Negative for fever and chills.  HENT: Positive for rhinorrhea. Negative for congestion.   Eyes: Negative for visual disturbance.  Respiratory: Negative for cough and shortness of breath.   Cardiovascular: Negative for chest pain and palpitations.  Gastrointestinal: Positive for diarrhea. Negative for nausea, vomiting and abdominal pain.  Genitourinary: Negative for dysuria, frequency and difficulty urinating.  Musculoskeletal: Positive for gait problem. Negative for back pain and neck pain.  Skin: Negative for  pallor and rash.  Neurological: Negative for dizziness, syncope and headaches.  Psychiatric/Behavioral: Positive for confusion.      Allergies  Review of patient's allergies indicates no known allergies.  Home Medications   Prior to Admission medications   Medication Sig Start Date End Date Taking? Authorizing Provider  aspirin EC 81 MG EC tablet Take 1 tablet (81 mg total) by mouth daily. 07/22/15   Erma Heritage, PA  calcium-vitamin D (OSCAL WITH D 500-200) 500-200 MG-UNIT per tablet Take 1 tablet by mouth daily.      Historical Provider, MD  Coenzyme Q10  (CO Q 10 PO) Take 100 mg by mouth daily.     Historical Provider, MD  furosemide (LASIX) 20 MG tablet TAKE 1 TABLET BY MOUTH EVERY DAY 08/04/15   Sherran Needs, NP  metoprolol tartrate (LOPRESSOR) 25 MG tablet Take 25 mg by mouth 2 (two) times daily. 08/05/15   Historical Provider, MD  Multiple Vitamins-Minerals (CENTRUM SILVER PO) Take 1 tablet by mouth daily.    Historical Provider, MD  nitroGLYCERIN (NITROSTAT) 0.4 MG SL tablet Place 1 tablet (0.4 mg total) under the tongue every 5 (five) minutes x 3 doses as needed for chest pain. 07/22/15   Erma Heritage, PA  rosuvastatin (CRESTOR) 40 MG tablet Take 1 tablet (40 mg total) by mouth daily at 6 PM. 08/11/15   Imogene Burn, PA-C  vitamin B-12 (CYANOCOBALAMIN) 100 MCG tablet Take 100 mcg by mouth daily.    Historical Provider, MD  warfarin (COUMADIN) 5 MG tablet Take as directed by the Coumadin Clinic. Will need to take 5mg  (1 tablet) starting tonight for the next 3 days following by 2.5mg  (0.5 tablet) daily until next Coumadin Check. 07/22/15   Fransisco Hertz Strader, PA   BP 120/66 mmHg  Pulse 81  Temp(Src) 97.6 F (36.4 C) (Oral)  Resp 16  Ht 5' 6.5" (1.689 m)  Wt 59.648 kg  BMI 20.91 kg/m2  SpO2 98% Physical Exam  Constitutional: She is oriented to person, place, and time. She appears well-developed and well-nourished.  HENT:  Head: Normocephalic and atraumatic.  Eyes: EOM are normal. Pupils are equal, round, and reactive to light.  Neck: Normal range of motion. Neck supple.  Cardiovascular: Normal rate, regular rhythm and intact distal pulses.   Pulmonary/Chest: Effort normal and breath sounds normal. No respiratory distress.  Abdominal: Soft. She exhibits no distension. There is no tenderness.  Musculoskeletal: Normal range of motion. She exhibits no edema or tenderness.  Neurological: She is alert and oriented to person, place, and time. She has normal strength and normal reflexes. No cranial nerve deficit or sensory deficit. She  displays a negative Romberg sign. Coordination normal. GCS eye subscore is 4. GCS verbal subscore is 5. GCS motor subscore is 6.  Gait is off balance with turning. Unable to perform tandem gait.   Skin: Skin is warm and dry. No rash noted.  Psychiatric: She has a normal mood and affect.  Nursing note and vitals reviewed.   ED Course  Procedures (including critical care time) Labs Review Labs Reviewed  COMPREHENSIVE METABOLIC PANEL - Abnormal; Notable for the following:    Chloride 98 (*)    Glucose, Bld 146 (*)    Creatinine, Ser 1.60 (*)    Calcium 10.5 (*)    Albumin 3.4 (*)    AST 45 (*)    GFR calc non Af Amer 28 (*)    GFR calc Af Amer 32 (*)  All other components within normal limits  CBC - Abnormal; Notable for the following:    RDW 16.3 (*)    All other components within normal limits  PROTIME-INR - Abnormal; Notable for the following:    Prothrombin Time 27.7 (*)    INR 2.62 (*)    All other components within normal limits  CBG MONITORING, ED - Abnormal; Notable for the following:    Glucose-Capillary 133 (*)    All other components within normal limits  URINALYSIS, ROUTINE W REFLEX MICROSCOPIC (NOT AT [facility])    Imaging Review Mr Brain Wo Contrast  08/18/2015  CLINICAL DATA:  Balance problems. EXAM: MRI HEAD WITHOUT CONTRAST TECHNIQUE: Multiplanar, multiecho pulse sequences of the brain and surrounding structures were obtained without intravenous contrast. COMPARISON:  MRI head 02/16/2006 FINDINGS: Moderate atrophy has progressed in the interval. Ventricular enlargement has progressed due to atrophy. Negative for acute infarct. Chronic white matter hyperintensities throughout both cerebral hemispheres have progressed. This is due to chronic microvascular ischemic change. Mild chronic ischemic change in the pons bilaterally. Negative for intracranial hemorrhage. Negative for mass or edema. No shift of the midline structures. Pituitary normal in size.  Normal skullbase.  Mucosal edema in the left sphenoid sinus with air-fluid level. Circle of Willis patent. IMPRESSION: Progression of atrophy and chronic microvascular ischemia since 2007 No acute infarct or mass Sphenoid sinusitis with air-fluid level. Electronically Signed   By: Franchot Gallo M.D.   On: 08/18/2015 20:46   I have personally reviewed and evaluated these images and lab results as part of my medical decision-making.   EKG Interpretation   Date/Time:  Monday Aug 18 2015 17:09:49 EDT Ventricular Rate:  97 PR Interval:    QRS Duration: 127 QT Interval:  352 QTC Calculation: 447 R Axis:   79 Text Interpretation:  Atrial fibrillation Nonspecific intraventricular  conduction delay Anteroseptal infarct, old Nonspecific repol abnormality,  diffuse leads Baseline wander in lead(s) V5 No significant change since  last tracing Confirmed by Maryan Rued  MD, Loree Fee ([id number]) on 08/18/2015  6:19:52 PM      MDM   Final diagnoses:  Diarrhea, unspecified type  Gait difficulty    Patient is an 82 year old female past medical history as above who presents with several days of feeling off balance. She also admits to intermittent diarrhea this week requiring her to take Imodium 2 days ago. Presentation she is afebrile with stable vital signs. She is alert and oriented 4 without any focal neurologic deficits on exam. She is able to ambulate but does get off balance with turning corners. Labs remarkable only for mildly elevated renal function can consistent with dehydration. UA does not show signs of infection. IV fluids were given. Given gait instability MRI was ordered to rule out possibly posterior circulation stroke. MRI is negative for acute intracranial abnormalities. Patient is tolerating by mouth and is able to ambulate that she is somewhat off balance. Case management is consulted to help provide patient with a walker prior to discharge. Patient states that she [facility] [employer] [facility]  [facility] and does not need further physical therapy. She feels comfortable going home in the company of her son. She was discharged in stable condition. Strict return precautions were given. Patient will return for unilateral numbness or weakness, syncopal episodes or worsening vomiting or diarrhea where she is not able to tolerate fluids.  Discussed with Dr. Maryan Rued, ED attending.    Gibson Ramp, MD 08/19/15 [place],  MD 08/19/15 2346

## 2015-08-18 NOTE — ED Notes (Signed)
Patient transported to MRI 

## 2015-08-18 NOTE — ED Notes (Signed)
Pt's local son is Denice Paradise [phone]. Pt's son in [place] is Laverna Peace [phone]. Pt currently on the phone talking with  Laverna Peace.

## 2015-08-18 NOTE — ED Notes (Signed)
FAST stroke exam negative

## 2015-08-18 NOTE — ED Notes (Signed)
Pt given a meal.

## 2015-08-18 NOTE — ED Notes (Signed)
Walker use discussed with pt and pt return demonstrated. Discharge discussed with pt and pt's son and both agreeable to follow up plan.

## 2015-08-18 NOTE — Care Management (Signed)
CM consulted by Dr. Tamala Julian regarding patient having an unsteady gait and may benefit from a rolling walking and PT gait training program.  CM met with patient a bedside discussed recommendations patient  is agreeable.  Rolling walker was delivered to room prior to discharge. Also discussed PT recommendation patient declined reports she received balance training at the [facility] 3 x week. Updated Adam RN on Pod D on discharge plan. No further questions or concerns at this time. Patient awaiting son to accompany via private vehicle. No further CM needs identified.

## 2015-08-18 NOTE — ED Notes (Addendum)
Pt ambulated in the hallway. Noted to have a steady gate, but would have an occasional studder in her step. SpO2 remained 96% during ambulation.

## 2015-08-19 ENCOUNTER — Telehealth: Payer: Self-pay | Admitting: Cardiovascular Disease

## 2015-08-19 NOTE — Telephone Encounter (Signed)
Pt call received as he was thinking Dr. Gwenlyn Found was pt primary cardiologist but on evaluation it is Dr. Rayann Heman. Pt son Clair Gulling called as he is worried about mother being on to many medications. He has been on with PCP they sent her to ED yesterday to be evaluated. He stated is worried mother is taking to many medications and he wants to talk to someone about it. Pt is at a facility and has scheduled appt with Dr. Rayann Heman on 09/29/15 but he would like to speak with someone before then. He wants pt to stop taking Coumidan thinks it is what is making her confused, stressed the importance of that medication and her rhythm. Told him I would forward to Dr. Rayann Heman and his team. Verbalized understanding, no additional questions at this time.

## 2015-08-19 NOTE — Telephone Encounter (Signed)
New message     The son is concerned about the pt saying she is generalized weakness, gets tied after walking about 50 feet, per other care providers and the memory loss is getting worse, according to the son. The son would like to speak with a nurse concerning the condition of the pt.

## 2015-08-21 DIAGNOSIS — M859 Disorder of bone density and structure, unspecified: Secondary | ICD-10-CM | POA: Diagnosis not present

## 2015-08-21 DIAGNOSIS — Z6821 Body mass index (BMI) 21.0-21.9, adult: Secondary | ICD-10-CM | POA: Diagnosis not present

## 2015-08-21 DIAGNOSIS — E559 Vitamin D deficiency, unspecified: Secondary | ICD-10-CM | POA: Diagnosis not present

## 2015-08-21 DIAGNOSIS — Z7901 Long term (current) use of anticoagulants: Secondary | ICD-10-CM | POA: Diagnosis not present

## 2015-08-21 DIAGNOSIS — I509 Heart failure, unspecified: Secondary | ICD-10-CM | POA: Diagnosis not present

## 2015-08-21 DIAGNOSIS — J449 Chronic obstructive pulmonary disease, unspecified: Secondary | ICD-10-CM | POA: Diagnosis not present

## 2015-08-21 DIAGNOSIS — N183 Chronic kidney disease, stage 3 (moderate): Secondary | ICD-10-CM | POA: Diagnosis not present

## 2015-08-21 DIAGNOSIS — I480 Paroxysmal atrial fibrillation: Secondary | ICD-10-CM | POA: Diagnosis not present

## 2015-08-21 DIAGNOSIS — I951 Orthostatic hypotension: Secondary | ICD-10-CM | POA: Diagnosis not present

## 2015-08-21 NOTE — Telephone Encounter (Signed)
Recently seen by Estella Husk.  Her notes reviewed. It appears that she has developed multiple CV issues with recent hospitalization for NSTEMI with valvular heart disease and reduced EF.  Stable from EP standpoint it seems with chronic Afib. She needs to establish with general cardiology for follow-up of reduced EF/ coronary spasm and MR. Concerns regarding medicines should be addressed with primary care.  I will see as scheduled in June.

## 2015-08-25 DIAGNOSIS — R278 Other lack of coordination: Secondary | ICD-10-CM | POA: Diagnosis not present

## 2015-08-25 DIAGNOSIS — M6281 Muscle weakness (generalized): Secondary | ICD-10-CM | POA: Diagnosis not present

## 2015-08-25 DIAGNOSIS — R262 Difficulty in walking, not elsewhere classified: Secondary | ICD-10-CM | POA: Diagnosis not present

## 2015-08-25 DIAGNOSIS — R2689 Other abnormalities of gait and mobility: Secondary | ICD-10-CM | POA: Diagnosis not present

## 2015-08-25 DIAGNOSIS — N393 Stress incontinence (female) (male): Secondary | ICD-10-CM | POA: Diagnosis not present

## 2015-08-26 DIAGNOSIS — R278 Other lack of coordination: Secondary | ICD-10-CM | POA: Diagnosis not present

## 2015-08-26 DIAGNOSIS — R262 Difficulty in walking, not elsewhere classified: Secondary | ICD-10-CM | POA: Diagnosis not present

## 2015-08-26 DIAGNOSIS — M6281 Muscle weakness (generalized): Secondary | ICD-10-CM | POA: Diagnosis not present

## 2015-08-26 DIAGNOSIS — R2689 Other abnormalities of gait and mobility: Secondary | ICD-10-CM | POA: Diagnosis not present

## 2015-08-26 DIAGNOSIS — N393 Stress incontinence (female) (male): Secondary | ICD-10-CM | POA: Diagnosis not present

## 2015-08-27 DIAGNOSIS — R278 Other lack of coordination: Secondary | ICD-10-CM | POA: Diagnosis not present

## 2015-08-27 DIAGNOSIS — R2689 Other abnormalities of gait and mobility: Secondary | ICD-10-CM | POA: Diagnosis not present

## 2015-08-27 DIAGNOSIS — R262 Difficulty in walking, not elsewhere classified: Secondary | ICD-10-CM | POA: Diagnosis not present

## 2015-08-27 DIAGNOSIS — N393 Stress incontinence (female) (male): Secondary | ICD-10-CM | POA: Diagnosis not present

## 2015-08-27 DIAGNOSIS — M6281 Muscle weakness (generalized): Secondary | ICD-10-CM | POA: Diagnosis not present

## 2015-08-28 DIAGNOSIS — M6281 Muscle weakness (generalized): Secondary | ICD-10-CM | POA: Diagnosis not present

## 2015-08-28 DIAGNOSIS — N393 Stress incontinence (female) (male): Secondary | ICD-10-CM | POA: Diagnosis not present

## 2015-08-28 DIAGNOSIS — R278 Other lack of coordination: Secondary | ICD-10-CM | POA: Diagnosis not present

## 2015-08-28 DIAGNOSIS — R2689 Other abnormalities of gait and mobility: Secondary | ICD-10-CM | POA: Diagnosis not present

## 2015-08-28 DIAGNOSIS — R262 Difficulty in walking, not elsewhere classified: Secondary | ICD-10-CM | POA: Diagnosis not present

## 2015-08-29 DIAGNOSIS — R2689 Other abnormalities of gait and mobility: Secondary | ICD-10-CM | POA: Diagnosis not present

## 2015-08-29 DIAGNOSIS — R262 Difficulty in walking, not elsewhere classified: Secondary | ICD-10-CM | POA: Diagnosis not present

## 2015-08-29 DIAGNOSIS — R278 Other lack of coordination: Secondary | ICD-10-CM | POA: Diagnosis not present

## 2015-08-29 DIAGNOSIS — M6281 Muscle weakness (generalized): Secondary | ICD-10-CM | POA: Diagnosis not present

## 2015-08-29 DIAGNOSIS — N393 Stress incontinence (female) (male): Secondary | ICD-10-CM | POA: Diagnosis not present

## 2015-08-30 ENCOUNTER — Encounter (HOSPITAL_COMMUNITY): Payer: Self-pay | Admitting: Emergency Medicine

## 2015-08-30 ENCOUNTER — Emergency Department (HOSPITAL_COMMUNITY): Payer: Medicare Other

## 2015-08-30 ENCOUNTER — Observation Stay (HOSPITAL_COMMUNITY): Payer: Medicare Other

## 2015-08-30 ENCOUNTER — Observation Stay (HOSPITAL_COMMUNITY)
Admission: EM | Admit: 2015-08-30 | Discharge: 2015-08-31 | Disposition: A | Discharge status: Skilled Nursing Facility | Payer: Medicare Other | Attending: Internal Medicine | Admitting: Internal Medicine

## 2015-08-30 DIAGNOSIS — I480 Paroxysmal atrial fibrillation: Secondary | ICD-10-CM | POA: Diagnosis not present

## 2015-08-30 DIAGNOSIS — Z7901 Long term (current) use of anticoagulants: Secondary | ICD-10-CM | POA: Insufficient documentation

## 2015-08-30 DIAGNOSIS — I130 Hypertensive heart and chronic kidney disease with heart failure and stage 1 through stage 4 chronic kidney disease, or unspecified chronic kidney disease: Secondary | ICD-10-CM | POA: Diagnosis not present

## 2015-08-30 DIAGNOSIS — I495 Sick sinus syndrome: Secondary | ICD-10-CM | POA: Diagnosis not present

## 2015-08-30 DIAGNOSIS — R0602 Shortness of breath: Secondary | ICD-10-CM | POA: Diagnosis not present

## 2015-08-30 DIAGNOSIS — R0789 Other chest pain: Secondary | ICD-10-CM | POA: Diagnosis not present

## 2015-08-30 DIAGNOSIS — I272 Other secondary pulmonary hypertension: Secondary | ICD-10-CM | POA: Diagnosis not present

## 2015-08-30 DIAGNOSIS — I5041 Acute combined systolic (congestive) and diastolic (congestive) heart failure: Secondary | ICD-10-CM | POA: Diagnosis present

## 2015-08-30 DIAGNOSIS — I5023 Acute on chronic systolic (congestive) heart failure: Secondary | ICD-10-CM | POA: Diagnosis not present

## 2015-08-30 DIAGNOSIS — K219 Gastro-esophageal reflux disease without esophagitis: Secondary | ICD-10-CM | POA: Diagnosis not present

## 2015-08-30 DIAGNOSIS — I4891 Unspecified atrial fibrillation: Secondary | ICD-10-CM | POA: Diagnosis not present

## 2015-08-30 DIAGNOSIS — I340 Nonrheumatic mitral (valve) insufficiency: Secondary | ICD-10-CM | POA: Diagnosis present

## 2015-08-30 DIAGNOSIS — Z87891 Personal history of nicotine dependence: Secondary | ICD-10-CM | POA: Insufficient documentation

## 2015-08-30 DIAGNOSIS — K8020 Calculus of gallbladder without cholecystitis without obstruction: Secondary | ICD-10-CM | POA: Diagnosis not present

## 2015-08-30 DIAGNOSIS — R413 Other amnesia: Secondary | ICD-10-CM | POA: Diagnosis not present

## 2015-08-30 DIAGNOSIS — R079 Chest pain, unspecified: Secondary | ICD-10-CM

## 2015-08-30 DIAGNOSIS — R7401 Elevation of levels of liver transaminase levels: Secondary | ICD-10-CM | POA: Diagnosis present

## 2015-08-30 DIAGNOSIS — R1013 Epigastric pain: Secondary | ICD-10-CM | POA: Diagnosis present

## 2015-08-30 DIAGNOSIS — E785 Hyperlipidemia, unspecified: Secondary | ICD-10-CM | POA: Diagnosis not present

## 2015-08-30 DIAGNOSIS — I252 Old myocardial infarction: Secondary | ICD-10-CM | POA: Diagnosis not present

## 2015-08-30 DIAGNOSIS — Z7982 Long term (current) use of aspirin: Secondary | ICD-10-CM | POA: Insufficient documentation

## 2015-08-30 DIAGNOSIS — N183 Chronic kidney disease, stage 3 (moderate): Secondary | ICD-10-CM | POA: Diagnosis not present

## 2015-08-30 DIAGNOSIS — I509 Heart failure, unspecified: Secondary | ICD-10-CM

## 2015-08-30 DIAGNOSIS — K921 Melena: Secondary | ICD-10-CM | POA: Diagnosis present

## 2015-08-30 DIAGNOSIS — J449 Chronic obstructive pulmonary disease, unspecified: Secondary | ICD-10-CM | POA: Diagnosis present

## 2015-08-30 DIAGNOSIS — R740 Nonspecific elevation of levels of transaminase and lactic acid dehydrogenase [LDH]: Secondary | ICD-10-CM

## 2015-08-30 DIAGNOSIS — I5031 Acute diastolic (congestive) heart failure: Secondary | ICD-10-CM | POA: Diagnosis present

## 2015-08-30 DIAGNOSIS — Z79899 Other long term (current) drug therapy: Secondary | ICD-10-CM | POA: Diagnosis not present

## 2015-08-30 DIAGNOSIS — I214 Non-ST elevation (NSTEMI) myocardial infarction: Secondary | ICD-10-CM | POA: Diagnosis present

## 2015-08-30 DIAGNOSIS — R072 Precordial pain: Secondary | ICD-10-CM | POA: Diagnosis not present

## 2015-08-30 LAB — CBC WITH DIFFERENTIAL/PLATELET
BASOS ABS: 0 10*3/uL (ref 0.0–0.1)
Basophils Relative: 0 %
EOS ABS: 0.1 10*3/uL (ref 0.0–0.7)
EOS PCT: 1 %
HCT: 44.3 % (ref 36.0–46.0)
Hemoglobin: 14.2 g/dL (ref 12.0–15.0)
Lymphocytes Relative: 12 %
Lymphs Abs: 1.4 10*3/uL (ref 0.7–4.0)
MCH: 31.6 pg (ref 26.0–34.0)
MCHC: 32.1 g/dL (ref 30.0–36.0)
MCV: 98.7 fL (ref 78.0–100.0)
Monocytes Absolute: 0.7 10*3/uL (ref 0.1–1.0)
Monocytes Relative: 6 %
NEUTROS PCT: 81 %
Neutro Abs: 9.3 10*3/uL — ABNORMAL HIGH (ref 1.7–7.7)
PLATELETS: 257 10*3/uL (ref 150–400)
RBC: 4.49 MIL/uL (ref 3.87–5.11)
RDW: 16.4 % — AB (ref 11.5–15.5)
WBC: 11.5 10*3/uL — AB (ref 4.0–10.5)

## 2015-08-30 LAB — PROTIME-INR
INR: 3.45 — AB (ref 0.00–1.49)
PROTHROMBIN TIME: 34 s — AB (ref 11.6–15.2)

## 2015-08-30 LAB — URINALYSIS, ROUTINE W REFLEX MICROSCOPIC
BILIRUBIN URINE: NEGATIVE
Glucose, UA: NEGATIVE mg/dL
Hgb urine dipstick: NEGATIVE
Ketones, ur: NEGATIVE mg/dL
Leukocytes, UA: NEGATIVE
NITRITE: NEGATIVE
PH: 7 (ref 5.0–8.0)
Protein, ur: NEGATIVE mg/dL
SPECIFIC GRAVITY, URINE: 1.012 (ref 1.005–1.030)

## 2015-08-30 LAB — TROPONIN I: TROPONIN I: 0.03 ng/mL (ref <0.031)

## 2015-08-30 LAB — COMPREHENSIVE METABOLIC PANEL
ALT: 31 U/L (ref 14–54)
AST: 47 U/L — ABNORMAL HIGH (ref 15–41)
Albumin: 3.5 g/dL (ref 3.5–5.0)
Alkaline Phosphatase: 86 U/L (ref 38–126)
Anion gap: 14 (ref 5–15)
BUN: 17 mg/dL (ref 6–20)
CHLORIDE: 93 mmol/L — AB (ref 101–111)
CO2: 28 mmol/L (ref 22–32)
Calcium: 9 mg/dL (ref 8.9–10.3)
Creatinine, Ser: 1.63 mg/dL — ABNORMAL HIGH (ref 0.44–1.00)
GFR, EST AFRICAN AMERICAN: 32 mL/min — AB (ref >60)
GFR, EST NON AFRICAN AMERICAN: 27 mL/min — AB (ref >60)
Glucose, Bld: 95 mg/dL (ref 65–99)
POTASSIUM: 3.8 mmol/L (ref 3.5–5.1)
Sodium: 135 mmol/L (ref 135–145)
Total Bilirubin: 2.5 mg/dL — ABNORMAL HIGH (ref 0.3–1.2)
Total Protein: 7.1 g/dL (ref 6.5–8.1)

## 2015-08-30 LAB — LIPASE, BLOOD: Lipase: 25 U/L (ref 11–51)

## 2015-08-30 LAB — MRSA PCR SCREENING: MRSA BY PCR: NEGATIVE

## 2015-08-30 LAB — BRAIN NATRIURETIC PEPTIDE: B NATRIURETIC PEPTIDE 5: 1433 pg/mL — AB (ref 0.0–100.0)

## 2015-08-30 LAB — GLUCOSE, CAPILLARY: Glucose-Capillary: 94 mg/dL (ref 65–99)

## 2015-08-30 LAB — CK: Total CK: 63 U/L (ref 38–234)

## 2015-08-30 MED ORDER — SODIUM CHLORIDE 0.9 % IV SOLN: 250.0000 mL | INTRAVENOUS | Status: DC | PRN

## 2015-08-30 MED ORDER — SODIUM CHLORIDE 0.9% FLUSH: 3.0000 mL | INTRAVENOUS | Status: DC | PRN

## 2015-08-30 MED ORDER — PANTOPRAZOLE SODIUM 40 MG IV SOLR: 40.0000 mg | Freq: Two times a day (BID) | INTRAVENOUS | Status: DC

## 2015-08-30 MED ORDER — FUROSEMIDE 20 MG PO TABS
20.0000 mg | ORAL_TABLET | Freq: Every day | ORAL | Status: DC
  Administered 2015-08-31: 20 mg via ORAL
  Filled 2015-08-30 (×2): qty 1

## 2015-08-30 MED ORDER — ONDANSETRON HCL 4 MG/2ML IJ SOLN: 4.0000 mg | Freq: Four times a day (QID) | INTRAMUSCULAR | Status: DC | PRN

## 2015-08-30 MED ORDER — PANTOPRAZOLE SODIUM 40 MG IV SOLR
40.0000 mg | INTRAVENOUS | Status: DC
  Administered 2015-08-30: 40 mg via INTRAVENOUS
  Filled 2015-08-30: qty 40

## 2015-08-30 MED ORDER — METOPROLOL TARTRATE 25 MG PO TABS
25.0000 mg | ORAL_TABLET | Freq: Two times a day (BID) | ORAL | Status: DC
  Administered 2015-08-31: 25 mg via ORAL
  Filled 2015-08-30 (×2): qty 1

## 2015-08-30 MED ORDER — ASPIRIN EC 81 MG PO TBEC
81.0000 mg | DELAYED_RELEASE_TABLET | Freq: Every day | ORAL | Status: DC
  Administered 2015-08-31: 81 mg via ORAL
  Filled 2015-08-30 (×2): qty 1

## 2015-08-30 MED ORDER — VITAMIN B-12 1000 MCG PO TABS
1000.0000 ug | ORAL_TABLET | Freq: Every morning | ORAL | Status: DC
  Administered 2015-08-31: 1000 ug via ORAL
  Filled 2015-08-30 (×2): qty 1

## 2015-08-30 MED ORDER — FUROSEMIDE 10 MG/ML IJ SOLN
20.0000 mg | Freq: Once | INTRAMUSCULAR | Status: AC
  Administered 2015-08-30: 20 mg via INTRAVENOUS
  Filled 2015-08-30: qty 2

## 2015-08-30 MED ORDER — ACETAMINOPHEN 325 MG PO TABS
650.0000 mg | ORAL_TABLET | ORAL | Status: DC | PRN
  Administered 2015-08-30: 650 mg via ORAL
  Filled 2015-08-30: qty 2

## 2015-08-30 MED ORDER — SODIUM CHLORIDE 0.9% FLUSH
3.0000 mL | Freq: Two times a day (BID) | INTRAVENOUS | Status: DC
  Administered 2015-08-30 – 2015-08-31 (×3): 3 mL via INTRAVENOUS

## 2015-08-30 MED ORDER — CALCIUM CARBONATE-VITAMIN D 500-200 MG-UNIT PO TABS
1.0000 | ORAL_TABLET | Freq: Every day | ORAL | Status: DC
  Administered 2015-08-31: 1 via ORAL
  Filled 2015-08-30 (×2): qty 1

## 2015-08-30 MED ORDER — ROSUVASTATIN CALCIUM 40 MG PO TABS
40.0000 mg | ORAL_TABLET | Freq: Every morning | ORAL | Status: DC
  Administered 2015-08-31: 40 mg via ORAL
  Filled 2015-08-30 (×2): qty 1

## 2015-08-30 MED ORDER — VITAMIN D (ERGOCALCIFEROL) 1.25 MG (50000 UNIT) PO CAPS: 50000.0000 [IU] | ORAL_CAPSULE | ORAL | Status: DC

## 2015-08-30 NOTE — ED Notes (Signed)
Attempted report 

## 2015-08-30 NOTE — H&P (Signed)
History and Physical    Nancy [name] YQM:[id number] DOB: 25-Jul-1925 DOA: 08/30/2015   PCP: Gwen Pounds, MD   Patient coming from/Resides with: [facility]/lives alone  Chief Complaint: Left shoulder and epigastric pain  HPI: Nancy [name] is a 82 y.o. female with medical history significant for paroxysmal atrial fibrillation on Coumadin, moderate mitral regurgitation, stage III chronic kidney disease, dyslipidemia, known diastolic heart failure with moderate pulmonary hypertension, and recent admission last April for non-STEMI. Patient found to have normal coronary arteries on cardiac catheterization and it was felt her cardiac event was related to vasospasm. Since discharge patient has presented to the ER with 1 additional time on 5/15 for diarrhea that lasted a total of 2 days and was not associated with abdominal pain or any bleeding although was yellow in color at one point. Today she presents to the ER after awakening during the night around 2 AM with significant right shoulder pain. She took an aspirin and went back to sleep. When she awakened around 8:00 this morning she was having epigastric discomfort that increased with taking deep breath. She was also complaining of mid thoracic back pain with taking deep breath. She was given nitroglycerin without any relief of her symptoms. Her troponin was normal in the ER and EKG essentially unchanged from previous noting she has some inferolateral ST segment changes that are chronic with the totality of recent EKGs have been evaluated.  ED Course:  PO 97.6-129/93, pulse 111, spray shins 19-room air saturations 98% Two-view chest x-ray: Very mild congestive heart failure Lab data: Sodium 135, potassium 3.8, chloride 93, BUN 17, creatinine 1.63, GFR 27, LFTs normal except for AST 47 and total bilirubin 2.5, BNP 1433 with no old for comparisons, troponin 0.03, WBCs 11,500 with neutrophils 81% absolute neutrophils 9.3%, INR  supratherapeutic at 3.45 with a PTT of 34 Lasix 20 mg IV 1 dose  Review of Systems:  In addition to the HPI above,  No Fever-chills, myalgias or other constitutional symptoms; she reports increased personal stressors noting recently has moved to an independent living facility No Headache, changes with Vision or hearing, new weakness, tingling, numbness in any extremity, No problems swallowing food or Liquids, indigestion/reflux No Chest pain, Cough or Shortness of Breath, palpitations, orthopnea or DOE No N/V; for 1 week she has been having black tarry stools and she does not take iron pills No dysuria, hematuria or flank pain No new skin rashes, lesions, masses or bruises, No new joints pains-aches; she is recovering from recent foot fracture and was having immobilized with a boot in place No recent weight gain or loss No polyuria, polydypsia or polyphagia,   Past Medical History  Diagnosis Date  . Migraine   . GERD (gastroesophageal reflux disease)   . Dyslipidemia   . History of scarlet fever   . PAF (paroxysmal atrial fibrillation) (HCC)     a. On Coumadin  . Depression   . Melanoma (HCC)   . Mitral regurgitation     a. Echo (11/15):  EF 50-55%, Gr 2 DD, MAC, prolapsing P2 segment of post MV leaflet with mod to possibly severe MR, mild LAE, normal RVF, PASP 35 mmHg;  b. Echo 5/16: Mild LVH, EF 55-60%, trivial AI, moderate MR (LVID, ES 32.3 mm), mild BAE, moderate TR, PASP 48 c. Echo 07/2015:EF 45-50%, moderate to severe MR   . NSTEMI (non-ST elevated myocardial infarction) (HCC) 07/2015    a. Troponin peak of 7.17. Cath w/ normal cors, thought to be 2ry  to vasospasm  . CKD (chronic kidney disease), stage III     Past Surgical History  Procedure Laterality Date  . Tonsillectomy    . Appendectomy    . Cataract extraction      bilateral  . Cesarean section    . Cardiac catheterization N/A 07/21/2015    Procedure: Left Heart Cath and Coronary Angiography;  Surgeon: Runell Gess, MD;  Location: [facility] INVASIVE CV LAB;  Service: Cardiovascular;  Laterality: N/A;  . Skin biopsy       reports that she quit smoking about 48 years ago. Her smoking use included Cigarettes. She quit after 6 years of use. She does not have any smokeless tobacco history on file. She reports that she drinks about 4.2 oz of alcohol per week. She reports that she does not use illicit drugs.  Mobility: Rolling walker Work history: Not obtained   No Known Allergies  Family History  Problem Relation Age of Onset  . Colon polyps    . Heart attack Neg Hx   . Stroke Neg Hx   . Hypertension Neg Hx      Prior to Admission medications   Medication Sig Start Date End Date Taking? Authorizing Provider  aspirin EC 81 MG EC tablet Take 1 tablet (81 mg total) by mouth daily. 07/22/15  Yes Ellsworth Lennox, PA  calcium-vitamin D (OSCAL WITH D 500-200) 500-200 MG-UNIT per tablet Take 1 tablet by mouth daily.     Yes Historical Provider, MD  Coenzyme Q10 (CO Q 10 PO) Take 100 mg by mouth daily.    Yes Historical Provider, MD  ergocalciferol (VITAMIN D2) 50000 units capsule Take 50,000 Units by mouth once a week.   Yes Historical Provider, MD  furosemide (LASIX) 20 MG tablet TAKE 1 TABLET BY MOUTH EVERY DAY 08/04/15  Yes Newman Nip, NP  metoprolol tartrate (LOPRESSOR) 25 MG tablet Take 25 mg by mouth 2 (two) times daily. 08/05/15  Yes Historical Provider, MD  Multiple Vitamins-Minerals (CENTRUM SILVER PO) Take 1 tablet by mouth daily.   Yes Historical Provider, MD  rosuvastatin (CRESTOR) 40 MG tablet Take 1 tablet (40 mg total) by mouth daily at 6 PM. 08/11/15  Yes Dyann Kief, PA-C  vitamin B-12 (CYANOCOBALAMIN) 1000 MCG tablet Take 1,000 mcg by mouth every morning.   Yes Historical Provider, MD  warfarin (COUMADIN) 5 MG tablet Take as directed by the [facility]. Will need to take 5mg  (1 tablet) starting tonight for the next 3 days following by 2.5mg  (0.5 tablet) daily until next Coumadin  Check. Patient taking differently: Take 2.5 mg by mouth every morning on Sun/Tues/Thurs/Sat and 5 mg on Mon/Wed/Fri (mornings) 07/22/15  Yes Ellsworth Lennox, PA  nitroGLYCERIN (NITROSTAT) 0.4 MG SL tablet Place 1 tablet (0.4 mg total) under the tongue every 5 (five) minutes x 3 doses as needed for chest pain. 07/22/15   Ellsworth Lennox, PA    Physical Exam: Filed Vitals:   08/30/15 1230 08/30/15 1308 08/30/15 1351 08/30/15 1400  BP: 129/88 105/82  110/70  Pulse: 102   102  Temp:      TempSrc:      Resp: 27   22  Weight:      SpO2: 97%  97% 97%      Constitutional: NAD, calm, comfortable Eyes: PERRL, lids and conjunctivae normal ENMT: Mucous membranes are moist. Posterior pharynx clear of any exudate or lesions.Normal dentition.  Neck: normal, supple, no masses, no thyromegaly Respiratory: clear  to auscultation bilaterallyAlthough somewhat diminished in the bases, no wheezing, no crackles. Normal respiratory effort. No accessory muscle use. Room air Cardiovascular: Irregular rate and atrial fibrillation rhythm, grade 2/6 systolic murmurs, no rubs / gallops. No extremity edema. 2+ pedal pulses. No carotid bruits.  Abdomen: Minimal tenderness of the epigastrium, no masses palpated. No hepatosplenomegaly. Bowel sounds positive.  Musculoskeletal: no clubbing / cyanosis. No joint deformity upper and lower extremities. Good ROM, no contractures. Normal muscle tone. No tenderness or deformity when palpating over the posterior spine Skin: no rashes, lesions, ulcers. No induration Neurologic: CN 2-12 grossly intact. Sensation intact, DTR normal. Strength 5/5 x all 4 extremities.  Psychiatric: Normal judgment and insight. Alert and oriented x 3. Normal mood. Occasionally has short-term memory deficits in regards to recalling information about whether she was having chest pain or epigastric pain   Labs on Admission: I have personally reviewed following labs and imaging  studies  CBC:  Recent Labs Lab 08/30/15 1020  WBC 11.5*  NEUTROABS 9.3*  HGB 14.2  HCT 44.3  MCV 98.7  PLT 257   Basic Metabolic Panel:  Recent Labs Lab 08/30/15 1020  NA 135  K 3.8  CL 93*  CO2 28  GLUCOSE 95  BUN 17  CREATININE 1.63*  CALCIUM 9.0   GFR: Estimated Creatinine Clearance: 22.3 mL/min (by C-G formula based on Cr of 1.63). Liver Function Tests:  Recent Labs Lab 08/30/15 1020  AST 47*  ALT 31  ALKPHOS 86  BILITOT 2.5*  PROT 7.1  ALBUMIN 3.5   No results for input(s): LIPASE, AMYLASE in the last 168 hours. No results for input(s): AMMONIA in the last 168 hours. Coagulation Profile:  Recent Labs Lab 08/30/15 1020  INR 3.45*   Cardiac Enzymes:  Recent Labs Lab 08/30/15 1020  TROPONINI 0.03   BNP (last 3 results) No results for input(s): PROBNP in the last 8760 hours. HbA1C: No results for input(s): HGBA1C in the last 72 hours. CBG: No results for input(s): GLUCAP in the last 168 hours. Lipid Profile: No results for input(s): CHOL, HDL, LDLCALC, TRIG, CHOLHDL, LDLDIRECT in the last 72 hours. Thyroid Function Tests: No results for input(s): TSH, T4TOTAL, FREET4, T3FREE, THYROIDAB in the last 72 hours. Anemia Panel: No results for input(s): VITAMINB12, FOLATE, FERRITIN, TIBC, IRON, RETICCTPCT in the last 72 hours. Urine analysis:    Component Value Date/Time   COLORURINE YELLOW 08/18/2015 1642   APPEARANCEUR CLEAR 08/18/2015 1642   LABSPEC 1.010 08/18/2015 1642   PHURINE 6.5 08/18/2015 1642   GLUCOSEU NEGATIVE 08/18/2015 1642   HGBUR NEGATIVE 08/18/2015 1642   BILIRUBINUR NEGATIVE 08/18/2015 1642   KETONESUR NEGATIVE 08/18/2015 1642   PROTEINUR NEGATIVE 08/18/2015 1642   NITRITE NEGATIVE 08/18/2015 1642   LEUKOCYTESUR NEGATIVE 08/18/2015 1642   Sepsis Labs: @LABRCNTIP (procalcitonin:4,lacticidven:4) )No results found for this or any previous visit (from the past 240 hour(s)).   Radiological Exams on Admission: Dg Chest 2  View  08/30/2015  CLINICAL DATA:  82 year old female with acute onset of right-sided shoulder pain at 2 a.m. yesterday evening. Midsternal chest pain developing at 7 a.m. this morning. Intermittent shortness of breath. EXAM: CHEST  2 VIEW COMPARISON:  Chest x-ray 07/20/2015. FINDINGS: Trace bilateral pleural effusions. No acute consolidative airspace disease. Mild cephalization of the pulmonary vasculature with indistinct interstitial markings and thickening of the fissures, suggestive of mild interstitial pulmonary edema. Mild cardiomegaly. No pneumothorax. Upper mediastinal contours are within normal limits. Atherosclerosis in the thoracic aorta. IMPRESSION: 1. The appearance the chest  suggests very mild congestive heart failure. 2. Atherosclerosis. Electronically Signed   By: Trudie Reed M.D.   On: 08/30/2015 11:41    EKG: (Independently reviewed) atrial fibrillation with ventricular rate 108 bpm, nonspecific ST segment downsloping in anterolateral leads which although seems slightly change when compared to EKG from 5:15 previous EKGs from April and including cardiology office visit from 8 showed these changes are stable  Assessment/Plan Principal Problem:   Acute combined systolic (EF 45%) and grade 3 diastolic heart failure/  Mitral regurgitation/  Moderate to severe pulmonary hypertension  -Patient presented with chest discomfort and chest x-ray concerning for possible very mild heart failure exacerbation; she has no hypoxemia or reported shortness of breath -Was given a one-time dose of Lasix in the ER and will continue home dose of Lasix tomorrow -Suspect patient may have had a burst of RVR related to her pain symptoms prior to presentation and this may have precipitated a very mild CHF exacerbation -Repeat chest x-ray in a.m. -Continue preadmission Lasix and metoprolol  Active Problems:   Atrial fibrillation  -Currently rate controlled with resting heart rates no higher than 110  bpm -Continue beta blocker -CHADVASc= 5 -On warfarin but both patient and her son who is at the bedside would like to transition to NOAC-discuss with pharmacy who recommended begin eliquis once INR less than 2    COPD GOLD II  -Currently not wheezing    Recent NSTEMI  -Last admission in April troponin peaked to around 7 and current troponin is 0.03 -I clarified with the patient that she is not having chest pain but was having epigastric pain    Melena/ Acute epigastric pain -Patient reports that for one week she has been having black stools and does not take iron pills -She is anticoagulated and presents with a slightly supratherapeutic INR -Begin Protonix 40 mg IV daily and if fecal occult blood positive will need to increase to every 12 hours -Check fecal occult blood -Check H. pylori serologies    Elevated transaminase level -Based on previous admission patient has had chronically elevated mild AST but normal total bilirubin -Today AST 47 with total bilirubin 2.5 but normal alkaline phosphatase -As precaution check abdominal ultrasound to rule out biliary etiology especially given presentation with right arm and shoulder pain that could be representative of biliary colic -Patient also drinks at least one glass of wine daily and this potentially could be contributing to her elevated AST as well -She also takes a statin but is not reporting any myalgia symptoms but we will check a CK    HLD (hyperlipidemia) -Continue Crestor but follow LFTs and follow-up on CK    GERD -PPI as above    Memory loss -Very minimal and has been able to function appropriately in the independent living setting prior to arrival      DVT prophylaxis: Pharmacy to manage warfarin Code Status: Full code Family Communication:  Son Nancy [name] at the bedside Disposition Plan: Anticipate will discharge back to independent living facility once medically stable Consults called: None  Admission status:  Observation/telemetry    Nancy [name] L. ANP-BC [facility] Pager [phone]   If 7PM-7AM, please contact night-coverage [email or web address] Password [facility]  08/30/2015, 2:44 PM

## 2015-08-30 NOTE — ED Provider Notes (Signed)
CSN: [id number]     Arrival date & time 08/30/15  0940 History   First MD Initiated Contact with Patient 08/30/15 [phone]     Chief Complaint  Patient presents with  . Chest Pain     (Consider location/radiation/quality/duration/timing/severity/associated sxs/prior Treatment) HPI Comments: Patient woke from sleep at 2 AM with right shoulder pain. This pain is constant. It moved to her midsternum at 7 AM. She describes a "someone hitting me". She has sharp jabs coming go and are worse with deep breathing but the underlying pain is constant. States it takes her breath away. Denies nausea or diaphoresis. Given nitroglycerin without relief. On Coumadin for history of atrial fibrillation. Denies any falls or trauma. Denies any focal weakness, numbness or tingling. Denies having similar chest pain previously. No stents in her heart. Did have MI in April 2017 without any blockages on catheterization.  Patient is a 82 y.o. female presenting with chest pain. The history is provided by the patient and the EMS personnel.  Chest Pain Associated symptoms: shortness of breath   Associated symptoms: no abdominal pain, no cough, no dizziness, no fatigue, no headache, no nausea, not vomiting and no weakness     Past Medical History  Diagnosis Date  . Migraine   . GERD (gastroesophageal reflux disease)   . Dyslipidemia   . History of scarlet fever   . PAF (paroxysmal atrial fibrillation) ([place])     a. On Coumadin  . Depression   . Melanoma ([place])   . Mitral regurgitation     a. Echo (11/15):  EF 50-55%, Gr 2 DD, MAC, prolapsing P2 segment of post MV leaflet with mod to possibly severe MR, mild LAE, normal RVF, PASP 35 mmHg;  b. Echo 5/16: Mild LVH, EF 55-60%, trivial AI, moderate MR (LVID, ES 32.3 mm), mild BAE, moderate TR, PASP 48 c. Echo 07/2015:EF 45-50%, moderate to severe MR   . NSTEMI (non-ST elevated myocardial infarction) ([place]) 07/2015    a. Troponin peak of 7.17. Cath w/ normal cors, thought to be  2ry to vasospasm  . CKD (chronic kidney disease), stage III    Past Surgical History  Procedure Laterality Date  . Tonsillectomy    . Appendectomy    . Cataract extraction      bilateral  . Cesarean section    . Cardiac catheterization N/A 07/21/2015    Procedure: Left Heart Cath and Coronary Angiography;  Surgeon: Lorretta Harp, MD;  Location: [facility];  Service: Cardiovascular;  Laterality: N/A;  . Skin biopsy     Family History  Problem Relation Age of Onset  . Colon polyps    . Heart attack Neg Hx   . Stroke Neg Hx   . Hypertension Neg Hx    Social History  Substance Use Topics  . Smoking status: Former Smoker -- 6 years    Types: Cigarettes    Quit date: 09/09/1967  . Smokeless tobacco: None     Comment: pt reports she smoked socially  . Alcohol Use: 4.2 oz/week    7 Glasses of wine per week   OB History    No data available     Review of Systems  Constitutional: Negative for activity change, appetite change and fatigue.  HENT: Negative for congestion and rhinorrhea.   Eyes: Negative for visual disturbance.  Respiratory: Positive for chest tightness and shortness of breath. Negative for cough.   Cardiovascular: Positive for chest pain.  Gastrointestinal: Negative for nausea, vomiting and abdominal  pain.  Genitourinary: Negative for dysuria, hematuria, vaginal bleeding and vaginal discharge.  Musculoskeletal: Negative for arthralgias, neck pain and neck stiffness.  Skin: Negative for rash.  Neurological: Negative for dizziness, weakness and headaches.  A complete 10 system review of systems was obtained and all systems are negative except as noted in the HPI and PMH.      Allergies  Review of patient's allergies indicates no known allergies.  Home Medications   Prior to Admission medications   Medication Sig Start Date End Date Taking? Authorizing Provider  aspirin EC 81 MG EC tablet Take 1 tablet (81 mg total) by mouth daily. 07/22/15    Erma Heritage, PA  calcium-vitamin D (OSCAL WITH D 500-200) 500-200 MG-UNIT per tablet Take 1 tablet by mouth daily.      Historical Provider, MD  Coenzyme Q10 (CO Q 10 PO) Take 100 mg by mouth daily.     Historical Provider, MD  ergocalciferol (VITAMIN D2) 50000 units capsule Take 50,000 Units by mouth once a week.    Historical Provider, MD  furosemide (LASIX) 20 MG tablet TAKE 1 TABLET BY MOUTH EVERY DAY 08/04/15   Sherran Needs, NP  metoprolol tartrate (LOPRESSOR) 25 MG tablet Take 25 mg by mouth 2 (two) times daily. 08/05/15   Historical Provider, MD  Multiple Vitamins-Minerals (CENTRUM SILVER PO) Take 1 tablet by mouth daily.    Historical Provider, MD  nitroGLYCERIN (NITROSTAT) 0.4 MG SL tablet Place 1 tablet (0.4 mg total) under the tongue every 5 (five) minutes x 3 doses as needed for chest pain. 07/22/15   Erma Heritage, PA  rosuvastatin (CRESTOR) 40 MG tablet Take 1 tablet (40 mg total) by mouth daily at 6 PM. 08/11/15   Imogene Burn, PA-C  vitamin B-12 (CYANOCOBALAMIN) 1000 MCG tablet Take 1,000 mcg by mouth every morning.    Historical Provider, MD  warfarin (COUMADIN) 5 MG tablet Take as directed by the [facility]. Will need to take 5mg  (1 tablet) starting tonight for the next 3 days following by 2.5mg  (0.5 tablet) daily until next Coumadin Check. Patient taking differently: Take 2.5 mg by mouth every morning on Sun/Tues/Thurs/Sat and 5 mg on Mon/Wed/Fri (mornings) 07/22/15   Erma Heritage, PA   BP 125/66 mmHg  Temp(Src) 97.6 F (36.4 C) (Oral)  Resp 30  Wt 128 lb (58.06 kg)  SpO2 96% Physical Exam  Constitutional: She is oriented to person, place, and time. She appears well-developed and well-nourished. No distress.  HENT:  Head: Normocephalic and atraumatic.  Mouth/Throat: Oropharynx is clear and moist. No oropharyngeal exudate.  Eyes: Conjunctivae and EOM are normal. Pupils are equal, round, and reactive to light.  Neck: Normal range of motion. Neck supple.   No meningismus.  Cardiovascular: Normal rate, normal heart sounds and intact distal pulses.   No murmur heard. Irregular rhythm  Pulmonary/Chest: Effort normal and breath sounds normal. No respiratory distress.  Abdominal: Soft. There is no tenderness. There is no rebound and no guarding.  Musculoskeletal: Normal range of motion. She exhibits no edema or tenderness.  Neurological: She is alert and oriented to person, place, and time. No cranial nerve deficit. She exhibits normal muscle tone. Coordination normal.  No ataxia on finger to nose bilaterally. No pronator drift. 5/5 strength throughout. CN 2-12 intact.Equal grip strength. Sensation intact.   Skin: Skin is warm.  Psychiatric: She has a normal mood and affect. Her behavior is normal.  Nursing note and vitals reviewed.   ED Course  Procedures (including critical care time) Labs Review Labs Reviewed  CBC WITH DIFFERENTIAL/PLATELET - Abnormal; Notable for the following:    WBC 11.5 (*)    RDW 16.4 (*)    Neutro Abs 9.3 (*)    All other components within normal limits  COMPREHENSIVE METABOLIC PANEL - Abnormal; Notable for the following:    Chloride 93 (*)    Creatinine, Ser 1.63 (*)    AST 47 (*)    Total Bilirubin 2.5 (*)    GFR calc non Af Amer 27 (*)    GFR calc Af Amer 32 (*)    All other components within normal limits  PROTIME-INR - Abnormal; Notable for the following:    Prothrombin Time 34.0 (*)    INR 3.45 (*)    All other components within normal limits  BRAIN NATRIURETIC PEPTIDE - Abnormal; Notable for the following:    B Natriuretic Peptide 1433.0 (*)    All other components within normal limits  TROPONIN I    Imaging Review Dg Chest 2 View  08/30/2015  CLINICAL DATA:  82 year old female with acute onset of right-sided shoulder pain at 2 a.m. yesterday evening. Midsternal chest pain developing at 7 a.m. this morning. Intermittent shortness of breath. EXAM: CHEST  2 VIEW COMPARISON:  Chest x-ray 07/20/2015.  FINDINGS: Trace bilateral pleural effusions. No acute consolidative airspace disease. Mild cephalization of the pulmonary vasculature with indistinct interstitial markings and thickening of the fissures, suggestive of mild interstitial pulmonary edema. Mild cardiomegaly. No pneumothorax. Upper mediastinal contours are within normal limits. Atherosclerosis in the thoracic aorta. IMPRESSION: 1. The appearance the chest suggests very mild congestive heart failure. 2. Atherosclerosis. Electronically Signed   By: Vinnie Langton M.D.   On: 08/30/2015 11:41   I have personally reviewed and evaluated these images and lab results as part of my medical decision-making.   EKG Interpretation   Date/Time:  Saturday Aug 30 2015 09:50:00 EDT Ventricular Rate:  108 PR Interval:    QRS Duration: 92 QT Interval:  364 QTC Calculation: 488 R Axis:   62 Text Interpretation:  Atrial fibrillation Repol abnrm suggests ischemia,  lateral leads worsening inferior and lateral ST depressions Confirmed by  Wyvonnia Dusky  MD, Fedor Kazmierski ([id number]) on 08/30/2015 9:56:33 AM      MDM   Final diagnoses:  Acute on chronic systolic congestive heart failure (HCC)  Chest pain, unspecified chest pain type   Central chest pain constant since 7 AM. It started right shoulder pain around 2 AM. Not reproducible. Somewhat atypical and worse with inspiration. EKG shows worsening ST depressions inferolaterally with T wave inversions.  2-D echo on 07/22/15 EF 45-50% with grade 3 DD and moderate mitral valve prolapse with moderate to severe MR. Cardiac catheterization showed normal coronary arteries w with a very small area of dyskinesis which made the diagnosis of vasospasm more likely  Troponin is negative. INR is therapeutic at 3.4. Doubt pulmonary embolism. Chest x-ray shows mild edema. BNP elevated at 1400. Patient in no respiratory distress.  Case discussed with Dr. Domenic Polite of cardiology. He agrees with recent negative cardiac  catheterization there is no additional cardiac workup necessary. The cath report appears to have a typo as coronaries are "normal" not "abnormal".  Chest pain has resolved.  Still some chest pain with inspiration and dyspnea on exertion but no hypoxia. Will plan admission for diuresis, serial troponins. D/w NP Lissa Merlin.  Will also obtain GB US.  Ezequiel Essex, MD 08/30/15 [phone]

## 2015-08-30 NOTE — ED Notes (Signed)
Ambulated with pulse ox in place, O2 sat remained >96% on room air.

## 2015-08-30 NOTE — ED Notes (Addendum)
Attempted report 

## 2015-08-30 NOTE — ED Notes (Signed)
At 2am had right shoulder pain that became midsternal cp at 0700.  Pain is "like someone hitting me"  Which momentarily takes her breath away.  Denies nausea, diaphoresis.  Has hx a fib

## 2015-08-30 NOTE — Progress Notes (Signed)
Patient arrived in the unit via stretcher from ED. Orientation given to the unit. Patient verbalizes understanding.

## 2015-08-31 DIAGNOSIS — R1013 Epigastric pain: Secondary | ICD-10-CM

## 2015-08-31 DIAGNOSIS — I495 Sick sinus syndrome: Secondary | ICD-10-CM | POA: Diagnosis not present

## 2015-08-31 DIAGNOSIS — I5031 Acute diastolic (congestive) heart failure: Secondary | ICD-10-CM | POA: Diagnosis not present

## 2015-08-31 DIAGNOSIS — I5041 Acute combined systolic (congestive) and diastolic (congestive) heart failure: Secondary | ICD-10-CM | POA: Diagnosis not present

## 2015-08-31 LAB — URINE CULTURE

## 2015-08-31 LAB — COMPREHENSIVE METABOLIC PANEL
ALBUMIN: 3.2 g/dL — AB (ref 3.5–5.0)
ALT: 29 U/L (ref 14–54)
ANION GAP: 14 (ref 5–15)
AST: 41 U/L (ref 15–41)
Alkaline Phosphatase: 77 U/L (ref 38–126)
BILIRUBIN TOTAL: 3.4 mg/dL — AB (ref 0.3–1.2)
BUN: 19 mg/dL (ref 6–20)
CO2: 25 mmol/L (ref 22–32)
Calcium: 9.4 mg/dL (ref 8.9–10.3)
Chloride: 98 mmol/L — ABNORMAL LOW (ref 101–111)
Creatinine, Ser: 1.73 mg/dL — ABNORMAL HIGH (ref 0.44–1.00)
GFR calc non Af Amer: 25 mL/min — ABNORMAL LOW (ref >60)
GFR, EST AFRICAN AMERICAN: 29 mL/min — AB (ref >60)
GLUCOSE: 74 mg/dL (ref 65–99)
POTASSIUM: 4 mmol/L (ref 3.5–5.1)
SODIUM: 137 mmol/L (ref 135–145)
TOTAL PROTEIN: 6.7 g/dL (ref 6.5–8.1)

## 2015-08-31 LAB — CBC
HEMATOCRIT: 43.4 % (ref 36.0–46.0)
Hemoglobin: 13.6 g/dL (ref 12.0–15.0)
MCH: 30.6 pg (ref 26.0–34.0)
MCHC: 31.3 g/dL (ref 30.0–36.0)
MCV: 97.5 fL (ref 78.0–100.0)
Platelets: 237 10*3/uL (ref 150–400)
RBC: 4.45 MIL/uL (ref 3.87–5.11)
RDW: 16.6 % — ABNORMAL HIGH (ref 11.5–15.5)
WBC: 10.2 10*3/uL (ref 4.0–10.5)

## 2015-08-31 LAB — PROTIME-INR
INR: 4.56 — ABNORMAL HIGH (ref 0.00–1.49)
Prothrombin Time: 42 seconds — ABNORMAL HIGH (ref 11.6–15.2)

## 2015-08-31 LAB — TROPONIN I: Troponin I: 0.05 ng/mL — ABNORMAL HIGH (ref <0.031)

## 2015-08-31 MED ORDER — PANTOPRAZOLE SODIUM 40 MG PO TBEC: 40.0000 mg | DELAYED_RELEASE_TABLET | Freq: Every day | ORAL | Status: DC

## 2015-08-31 MED ORDER — FUROSEMIDE 10 MG/ML IJ SOLN
40.0000 mg | Freq: Once | INTRAMUSCULAR | Status: AC
  Administered 2015-08-31: 40 mg via INTRAVENOUS
  Filled 2015-08-31: qty 4

## 2015-08-31 MED ORDER — METOPROLOL TARTRATE 25 MG PO TABS
37.5000 mg | ORAL_TABLET | Freq: Two times a day (BID) | ORAL | Status: DC
  Administered 2015-08-31: 37.5 mg via ORAL
  Filled 2015-08-31: qty 1

## 2015-08-31 MED ORDER — FUROSEMIDE 20 MG PO TABS: 20.0000 mg | ORAL_TABLET | Freq: Every day | ORAL | Status: DC

## 2015-08-31 MED ORDER — METOPROLOL TARTRATE 5 MG/5ML IV SOLN
5.0000 mg | Freq: Three times a day (TID) | INTRAVENOUS | Status: DC | PRN
  Administered 2015-08-31: 5 mg via INTRAVENOUS
  Filled 2015-08-31: qty 5

## 2015-08-31 MED ORDER — METOPROLOL TARTRATE 37.5 MG PO TABS: 37.5000 mg | ORAL_TABLET | Freq: Two times a day (BID) | ORAL | Status: AC

## 2015-08-31 NOTE — Discharge Summary (Addendum)
Physician Discharge Summary  Minette Manders MRN: [id number] DOB/AGE: December 14, 1925 82 y.o.  PCP: [name], MD   Admit date: 08/30/2015 Discharge date: 08/31/2015  Discharge Diagnoses:   Principal Problem:   Acute combined systolic (EF 09%) and grade 3 diastolic heart failure (HCC) Active Problems:   HLD (hyperlipidemia)   Atrial fibrillation (HCC)   COPD GOLD II    GERD   Memory loss   Mitral regurgitation   Recent NSTEMI    Melena   Moderate to severe pulmonary hypertension (HCC)   Acute epigastric pain   Elevated transaminase level   Acute diastolic heart failure, NYHA class 1 (HCC)   Follow-up recommendations Daily INR checks until INR is within the therapeutic range   Follow-up with PCP in 3-5 days , including all  additional recommended appointments as below Follow-up CBC, CMP in 3-5 days       Current Discharge Medication List    START taking these medications   Details  pantoprazole (PROTONIX) 40 MG tablet Take 1 tablet (40 mg total) by mouth daily. Qty: 30 tablet, Refills: 1      CONTINUE these medications which have CHANGED   Details  furosemide (LASIX) 20 MG tablet Take 1 tablet (20 mg total) by mouth daily. Qty: 30 tablet, Refills: 6    metoprolol tartrate 37.5 MG TABS Take 37.5 mg by mouth 2 (two) times daily. Qty: 60 tablet, Refills: 1      CONTINUE these medications which have NOT CHANGED   Details  aspirin EC 81 MG EC tablet Take 1 tablet (81 mg total) by mouth daily.    calcium-vitamin D (OSCAL WITH D 500-200) 500-200 MG-UNIT per tablet Take 1 tablet by mouth daily.      Coenzyme Q10 (CO Q 10 PO) Take 100 mg by mouth daily.     ergocalciferol (VITAMIN D2) 50000 units capsule Take 50,000 Units by mouth once a week.    Multiple Vitamins-Minerals (CENTRUM SILVER PO) Take 1 tablet by mouth daily.    rosuvastatin (CRESTOR) 40 MG tablet Take 1 tablet (40 mg total) by mouth daily at 6 PM. Qty: 30 tablet, Refills: 6    vitamin B-12  (CYANOCOBALAMIN) 1000 MCG tablet Take 1,000 mcg by mouth every morning.    warfarin (COUMADIN) 5 MG tablet Take as directed by the [facility]. Will need to take '5mg'$  (1 tablet) starting tonight for the next 3 days following by 2.'5mg'$  (0.5 tablet) daily until next Coumadin Check. Qty: 50 tablet, Refills: 1    nitroGLYCERIN (NITROSTAT) 0.4 MG SL tablet Place 1 tablet (0.4 mg total) under the tongue every 5 (five) minutes x 3 doses as needed for chest pain. Qty: 25 tablet, Refills: 3         Discharge Condition: Stable   Discharge Instructions Get Medicines reviewed and adjusted: Please take all your medications with you for your next visit with your Primary MD  Please request your Primary MD to go over all hospital tests and procedure/radiological results at the follow up, please ask your Primary MD to get all Hospital records sent to his/her office.  If you experience worsening of your admission symptoms, develop shortness of breath, life threatening emergency, suicidal or homicidal thoughts you must seek medical attention immediately by calling 911 or calling your MD immediately if symptoms less severe.  You must read complete instructions/literature along with all the possible adverse reactions/side effects for all the Medicines you take and that have been prescribed to you. Take any new Medicines  after you have completely understood and accpet all the possible adverse reactions/side effects.   Do not drive when taking Pain medications.   Do not take more than prescribed Pain, Sleep and Anxiety Medications  Special Instructions: If you have smoked or chewed Tobacco in the last 2 yrs please stop smoking, stop any regular Alcohol and or any Recreational drug use.  Wear Seat belts while driving.  Please note  You were cared for by a hospitalist during your hospital stay. Once you are discharged, your primary care physician will handle any further medical issues. Please note  that NO REFILLS for any discharge medications will be authorized once you are discharged, as it is imperative that you return to your primary care physician (or establish a relationship with a primary care physician if you do not have one) for your aftercare needs so that they can reassess your need for medications and monitor your lab values.  Discharge Instructions    Diet - low sodium heart healthy    Complete by:  As directed      Increase activity slowly    Complete by:  As directed             No Known Allergies    Disposition: 01-Home or Self Care   Consults:  None     Significant Diagnostic Studies:  Dg Chest 2 View  08/30/2015  CLINICAL DATA:  82 year old female with acute onset of right-sided shoulder pain at 2 a.m. yesterday evening. Midsternal chest pain developing at 7 a.m. this morning. Intermittent shortness of breath. EXAM: CHEST  2 VIEW COMPARISON:  Chest x-ray 07/20/2015. FINDINGS: Trace bilateral pleural effusions. No acute consolidative airspace disease. Mild cephalization of the pulmonary vasculature with indistinct interstitial markings and thickening of the fissures, suggestive of mild interstitial pulmonary edema. Mild cardiomegaly. No pneumothorax. Upper mediastinal contours are within normal limits. Atherosclerosis in the thoracic aorta. IMPRESSION: 1. The appearance the chest suggests very mild congestive heart failure. 2. Atherosclerosis. Electronically Signed   By: Vinnie Langton M.D.   On: 08/30/2015 11:41   Mr Brain Wo Contrast  08/18/2015  CLINICAL DATA:  Balance problems. EXAM: MRI HEAD WITHOUT CONTRAST TECHNIQUE: Multiplanar, multiecho pulse sequences of the brain and surrounding structures were obtained without intravenous contrast. COMPARISON:  MRI head 02/16/2006 FINDINGS: Moderate atrophy has progressed in the interval. Ventricular enlargement has progressed due to atrophy. Negative for acute infarct. Chronic white matter hyperintensities  throughout both cerebral hemispheres have progressed. This is due to chronic microvascular ischemic change. Mild chronic ischemic change in the pons bilaterally. Negative for intracranial hemorrhage. Negative for mass or edema. No shift of the midline structures. Pituitary normal in size.  Normal skullbase. Mucosal edema in the left sphenoid sinus with air-fluid level. Circle of Willis patent. IMPRESSION: Progression of atrophy and chronic microvascular ischemia since 2007 No acute infarct or mass Sphenoid sinusitis with air-fluid level. Electronically Signed   By: Franchot Gallo M.D.   On: 08/18/2015 20:46   US Abdomen Complete  08/30/2015  CLINICAL DATA:  Patient with chest pain and right shoulder pain. EXAM: ABDOMEN ULTRASOUND COMPLETE COMPARISON:  Chest CT 01/06/2015 FINDINGS: Gallbladder: There is a nonmobile shadowing stone within the gallbladder lumen and associated sludge. No gallbladder wall thickening or pericholecystic fluid. Negative sonographic Murphy sign. Common bile duct: Diameter: 3 mm Liver: No focal lesion identified. Within normal limits in parenchymal echogenicity. IVC: No abnormality visualized. Pancreas: Visualized portion unremarkable. Spleen: Size and appearance within normal limits. Right Kidney: Length: 8.8  cm. Increased renal cortical echogenicity. Normal renal cortical thickness. No hydronephrosis. Left Kidney: Length: 8.6 cm. Increased renal cortical echogenicity. Normal renal cortical thickness. No hydronephrosis. Abdominal aorta: No aneurysm visualized. Other findings: None. IMPRESSION: Cholelithiasis without sonographic evidence for acute cholecystitis. Increased renal cortical echogenicity as can be seen with chronic medical renal disease. No hydronephrosis. Electronically Signed   By: Lovey Newcomer M.D.   On: 08/30/2015 20:37        Filed Weights   08/30/15 0953 08/30/15 1507 08/31/15 0541  Weight: 58.06 kg (128 lb) 58.378 kg (128 lb 11.2 oz) 57.425 kg (126 lb 9.6 oz)      Microbiology: Recent Results (from the past 240 hour(s))  MRSA PCR Screening     Status: None   Collection Time: 08/30/15  7:58 PM  Result Value Ref Range Status   MRSA by PCR NEGATIVE NEGATIVE Final    Comment:        The GeneXpert MRSA Assay (FDA approved for NASAL specimens only), is one component of a comprehensive MRSA colonization surveillance program. It is not intended to diagnose MRSA infection nor to guide or monitor treatment for MRSA infections.        Blood Culture No results found for: SDES, [place], CULT, REPTSTATUS    Labs: Results for orders placed or performed during the hospital encounter of 08/30/15 (from the past 48 hour(s))  CBC with Differential/Platelet     Status: Abnormal   Collection Time: 08/30/15 10:20 AM  Result Value Ref Range   WBC 11.5 (H) 4.0 - 10.5 K/uL   RBC 4.49 3.87 - 5.11 MIL/uL   Hemoglobin 14.2 12.0 - 15.0 g/dL   HCT 44.3 36.0 - 46.0 %   MCV 98.7 78.0 - 100.0 fL   MCH 31.6 26.0 - 34.0 pg   MCHC 32.1 30.0 - 36.0 g/dL   RDW 16.4 (H) 11.5 - 15.5 %   Platelets 257 150 - 400 K/uL   Neutrophils Relative % 81 %   Neutro Abs 9.3 (H) 1.7 - 7.7 K/uL   Lymphocytes Relative 12 %   Lymphs Abs 1.4 0.7 - 4.0 K/uL   Monocytes Relative 6 %   Monocytes Absolute 0.7 0.1 - 1.0 K/uL   Eosinophils Relative 1 %   Eosinophils Absolute 0.1 0.0 - 0.7 K/uL   Basophils Relative 0 %   Basophils Absolute 0.0 0.0 - 0.1 K/uL  Comprehensive metabolic panel     Status: Abnormal   Collection Time: 08/30/15 10:20 AM  Result Value Ref Range   Sodium 135 135 - 145 mmol/L   Potassium 3.8 3.5 - 5.1 mmol/L   Chloride 93 (L) 101 - 111 mmol/L   CO2 28 22 - 32 mmol/L   Glucose, Bld 95 65 - 99 mg/dL   BUN 17 6 - 20 mg/dL   Creatinine, Ser 1.63 (H) 0.44 - 1.00 mg/dL   Calcium 9.0 8.9 - 10.3 mg/dL   Total Protein 7.1 6.5 - 8.1 g/dL   Albumin 3.5 3.5 - 5.0 g/dL   AST 47 (H) 15 - 41 U/L   ALT 31 14 - 54 U/L   Alkaline Phosphatase 86 38 - 126 U/L    Total Bilirubin 2.5 (H) 0.3 - 1.2 mg/dL   GFR calc non Af Amer 27 (L) >60 mL/min   GFR calc Af Amer 32 (L) >60 mL/min    Comment: (NOTE) The eGFR has been calculated using the CKD EPI equation. This calculation has not been validated in all clinical situations.  eGFR's persistently <60 mL/min signify possible Chronic Kidney Disease.    Anion gap 14 5 - 15  Troponin I     Status: None   Collection Time: 08/30/15 10:20 AM  Result Value Ref Range   Troponin I 0.03 <0.031 ng/mL    Comment:        NO INDICATION OF MYOCARDIAL INJURY.   Protime-INR     Status: Abnormal   Collection Time: 08/30/15 10:20 AM  Result Value Ref Range   Prothrombin Time 34.0 (H) 11.6 - 15.2 seconds   INR 3.45 (H) 0.00 - 1.49  Brain natriuretic peptide     Status: Abnormal   Collection Time: 08/30/15 10:20 AM  Result Value Ref Range   B Natriuretic Peptide 1433.0 (H) 0.0 - 100.0 pg/mL  Lipase, blood     Status: None   Collection Time: 08/30/15  2:33 PM  Result Value Ref Range   Lipase 25 11 - 51 U/L  CK     Status: None   Collection Time: 08/30/15  4:17 PM  Result Value Ref Range   Total CK 63 38 - 234 U/L  Glucose, capillary     Status: None   Collection Time: 08/30/15  4:35 PM  Result Value Ref Range   Glucose-Capillary 94 65 - 99 mg/dL  Urinalysis, Routine w reflex microscopic (not at [facility])     Status: None   Collection Time: 08/30/15  5:40 PM  Result Value Ref Range   Color, Urine YELLOW YELLOW   APPearance CLEAR CLEAR   Specific Gravity, Urine 1.012 1.005 - 1.030   pH 7.0 5.0 - 8.0   Glucose, UA NEGATIVE NEGATIVE mg/dL   Hgb urine dipstick NEGATIVE NEGATIVE   Bilirubin Urine NEGATIVE NEGATIVE   Ketones, ur NEGATIVE NEGATIVE mg/dL   Protein, ur NEGATIVE NEGATIVE mg/dL   Nitrite NEGATIVE NEGATIVE   Leukocytes, UA NEGATIVE NEGATIVE    Comment: MICROSCOPIC NOT DONE ON URINES WITH NEGATIVE PROTEIN, BLOOD, LEUKOCYTES, NITRITE, OR GLUCOSE <1000 mg/dL.  MRSA PCR Screening     Status: None    Collection Time: 08/30/15  7:58 PM  Result Value Ref Range   MRSA by PCR NEGATIVE NEGATIVE    Comment:        The GeneXpert MRSA Assay (FDA approved for NASAL specimens only), is one component of a comprehensive MRSA colonization surveillance program. It is not intended to diagnose MRSA infection nor to guide or monitor treatment for MRSA infections.   Comprehensive metabolic panel     Status: Abnormal   Collection Time: 08/31/15  4:33 AM  Result Value Ref Range   Sodium 137 135 - 145 mmol/L   Potassium 4.0 3.5 - 5.1 mmol/L   Chloride 98 (L) 101 - 111 mmol/L   CO2 25 22 - 32 mmol/L   Glucose, Bld 74 65 - 99 mg/dL   BUN 19 6 - 20 mg/dL   Creatinine, Ser 1.73 (H) 0.44 - 1.00 mg/dL   Calcium 9.4 8.9 - 10.3 mg/dL   Total Protein 6.7 6.5 - 8.1 g/dL   Albumin 3.2 (L) 3.5 - 5.0 g/dL   AST 41 15 - 41 U/L   ALT 29 14 - 54 U/L   Alkaline Phosphatase 77 38 - 126 U/L   Total Bilirubin 3.4 (H) 0.3 - 1.2 mg/dL   GFR calc non Af Amer 25 (L) >60 mL/min   GFR calc Af Amer 29 (L) >60 mL/min    Comment: (NOTE) The eGFR has been calculated using the  CKD EPI equation. This calculation has not been validated in all clinical situations. eGFR's persistently <60 mL/min signify possible Chronic Kidney Disease.    Anion gap 14 5 - 15  CBC     Status: Abnormal   Collection Time: 08/31/15  4:33 AM  Result Value Ref Range   WBC 10.2 4.0 - 10.5 K/uL   RBC 4.45 3.87 - 5.11 MIL/uL   Hemoglobin 13.6 12.0 - 15.0 g/dL   HCT 02.7 39.2 - 30.4 %   MCV 97.5 78.0 - 100.0 fL   MCH 30.6 26.0 - 34.0 pg   MCHC 31.3 30.0 - 36.0 g/dL   RDW 26.0 (H) 86.0 - 24.6 %   Platelets 237 150 - 400 K/uL  Protime-INR     Status: Abnormal   Collection Time: 08/31/15  4:33 AM  Result Value Ref Range   Prothrombin Time 42.0 (H) 11.6 - 15.2 seconds   INR 4.56 (H) 0.00 - 1.49  Troponin I     Status: Abnormal   Collection Time: 08/31/15  8:35 AM  Result Value Ref Range   Troponin I 0.05 (H) <0.031 ng/mL    Comment:         PERSISTENTLY INCREASED TROPONIN VALUES IN THE RANGE OF 0.04-0.49 ng/mL CAN BE SEEN IN:       -UNSTABLE ANGINA       -CONGESTIVE HEART FAILURE       -MYOCARDITIS       -CHEST TRAUMA       -ARRYHTHMIAS       -LATE PRESENTING MYOCARDIAL INFARCTION       -COPD   CLINICAL FOLLOW-UP RECOMMENDED.      Lipid Panel  No results found for: CHOL, TRIG, HDL, CHOLHDL, VLDL, LDLCALC, LDLDIRECT   No results found for: HGBA1C   Lab Results  Component Value Date   CREATININE 1.73* 08/31/2015       82 y.o. female with medical history significant for paroxysmal atrial fibrillation on Coumadin, moderate mitral regurgitation, stage III chronic kidney disease, dyslipidemia, known diastolic heart failure with moderate pulmonary hypertension, and recent admission last April for non-STEMI. Patient found to have normal coronary arteries on cardiac catheterization and it was felt her cardiac event was related to vasospasm. Since discharge patient has presented to the ER with 1 additional time on 5/15 for diarrhea that lasted a total of 2 days and was not associated with abdominal pain or any bleeding although was yellow in color at one point.   she presented  to the ER after awakening during the night around 2 AM with significant right shoulder pain. She took an aspirin and went back to sleep. When she awakened around 8:00 this morning she was having epigastric discomfort that increased with taking deep breath. She was also complaining of mid thoracic back pain with taking deep breath. She was given nitroglycerin without any relief of her symptoms. Her troponin was normal in the ER and EKG essentially unchanged from previous. Patient noted to have increased perivascular congestion on chest x-ray  HOSPITAL COURSE:    Acute combined systolic (EF 45%) and grade 3 diastolic heart failure/ Mitral regurgitation/ Moderate to severe pulmonary hypertension  -Patient presented with chest discomfort and chest x-ray  concerning for possible very mild heart failure exacerbation; she has no hypoxemia or reported shortness of breath -Was given a one-time dose of Lasix 40 mg IV on the day of discharge, patient to continue Lasix 20 mg a day, currently 98% on room air Suspect mild  CHF exacerbation secondary to A. fib with RVR A. fib with RVR likely contributing to her symptoms -Continue preadmission Lasix and increase the dose of metoprolol Discussed management with Herminio Commons, MD prior to discharge by telephone   Atrial fibrillation with tachybradycardia syndrome Rate somewhat uncontrolled, increase metoprolol to 37.5 mg twice a day May need to cut back again if the patient becomes bradycardic -CHADVASc= 5 -On warfarin but both patient and her son who would like to transition to NOAC ,start eliquis per pharmacy at [facility] Family and patient eager to be discharged back to SNF today therefore this transition was not made in the hospital    COPD GOLD II  -Currently not wheezing   Recent NSTEMI  -Last admission in April troponin peaked to around 7 and current troponin is 0.03 Patient's chest pain likely secondary to diffuse esophageal spasm and the patient has been started on Protonix   Melena/ Acute epigastric pain -Patient reports that for one week she has been having black stools and does not take iron pills -She is anticoagulated and presents with a slightly supratherapeutic INR, she also takes aspirin Chest pain likely secondary to diffuse esophageal spasm Follow H pylori serologies    Elevated transaminase level -Based on previous admission patient has had chronically elevated mild AST but normal total bilirubin -Today AST 47 with total bilirubin 2.5 but normal alkaline phosphatase Abdominal ultrasound showed cholelithiasis without any evidence of acute cholecystitis -Patient also drinks at least one glass of wine daily and this potentially could be contributing to her elevated AST as  well     HLD (hyperlipidemia) -Continue Crestor but follow LFTs     GERD -PPI as above   Memory loss -Very minimal and has been able to function appropriately in the independent living setting prior to arrival     Discharge Exam: *   Blood pressure 119/86, pulse 115, temperature 98 F (36.7 C), temperature source Oral, resp. rate 18, height '5\' 6"'$  (1.676 m), weight 57.425 kg (126 lb 9.6 oz), SpO2 98 %.  General exam: Appears calm and comfortable  Respiratory system: Clear to auscultation. Respiratory effort normal. Cardiovascular system: S1 & S2 heard, RRR. No JVD, murmurs, rubs, gallops or clicks. No pedal edema. Gastrointestinal system: Abdomen is nondistended, soft and nontender. No organomegaly or masses felt. Normal bowel sounds heard. Central nervous system: Alert and oriented. No focal neurological deficits. Extremities: Symmetric 5 x 5 power. Skin: No rashes, lesions or ulcers Psychiatry: Judgement and insight appear normal. Mood & affect appropriate    Follow-up Information    Follow up with [name], MD. Schedule an appointment as soon as possible for a visit in 3 days.   Specialty:  Internal Medicine   Contact information:   [place] [id number] [phone]       Follow up with Thompson Grayer, MD. Schedule an appointment as soon as possible for a visit in 1 week.   Specialty:  Cardiology   Why:  HOSPITAL FOLLOW UP    Contact information:   [address] [place] [address] [phone]       Signed: Reyne Dumas 08/31/2015, 11:16 AM        Time spent >45 mins

## 2015-08-31 NOTE — Progress Notes (Signed)
Patient is discharge to home accompanied by patient's brother and NT via wheelchair. Discharge instructions given . Patient verbalizes understanding. All personal belongings given. Telemetry box and IV removed prior to discharge and site in good condition.

## 2015-08-31 NOTE — Progress Notes (Signed)
[facility] PROGRESS NOTE  Nancy [name] [id number] DOB: 1926/02/27 DOA: 08/30/2015   PCP: [name], MD     Assessment/Plan: Principal Problem:   Acute combined systolic (EF [id number]) and grade 3 diastolic heart failure (HCC) Active Problems:   HLD (hyperlipidemia)   Atrial fibrillation (HCC)   COPD GOLD II    GERD   Memory loss   Mitral regurgitation   Recent NSTEMI    Melena   Moderate to severe pulmonary hypertension (HCC)   Acute epigastric pain   Elevated transaminase level   Acute diastolic heart failure, NYHA class 1 ([place])   82 y.o. female with medical history significant for paroxysmal atrial fibrillation on Coumadin, moderate mitral regurgitation, stage III chronic kidney disease, dyslipidemia, known diastolic heart failure with moderate pulmonary hypertension, and recent admission last April for non-STEMI. Patient found to have normal coronary arteries on cardiac catheterization and it was felt her cardiac event was related to vasospasm. Since discharge patient has presented to the ER with 1 additional time on 5/15 for diarrhea that lasted a total of 2 days and was not associated with abdominal pain or any bleeding although was yellow in color at one point. She presented to the ER after awakening during the night around 2 AM with significant right shoulder pain. She took an aspirin and went back to sleep. When she awakened around 8:00 this morning she was having epigastric discomfort that increased with taking deep breath. She was also complaining of mid thoracic back pain with taking deep breath. She was given nitroglycerin without any relief of her symptoms. Her troponin was normal in the ER and EKG essentially unchanged from previous noting she has some inferolateral ST segment changes that are chronic with the totality of recent EKGs have been evaluated. Patient admitted for evaluation of chest pain  Assessment and plan  Acute combined systolic (EF [id number]) and  grade 3 diastolic heart failure/ Mitral regurgitation/ Moderate to severe pulmonary hypertension  -Patient presented with chest discomfort and chest x-ray concerning for possible very mild heart failure exacerbation; she has no hypoxemia or reported shortness of breath -Was given a one-time dose of Lasix in the ER, continue Lasix 20 mg a day, currently 98% on room air Suspect mild CHF exacerbation secondary to A. fib with RVR A. fib with RVR likely contributing to her symptoms -Continue preadmission Lasix and increase the dose of metoprolol     Atrial fibrillation with tachybradycardia syndrome Rate somewhat uncontrolled, increase metoprolol to 37.5 mg twice a day May need to cut back again if the patient becomes bradycardic -CHADVASc= 5 -On warfarin but both patient and her son who would like to transition to NOAC ,start eliquis per pharmacy    COPD GOLD II  -Currently not wheezing   Recent NSTEMI  -Last admission in April troponin peaked to around 7 and current troponin is 0.03 -I clarified with the patient that she is not having chest pain but was having epigastric pain   Melena/ Acute epigastric pain -Patient reports that for one week she has been having black stools and does not take iron pills -She is anticoagulated and presents with a slightly supratherapeutic INR Continue Protonix 40 mg IV daily , hemoglobin stable  -Check fecal occult blood -Check H. pylori serologies   Elevated transaminase level -Based on previous admission patient has had chronically elevated mild AST but normal total bilirubin -Today AST 47 with total bilirubin 2.5 but normal alkaline phosphatase -As precaution check  abdominal ultrasound to rule out biliary etiology especially given presentation with right arm and shoulder pain that could be representative of biliary colic -Patient also drinks at least one glass of wine daily and this potentially could be contributing to her elevated AST as  well -She also takes a statin but is not reporting any myalgia symptoms but we will check a CK   HLD (hyperlipidemia) -Continue Crestor but follow LFTs and follow-up on CK   GERD -PPI as above   Memory loss -Very minimal and has been able to function appropriately in the independent living setting prior to arrival     DVT prophylaxsis Coumadin  Code Status:  Full code    Family Communication: Discussed in detail with the patient, all imaging results, lab results explained to the patient   Disposition Plan:  Anticipate discharge tomorrow when Heart rate is better controlled     Consultants:  None  Procedures:  None  Antibiotics: Anti-infectives    None         HPI/Subjective: Patient continues to be tachycardic at rest, denies any chest pain or shortness of breath  Objective: Filed Vitals:   08/30/15 1922 08/30/15 1925 08/30/15 2354 08/31/15 0541  BP: 111/65 111/66 119/79 119/86  Pulse: 109 73 93 115  Temp:   98.4 F (36.9 C) 98 F (36.7 C)  TempSrc:   Oral Oral  Resp:   16 18  Height:      Weight:    57.425 kg (126 lb 9.6 oz)  SpO2:   97% 98%    Intake/Output Summary (Last 24 hours) at 08/31/15 0817 Last data filed at 08/31/15 0300  Gross per 24 hour  Intake    103 ml  Output    375 ml  Net   -272 ml    Exam:  Examination:  General exam: Appears calm and comfortable  Respiratory system: Clear to auscultation. Respiratory effort normal. Cardiovascular system: S1 & S2 heard, RRR. No JVD, murmurs, rubs, gallops or clicks. No pedal edema. Gastrointestinal system: Abdomen is nondistended, soft and nontender. No organomegaly or masses felt. Normal bowel sounds heard. Central nervous system: Alert and oriented. No focal neurological deficits. Extremities: Symmetric 5 x 5 power. Skin: No rashes, lesions or ulcers Psychiatry: Judgement and insight appear normal. Mood & affect appropriate.     Data Reviewed: I have personally reviewed  following labs and imaging studies  Micro Results Recent Results (from the past 240 hour(s))  MRSA PCR Screening     Status: None   Collection Time: 08/30/15  7:58 PM  Result Value Ref Range Status   MRSA by PCR NEGATIVE NEGATIVE Final    Comment:        The GeneXpert MRSA Assay (FDA approved for NASAL specimens only), is one component of a comprehensive MRSA colonization surveillance program. It is not intended to diagnose MRSA infection nor to guide or monitor treatment for MRSA infections.     Radiology Reports Dg Chest 2 View  08/30/2015  CLINICAL DATA:  82 year old female with acute onset of right-sided shoulder pain at 2 a.m. yesterday evening. Midsternal chest pain developing at 7 a.m. this morning. Intermittent shortness of breath. EXAM: CHEST  2 VIEW COMPARISON:  Chest x-ray 07/20/2015. FINDINGS: Trace bilateral pleural effusions. No acute consolidative airspace disease. Mild cephalization of the pulmonary vasculature with indistinct interstitial markings and thickening of the fissures, suggestive of mild interstitial pulmonary edema. Mild cardiomegaly. No pneumothorax. Upper mediastinal contours are within normal limits. Atherosclerosis in the  thoracic aorta. IMPRESSION: 1. The appearance the chest suggests very mild congestive heart failure. 2. Atherosclerosis. Electronically Signed   By: Vinnie Langton M.D.   On: 08/30/2015 11:41   Mr Brain Wo Contrast  08/18/2015  CLINICAL DATA:  Balance problems. EXAM: MRI HEAD WITHOUT CONTRAST TECHNIQUE: Multiplanar, multiecho pulse sequences of the brain and surrounding structures were obtained without intravenous contrast. COMPARISON:  MRI head 02/16/2006 FINDINGS: Moderate atrophy has progressed in the interval. Ventricular enlargement has progressed due to atrophy. Negative for acute infarct. Chronic white matter hyperintensities throughout both cerebral hemispheres have progressed. This is due to chronic microvascular ischemic change.  Mild chronic ischemic change in the pons bilaterally. Negative for intracranial hemorrhage. Negative for mass or edema. No shift of the midline structures. Pituitary normal in size.  Normal skullbase. Mucosal edema in the left sphenoid sinus with air-fluid level. Circle of Willis patent. IMPRESSION: Progression of atrophy and chronic microvascular ischemia since 2007 No acute infarct or mass Sphenoid sinusitis with air-fluid level. Electronically Signed   By: Franchot Gallo M.D.   On: 08/18/2015 20:46   US Abdomen Complete  08/30/2015  CLINICAL DATA:  Patient with chest pain and right shoulder pain. EXAM: ABDOMEN ULTRASOUND COMPLETE COMPARISON:  Chest CT 01/06/2015 FINDINGS: Gallbladder: There is a nonmobile shadowing stone within the gallbladder lumen and associated sludge. No gallbladder wall thickening or pericholecystic fluid. Negative sonographic Murphy sign. Common bile duct: Diameter: 3 mm Liver: No focal lesion identified. Within normal limits in parenchymal echogenicity. IVC: No abnormality visualized. Pancreas: Visualized portion unremarkable. Spleen: Size and appearance within normal limits. Right Kidney: Length: 8.8 cm. Increased renal cortical echogenicity. Normal renal cortical thickness. No hydronephrosis. Left Kidney: Length: 8.6 cm. Increased renal cortical echogenicity. Normal renal cortical thickness. No hydronephrosis. Abdominal aorta: No aneurysm visualized. Other findings: None. IMPRESSION: Cholelithiasis without sonographic evidence for acute cholecystitis. Increased renal cortical echogenicity as can be seen with chronic medical renal disease. No hydronephrosis. Electronically Signed   By: Lovey Newcomer M.D.   On: 08/30/2015 20:37     CBC  Recent Labs Lab 08/30/15 1020 08/31/15 0433  WBC 11.5* 10.2  HGB 14.2 13.6  HCT 44.3 43.4  PLT 257 237  MCV 98.7 97.5  MCH 31.6 30.6  MCHC 32.1 31.3  RDW 16.4* 16.6*  LYMPHSABS 1.4  --   MONOABS 0.7  --   EOSABS 0.1  --   BASOSABS 0.0   --     Chemistries   Recent Labs Lab 08/30/15 1020 08/31/15 0433  NA 135 137  K 3.8 4.0  CL 93* 98*  CO2 28 25  GLUCOSE 95 74  BUN 17 19  CREATININE 1.63* 1.73*  CALCIUM 9.0 9.4  AST 47* 41  ALT 31 29  ALKPHOS 86 77  BILITOT 2.5* 3.4*   ------------------------------------------------------------------------------------------------------------------ estimated creatinine clearance is 20.8 mL/min (by C-G formula based on Cr of 1.73). ------------------------------------------------------------------------------------------------------------------ No results for input(s): HGBA1C in the last 72 hours. ------------------------------------------------------------------------------------------------------------------ No results for input(s): CHOL, HDL, LDLCALC, TRIG, CHOLHDL, LDLDIRECT in the last 72 hours. ------------------------------------------------------------------------------------------------------------------ No results for input(s): TSH, T4TOTAL, T3FREE, THYROIDAB in the last 72 hours.  Invalid input(s): FREET3 ------------------------------------------------------------------------------------------------------------------ No results for input(s): VITAMINB12, FOLATE, FERRITIN, TIBC, IRON, RETICCTPCT in the last 72 hours.  Coagulation profile  Recent Labs Lab 08/30/15 1020 08/31/15 0433  INR 3.45* 4.56*    No results for input(s): DDIMER in the last 72 hours.  Cardiac Enzymes  Recent Labs Lab 08/30/15 1020  TROPONINI 0.03   ------------------------------------------------------------------------------------------------------------------ Mertha Finders  input(s): POCBNP   CBG:  Recent Labs Lab 08/30/15 1635  GLUCAP 94       Studies: Dg Chest 2 View  08/30/2015  CLINICAL DATA:  82 year old female with acute onset of right-sided shoulder pain at 2 a.m. yesterday evening. Midsternal chest pain developing at 7 a.m. this morning. Intermittent shortness of  breath. EXAM: CHEST  2 VIEW COMPARISON:  Chest x-ray 07/20/2015. FINDINGS: Trace bilateral pleural effusions. No acute consolidative airspace disease. Mild cephalization of the pulmonary vasculature with indistinct interstitial markings and thickening of the fissures, suggestive of mild interstitial pulmonary edema. Mild cardiomegaly. No pneumothorax. Upper mediastinal contours are within normal limits. Atherosclerosis in the thoracic aorta. IMPRESSION: 1. The appearance the chest suggests very mild congestive heart failure. 2. Atherosclerosis. Electronically Signed   By: Vinnie Langton M.D.   On: 08/30/2015 11:41   US Abdomen Complete  08/30/2015  CLINICAL DATA:  Patient with chest pain and right shoulder pain. EXAM: ABDOMEN ULTRASOUND COMPLETE COMPARISON:  Chest CT 01/06/2015 FINDINGS: Gallbladder: There is a nonmobile shadowing stone within the gallbladder lumen and associated sludge. No gallbladder wall thickening or pericholecystic fluid. Negative sonographic Murphy sign. Common bile duct: Diameter: 3 mm Liver: No focal lesion identified. Within normal limits in parenchymal echogenicity. IVC: No abnormality visualized. Pancreas: Visualized portion unremarkable. Spleen: Size and appearance within normal limits. Right Kidney: Length: 8.8 cm. Increased renal cortical echogenicity. Normal renal cortical thickness. No hydronephrosis. Left Kidney: Length: 8.6 cm. Increased renal cortical echogenicity. Normal renal cortical thickness. No hydronephrosis. Abdominal aorta: No aneurysm visualized. Other findings: None. IMPRESSION: Cholelithiasis without sonographic evidence for acute cholecystitis. Increased renal cortical echogenicity as can be seen with chronic medical renal disease. No hydronephrosis. Electronically Signed   By: Lovey Newcomer M.D.   On: 08/30/2015 20:37      No results found for: HGBA1C Lab Results  Component Value Date   CREATININE 1.73* 08/31/2015       Scheduled Meds: . aspirin EC   81 mg Oral Daily  . calcium-vitamin D  1 tablet Oral Q breakfast  . furosemide  20 mg Oral Daily  . metoprolol tartrate  25 mg Oral BID  . pantoprazole (PROTONIX) IV  40 mg Intravenous Q24H  . rosuvastatin  40 mg Oral q morning - 10a  . sodium chloride flush  3 mL Intravenous Q12H  . vitamin B-12  1,000 mcg Oral q morning - 10a  . [START ON 09/01/2015] Vitamin D (Ergocalciferol)  50,000 Units Oral Weekly   Continuous Infusions:       Time spent: >30 MINS    [facility]  [facility] Pager [phone]. If 7PM-7AM, please contact night-coverage at [email or web address], password [facility] 08/31/2015, 8:17 AM

## 2015-09-02 ENCOUNTER — Telehealth: Payer: Self-pay | Admitting: Internal Medicine

## 2015-09-02 DIAGNOSIS — N393 Stress incontinence (female) (male): Secondary | ICD-10-CM | POA: Diagnosis not present

## 2015-09-02 DIAGNOSIS — M6281 Muscle weakness (generalized): Secondary | ICD-10-CM | POA: Diagnosis not present

## 2015-09-02 DIAGNOSIS — R278 Other lack of coordination: Secondary | ICD-10-CM | POA: Diagnosis not present

## 2015-09-02 DIAGNOSIS — R262 Difficulty in walking, not elsewhere classified: Secondary | ICD-10-CM | POA: Diagnosis not present

## 2015-09-02 DIAGNOSIS — R2689 Other abnormalities of gait and mobility: Secondary | ICD-10-CM | POA: Diagnosis not present

## 2015-09-02 LAB — H PYLORI, IGM, IGG, IGA AB
H. Pylogi, Iga Abs: <9 units (ref 0.0–8.9)
H. Pylogi, Igm Abs: <9 units (ref 0.0–8.9)

## 2015-09-02 NOTE — Telephone Encounter (Signed)
New message   Pt son is calling for sooner appt

## 2015-09-03 ENCOUNTER — Telehealth: Payer: Self-pay | Admitting: Internal Medicine

## 2015-09-03 DIAGNOSIS — N393 Stress incontinence (female) (male): Secondary | ICD-10-CM | POA: Diagnosis not present

## 2015-09-03 DIAGNOSIS — M6281 Muscle weakness (generalized): Secondary | ICD-10-CM | POA: Diagnosis not present

## 2015-09-03 DIAGNOSIS — R278 Other lack of coordination: Secondary | ICD-10-CM | POA: Diagnosis not present

## 2015-09-03 DIAGNOSIS — R262 Difficulty in walking, not elsewhere classified: Secondary | ICD-10-CM | POA: Diagnosis not present

## 2015-09-03 DIAGNOSIS — R2689 Other abnormalities of gait and mobility: Secondary | ICD-10-CM | POA: Diagnosis not present

## 2015-09-03 NOTE — Telephone Encounter (Signed)
Spoke with son and have added her on to [place] [place], [place] tomorrow.  Son aware and I have informed Renee.

## 2015-09-03 NOTE — Progress Notes (Signed)
Cardiology Office Note Date:  09/04/2015  Patient ID:  Nancy [name], DOB 1925-07-18, MRN [id number] PCP:  [name], MD  Cardiologist/Electrophysiologist: Dr. Rayann Heman Pulmonary: Dr. Melvyn Novas   Chief Complaint: generalized fatigue, decreased exertional capcaity  History of Present Illness: Nancy [name] is a 82 y.o. female with history of PAFib, HLD, CRI (stage III), TIA's, VHD with mod MR, chronic CHF (diastolic), April 0000000 at [facility] with NSTEMI felt secondary to vasospasm, subsequent to that had a hospital visit secondary to diarrhea and more recently admitted 08/30/15 with CHD exacerbation, COPD/p,HTN and discharged the following day after tx with IV lasix and plans to transition to Eliquis once INR < 2.0 at her SNF.  The patient comes today c/o feeling generally tired and without energy.  She denies any ongoing CP of any kind, no abdominal discomfort, no rest SOB, no symptoms of PND or orthopnia, but since moving to the ALF she hasn't been sleeping quite as well.  She is getting tired easily with minimal exertion and having to rest.  I spoke with the patient's son, he reports her fatigue for about 2 months or so, maybe a bit longer, she had a broken foot and wore a orthopedic boot for a couple weeks and this seemed to wear her out, then moved to the ALF and was very stressful for her making this change and packing her house up and so on. This is when she had her MI, since then she has been in/out of the hospital a couple short stays and has not seemed to be able to get back to her baseline which is apparently able to go to an exercise class a couple days a week and generally pretty active for her age  The last hospital record mentions c/o melena, no FOB is noted but the patient states this is clearing with no ongoing abd complaints of any kind and is seeing her PMD today in f/u, discussed with toe patient and her son to f/u on this there.   She has continued her coumadin without stopping since  being home, her last INR was 4.56., H/H 13/43 (on May 15th was 13/39)    Past Medical History  Diagnosis Date  . Migraine   . GERD (gastroesophageal reflux disease)   . Dyslipidemia   . History of scarlet fever   . PAF (paroxysmal atrial fibrillation) ([place])     a. On Coumadin  . Depression   . Melanoma ([place])   . Mitral regurgitation     a. Echo (11/15):  EF 50-55%, Gr 2 DD, MAC, prolapsing P2 segment of post MV leaflet with mod to possibly severe MR, mild LAE, normal RVF, PASP 35 mmHg;  b. Echo 5/16: Mild LVH, EF 55-60%, trivial AI, moderate MR (LVID, ES 32.3 mm), mild BAE, moderate TR, PASP 48 c. Echo 07/2015:EF 45-50%, moderate to severe MR   . NSTEMI (non-ST elevated myocardial infarction) ([place]) 07/2015    a. Troponin peak of 7.17. Cath w/ normal cors, thought to be 2ry to vasospasm  . CKD (chronic kidney disease), stage III     Past Surgical History  Procedure Laterality Date  . Tonsillectomy    . Appendectomy    . Cataract extraction      bilateral  . Cesarean section    . Cardiac catheterization N/A 07/21/2015    Procedure: Left Heart Cath and Coronary Angiography;  Surgeon: Lorretta Harp, MD;  Location: [facility];  Service: Cardiovascular;  Laterality: N/A;  . Skin  biopsy      Current Outpatient Prescriptions  Medication Sig Dispense Refill  . calcium-vitamin D (OSCAL WITH D 500-200) 500-200 MG-UNIT per tablet Take 1 tablet by mouth daily.      . Coenzyme Q10 (CO Q 10 PO) Take 100 mg by mouth daily.     . ergocalciferol (VITAMIN D2) 50000 units capsule Take 50,000 Units by mouth once a week.    . furosemide (LASIX) 20 MG tablet Take 1 tablet (20 mg total) by mouth daily. 30 tablet 6  . metoprolol tartrate 37.5 MG TABS Take 37.5 mg by mouth 2 (two) times daily. 60 tablet 1  . Multiple Vitamins-Minerals (CENTRUM SILVER PO) Take 1 tablet by mouth daily.    . nitroGLYCERIN (NITROSTAT) 0.4 MG SL tablet Place 1 tablet (0.4 mg total) under the tongue every 5 (five)  minutes x 3 doses as needed for chest pain. 25 tablet 3  . pantoprazole (PROTONIX) 40 MG tablet Take 1 tablet (40 mg total) by mouth daily. 30 tablet 1  . vitamin B-12 (CYANOCOBALAMIN) 1000 MCG tablet Take 1,000 mcg by mouth every morning.    Marland Kitchen apixaban (ELIQUIS) 2.5 MG TABS tablet Take 1 tablet (2.5 mg total) by mouth 2 (two) times daily. 60 tablet 5  . rosuvastatin (CRESTOR) 40 MG tablet TAKE 1 TABLET (40 MG TOTAL) BY MOUTH DAILY AT 6 PM.  6   No current facility-administered medications for this visit.    Allergies:   Review of patient's allergies indicates no known allergies.   Social History:  The patient  reports that she quit smoking about 48 years ago. Her smoking use included Cigarettes. She quit after 6 years of use. She does not have any smokeless tobacco history on file. She reports that she drinks about 4.2 oz of alcohol per week. She reports that she does not use illicit drugs.   Family History:  The patient's family history is negative for Heart attack, Stroke, and Hypertension.  ROS:  Please see the history of present illness.  All other systems are reviewed and otherwise negative.   PHYSICAL EXAM:  VS:  BP 124/76 mmHg  Pulse 97  Ht 5\' 6"  (1.676 m)  Wt 131 lb (59.421 kg)  BMI 21.15 kg/m2 BMI: Body mass index is 21.15 kg/(m^2). Well nourished, well developed, in no acute distress HEENT: normocephalic, atraumatic Neck: no JVD, carotid bruits or masses Cardiac:  Irregular RR; 2/6 SM murmurs, no rubs, or gallops Lungs:  clear to auscultation bilaterally, no wheezing, rhonchi or rales Abd: soft, nontender Nancy: no deformity or atrophy Ext: trace edema Skin: warm and dry, no rash Neuro:  No gross deficits appreciated Psych: euthymic mood, full affect   EKG:  08/30/15 AF 108bpm, inf/lat ST/T changes, today's EKG is stable, AF 97bpm, no ischemic looking changes (reviewed by myself)  07/21/15 LHC: IMPRESSION:Nancy [name] has abnormal coronary arteries and normalLV function  with a small area of apical dyskinesia. His may have been embolic versus spasm. Continue medical therapy will be recommended The sheath was removed and pressure held on the groin to achieve hemostasis.The patient left the lab in stable condition.She'll be started back on her home Coumadin dose.  07/22/15: Echocardiogram Study Conclusions - Procedure narrative: Transthoracic echocardiography. Image  quality was poor. The study was technically difficult, as a  result of poor sound wave transmission. - Left ventricle: The cavity size was normal. Wall thickness was  increased in a pattern of mild LVH. Systolic function was mildly  reduced. The estimated  ejection fraction was in the range of 45%  to 50%. Diffuse hypokinesis. Doppler parameters are consistent  with a reversible restrictive pattern, indicative of decreased  left ventricular diastolic compliance and/or increased left  atrial pressure (grade 3 diastolic dysfunction). - Aortic valve: Trileaflet; mildly thickened, mildly calcified  leaflets. There was trivial regurgitation. - Mitral valve: Moderate prolapse, involving the posterior leaflet.  There was moderate to severe regurgitation. - Right atrium: The atrium was mildly dilated. - Tricuspid valve: There was severe regurgitation. - Pulmonic valve: There was moderate regurgitation. - Pulmonary arteries: Systolic pressure was moderately increased.  PA peak pressure: 56 mm Hg (S).  Recent Labs: 08/30/2015: B Natriuretic Peptide 1433.0* 08/31/2015: ALT 29; BUN 19; Creatinine, Ser 1.73*; Hemoglobin 13.6; Platelets 237; Potassium 4.0; Sodium 137  No results found for requested labs within last 365 days.   Estimated Creatinine Clearance: 21.4 mL/min (by C-G formula based on Cr of 1.73).   Wt Readings from Last 3 Encounters:  09/04/15 131 lb (59.421 kg)  08/31/15 126 lb 9.6 oz (57.425 kg)  08/18/15 131 lb 8 oz (59.648 kg)     Other studies reviewed: Additional  studies/records reviewed today include: summarized above  ASSESSMENT AND PLAN:   1. PAFib     CHA2DS2Vasc is at least 8 on coumadin     08/31/15 H/H 13/43, Creat 1.73     Discussed with the patient and her son, they would like to transition to a [place], low dose Eliquis was discussed at the hospital, though she has continued to take her coumadin.  2. NSTEMI 07/21/15     LHC as above felt embolic vs spasm     No angina, she has not felt the need to use s/l NTG  3. VHD, mod-severe MR     ? Possibly contributing to her DOE, though had at least mod VHD last year as well  4. P.HTN, COPD     Recommend she have f/u with Dr. Melvyn Novas  5. Chronic CHF      Her exam appears compensated today, she reports weight at home this AM was 128, states 128-130 is her "normal"      Discussed at length daily AM weights, minimizing sodium intake and to notify us if she ntes a 3 pound or more weight gain  6. Fatigue, decreased exertional capacity     Suspect this may be multifactorial      Significant life change moving to ALF with poor sleep     Recent foot fracture      3 hospital stays     MI     VHD  7. ?melena     She will see her PMD today to discuss     The patient and son are asked to have them send [place] a note to confirm if OK to proceed with her blood thinner     H/H looked stable and reportedly clearing     Disposition: F/u with PMD today.  We drew a BMEt and INR today to plan for Eliquis 2.5mg  BID going forward.  The patient is instructed to stop her coumadin and not start the Eliquis until called and instructed to do so.  Will keep her visit with Dr. Rayann Heman as scheduled for now.  Current medicines are reviewed at length with the patient today.  The patient did not have any concerns regarding medicines.  Haywood Lasso, PA-C 09/04/2015 11:01 AM     [facility]  [place] [id number] [phone] (office)  [phone] (fax)

## 2015-09-03 NOTE — Telephone Encounter (Signed)
This is done.

## 2015-09-03 NOTE — Telephone Encounter (Signed)
Pt's son Clair Gulling calling to change pharmacy to [facility] from [facility]

## 2015-09-04 ENCOUNTER — Encounter: Payer: Self-pay | Admitting: Physician Assistant

## 2015-09-04 ENCOUNTER — Ambulatory Visit (INDEPENDENT_AMBULATORY_CARE_PROVIDER_SITE_OTHER): Payer: Medicare Other | Admitting: Physician Assistant

## 2015-09-04 VITALS — BP 124/76 | HR 97 | Ht 66.0 in | Wt 131.0 lb

## 2015-09-04 DIAGNOSIS — I272 Other secondary pulmonary hypertension: Secondary | ICD-10-CM | POA: Diagnosis not present

## 2015-09-04 DIAGNOSIS — I481 Persistent atrial fibrillation: Secondary | ICD-10-CM | POA: Diagnosis not present

## 2015-09-04 DIAGNOSIS — K921 Melena: Secondary | ICD-10-CM | POA: Diagnosis not present

## 2015-09-04 DIAGNOSIS — R5383 Other fatigue: Secondary | ICD-10-CM | POA: Diagnosis not present

## 2015-09-04 DIAGNOSIS — I340 Nonrheumatic mitral (valve) insufficiency: Secondary | ICD-10-CM

## 2015-09-04 DIAGNOSIS — J449 Chronic obstructive pulmonary disease, unspecified: Secondary | ICD-10-CM | POA: Diagnosis not present

## 2015-09-04 DIAGNOSIS — R627 Adult failure to thrive: Secondary | ICD-10-CM | POA: Diagnosis not present

## 2015-09-04 DIAGNOSIS — R0602 Shortness of breath: Secondary | ICD-10-CM

## 2015-09-04 DIAGNOSIS — I480 Paroxysmal atrial fibrillation: Secondary | ICD-10-CM | POA: Diagnosis not present

## 2015-09-04 DIAGNOSIS — I252 Old myocardial infarction: Secondary | ICD-10-CM | POA: Diagnosis not present

## 2015-09-04 DIAGNOSIS — I4819 Other persistent atrial fibrillation: Secondary | ICD-10-CM

## 2015-09-04 DIAGNOSIS — R413 Other amnesia: Secondary | ICD-10-CM | POA: Diagnosis not present

## 2015-09-04 DIAGNOSIS — I5022 Chronic systolic (congestive) heart failure: Secondary | ICD-10-CM

## 2015-09-04 LAB — BASIC METABOLIC PANEL
BUN: 19 mg/dL (ref 7–25)
CHLORIDE: 98 mmol/L (ref 98–110)
CO2: 30 mmol/L (ref 20–31)
Calcium: 10.3 mg/dL (ref 8.6–10.4)
Creat: 1.29 mg/dL — ABNORMAL HIGH (ref 0.60–0.88)
GLUCOSE: 100 mg/dL — AB (ref 65–99)
POTASSIUM: 4.2 mmol/L (ref 3.5–5.3)
SODIUM: 140 mmol/L (ref 135–146)

## 2015-09-04 LAB — PROTIME-INR
INR: 3.22 — ABNORMAL HIGH (ref <1.50)
Prothrombin Time: 33.4 seconds — ABNORMAL HIGH (ref 11.6–15.2)

## 2015-09-04 MED ORDER — APIXABAN 2.5 MG PO TABS: 2.5000 mg | ORAL_TABLET | Freq: Two times a day (BID) | ORAL | Status: DC

## 2015-09-04 NOTE — Patient Instructions (Addendum)
Medication Instructions:   STOP  TAKING WARFARIN  START  TAKING  ELIQUIS 2.5  MG TWICE A DAY : DO NOT START UNTIL LAB RESULTS ARE IN AND YOUR ARE INSTRUCTED TO START    If you need a refill on your cardiac medications before your next appointment, please call your pharmacy.  Labwork: BMET AND PT/INR   Testing/Procedures: NONE ORDER TODAY   Follow-Up: KEEP APPOINTMENTS AS SCHEULED   Any Other Special Instructions Will Be Listed Below (If Applicable).   MAKE SURE YOU WEIGHT YOURSELF DAILY  AND NOTIFY OFFICE IF YOU HAVE GAINED 3LBS OR MORE IN 24 HRS

## 2015-09-05 ENCOUNTER — Other Ambulatory Visit: Payer: Self-pay | Admitting: *Deleted

## 2015-09-05 ENCOUNTER — Telehealth: Payer: Self-pay | Admitting: *Deleted

## 2015-09-05 DIAGNOSIS — R262 Difficulty in walking, not elsewhere classified: Secondary | ICD-10-CM | POA: Diagnosis not present

## 2015-09-05 DIAGNOSIS — M6281 Muscle weakness (generalized): Secondary | ICD-10-CM | POA: Diagnosis not present

## 2015-09-05 DIAGNOSIS — R2689 Other abnormalities of gait and mobility: Secondary | ICD-10-CM | POA: Diagnosis not present

## 2015-09-05 DIAGNOSIS — R278 Other lack of coordination: Secondary | ICD-10-CM | POA: Diagnosis not present

## 2015-09-05 DIAGNOSIS — I4891 Unspecified atrial fibrillation: Secondary | ICD-10-CM

## 2015-09-05 DIAGNOSIS — N393 Stress incontinence (female) (male): Secondary | ICD-10-CM | POA: Diagnosis not present

## 2015-09-05 NOTE — Telephone Encounter (Signed)
-----   Message from [place], [place] sent at 09/04/2015  6:05 PM EDT ----- Please let the patient know her INR is 3.22, to high to switch to the Eliquis, continue to hold the coumadin, recheck Monday morning, need the INR to be less then 2.0 to get started.    Please get her office note from the PMD from this afternoon's visit with him  Thanks Joseph Art

## 2015-09-05 NOTE — Telephone Encounter (Signed)
-----   Message from [place], [place] sent at 09/04/2015  6:05 PM EDT ----- Please let the patient know her INR is 3.22, to high to switch to the Eliquis, continue to hold the coumadin, recheck Monday morning, need the INR to be less then 2.0 to get started.    Please get her office note from the PMD from this afternoon's visit with him  Thanks Joseph Art

## 2015-09-07 DIAGNOSIS — M6281 Muscle weakness (generalized): Secondary | ICD-10-CM | POA: Diagnosis not present

## 2015-09-07 DIAGNOSIS — N393 Stress incontinence (female) (male): Secondary | ICD-10-CM | POA: Diagnosis not present

## 2015-09-07 DIAGNOSIS — R2689 Other abnormalities of gait and mobility: Secondary | ICD-10-CM | POA: Diagnosis not present

## 2015-09-07 DIAGNOSIS — R262 Difficulty in walking, not elsewhere classified: Secondary | ICD-10-CM | POA: Diagnosis not present

## 2015-09-07 DIAGNOSIS — R278 Other lack of coordination: Secondary | ICD-10-CM | POA: Diagnosis not present

## 2015-09-08 ENCOUNTER — Encounter: Payer: Self-pay | Admitting: Physician Assistant

## 2015-09-08 ENCOUNTER — Telehealth: Payer: Self-pay | Admitting: *Deleted

## 2015-09-08 ENCOUNTER — Telehealth: Payer: Self-pay | Admitting: Internal Medicine

## 2015-09-08 ENCOUNTER — Other Ambulatory Visit: Payer: Medicare Other

## 2015-09-08 DIAGNOSIS — K921 Melena: Secondary | ICD-10-CM | POA: Diagnosis not present

## 2015-09-08 DIAGNOSIS — M6281 Muscle weakness (generalized): Secondary | ICD-10-CM | POA: Diagnosis not present

## 2015-09-08 DIAGNOSIS — N393 Stress incontinence (female) (male): Secondary | ICD-10-CM | POA: Diagnosis not present

## 2015-09-08 DIAGNOSIS — R262 Difficulty in walking, not elsewhere classified: Secondary | ICD-10-CM | POA: Diagnosis not present

## 2015-09-08 DIAGNOSIS — R2689 Other abnormalities of gait and mobility: Secondary | ICD-10-CM | POA: Diagnosis not present

## 2015-09-08 DIAGNOSIS — R278 Other lack of coordination: Secondary | ICD-10-CM | POA: Diagnosis not present

## 2015-09-08 NOTE — Telephone Encounter (Signed)
  SON CALLED BACK AND WE DISCUSSED ABOUT LAB ORDERS BEING FAXED  SENT TO GET BLOOD WORK   I STATED WILL CONTACT OFFICE FOR FURTHER INFORMATION

## 2015-09-08 NOTE — Telephone Encounter (Signed)
lmvom of son

## 2015-09-08 NOTE — Telephone Encounter (Signed)
New Message:  Pt is supposed to have lab work today at [facility] [employer] doctor and at [facility]. She wants to know if her primary doctor can do it all? If so will need an order sent to him. She is seeing Dr Jenny Reichmann Russo-Phone number is [phone].

## 2015-09-08 NOTE — Telephone Encounter (Signed)
LMOVM PROVIDER HAS BEEN CONTACTED ABOUT RX FOR LABS TO SENT OVER TO DR RUSSO...LMOVM .Marland KitchenMarland Kitchen ALSO TO CONTACT BACK TO MAKE SURE HE GETS MESSAGE [phone] DIRECT TO POD F

## 2015-09-08 NOTE — Telephone Encounter (Signed)
Spoke with son.

## 2015-09-09 ENCOUNTER — Telehealth: Payer: Self-pay | Admitting: Physician Assistant

## 2015-09-09 ENCOUNTER — Telehealth: Payer: Self-pay | Admitting: *Deleted

## 2015-09-09 ENCOUNTER — Other Ambulatory Visit (INDEPENDENT_AMBULATORY_CARE_PROVIDER_SITE_OTHER): Payer: Medicare Other

## 2015-09-09 DIAGNOSIS — M6281 Muscle weakness (generalized): Secondary | ICD-10-CM | POA: Diagnosis not present

## 2015-09-09 DIAGNOSIS — R262 Difficulty in walking, not elsewhere classified: Secondary | ICD-10-CM | POA: Diagnosis not present

## 2015-09-09 DIAGNOSIS — R2689 Other abnormalities of gait and mobility: Secondary | ICD-10-CM | POA: Diagnosis not present

## 2015-09-09 DIAGNOSIS — I4891 Unspecified atrial fibrillation: Secondary | ICD-10-CM | POA: Diagnosis not present

## 2015-09-09 DIAGNOSIS — N393 Stress incontinence (female) (male): Secondary | ICD-10-CM | POA: Diagnosis not present

## 2015-09-09 DIAGNOSIS — R278 Other lack of coordination: Secondary | ICD-10-CM | POA: Diagnosis not present

## 2015-09-09 LAB — PROTIME-INR
INR: 1.56 — AB (ref <1.50)
Prothrombin Time: 18.9 seconds — ABNORMAL HIGH (ref 11.6–15.2)

## 2015-09-09 NOTE — Telephone Encounter (Signed)
Spoke with the patient's son Laverna Peace, (who the patient has asked we communicate with) and instructed him to have his Mom (the patient) go ahead and get started on the Eliquis.  I called and confirmed with him now the INR came back at 1.5 and he stated he had already spoken to his Mom and she knows to start the Eliquis.  He states she is feeling a bit better the last couple days and glad to be off the coumadin.  Asked to call if any concerns or issues with the Eliquis, monitoring for any bleeding or signs of bleeding and if any to contact us right away.  He stated understanding   Tommye Standard, PA-C

## 2015-09-09 NOTE — Telephone Encounter (Signed)
PT done today, not resulted yet.

## 2015-09-09 NOTE — Telephone Encounter (Signed)
-----   Message from [place], [place] sent at 09/08/2015  5:53 PM EDT ----- Regarding: PT NEEDS TO BE INSTRUCTED TO START ELIQUIS BASED UP RESULTS PT SCHEDULED TO COME IN TOMORROW TO GET PT/INR  CHECK AGAIN    IF 2.0 CAN START TAKING ELIQUIS PER RENEE URSUY  PT HAS HELD COUMADIN SINCE LAST WEEK TO TRANSITION TO ELIQUIS  HER SON TIM IS HER CONTACT 1 [phone] HE NEEDS AN   DPR ALSO SINCE HE NOW IS HER CONTACT  Aleene Davidson

## 2015-09-09 NOTE — Telephone Encounter (Signed)
PT/INR today was 18.9/1.56. Spoke with patient's son Nancy [name] at the # listed.  He is aware of results and OK to start Eliquis 2.5 mg BID.  They have already picked up the RX and have no further questions.  Pt will f/u as scheduled 6/26 with Dr Rayann Heman.

## 2015-09-10 DIAGNOSIS — H02209 Unspecified lagophthalmos unspecified eye, unspecified eyelid: Secondary | ICD-10-CM | POA: Diagnosis not present

## 2015-09-10 DIAGNOSIS — H16143 Punctate keratitis, bilateral: Secondary | ICD-10-CM | POA: Diagnosis not present

## 2015-09-11 DIAGNOSIS — R262 Difficulty in walking, not elsewhere classified: Secondary | ICD-10-CM | POA: Diagnosis not present

## 2015-09-11 DIAGNOSIS — R278 Other lack of coordination: Secondary | ICD-10-CM | POA: Diagnosis not present

## 2015-09-11 DIAGNOSIS — N393 Stress incontinence (female) (male): Secondary | ICD-10-CM | POA: Diagnosis not present

## 2015-09-11 DIAGNOSIS — R2689 Other abnormalities of gait and mobility: Secondary | ICD-10-CM | POA: Diagnosis not present

## 2015-09-11 DIAGNOSIS — M6281 Muscle weakness (generalized): Secondary | ICD-10-CM | POA: Diagnosis not present

## 2015-09-11 DIAGNOSIS — Z1212 Encounter for screening for malignant neoplasm of rectum: Secondary | ICD-10-CM | POA: Diagnosis not present

## 2015-09-12 DIAGNOSIS — M6281 Muscle weakness (generalized): Secondary | ICD-10-CM | POA: Diagnosis not present

## 2015-09-12 DIAGNOSIS — N393 Stress incontinence (female) (male): Secondary | ICD-10-CM | POA: Diagnosis not present

## 2015-09-12 DIAGNOSIS — R2689 Other abnormalities of gait and mobility: Secondary | ICD-10-CM | POA: Diagnosis not present

## 2015-09-12 DIAGNOSIS — R278 Other lack of coordination: Secondary | ICD-10-CM | POA: Diagnosis not present

## 2015-09-12 DIAGNOSIS — R262 Difficulty in walking, not elsewhere classified: Secondary | ICD-10-CM | POA: Diagnosis not present

## 2015-09-15 DIAGNOSIS — R278 Other lack of coordination: Secondary | ICD-10-CM | POA: Diagnosis not present

## 2015-09-15 DIAGNOSIS — M6281 Muscle weakness (generalized): Secondary | ICD-10-CM | POA: Diagnosis not present

## 2015-09-15 DIAGNOSIS — N393 Stress incontinence (female) (male): Secondary | ICD-10-CM | POA: Diagnosis not present

## 2015-09-15 DIAGNOSIS — R262 Difficulty in walking, not elsewhere classified: Secondary | ICD-10-CM | POA: Diagnosis not present

## 2015-09-15 DIAGNOSIS — R2689 Other abnormalities of gait and mobility: Secondary | ICD-10-CM | POA: Diagnosis not present

## 2015-09-16 DIAGNOSIS — R262 Difficulty in walking, not elsewhere classified: Secondary | ICD-10-CM | POA: Diagnosis not present

## 2015-09-16 DIAGNOSIS — R2689 Other abnormalities of gait and mobility: Secondary | ICD-10-CM | POA: Diagnosis not present

## 2015-09-16 DIAGNOSIS — R278 Other lack of coordination: Secondary | ICD-10-CM | POA: Diagnosis not present

## 2015-09-16 DIAGNOSIS — N393 Stress incontinence (female) (male): Secondary | ICD-10-CM | POA: Diagnosis not present

## 2015-09-16 DIAGNOSIS — M6281 Muscle weakness (generalized): Secondary | ICD-10-CM | POA: Diagnosis not present

## 2015-09-17 DIAGNOSIS — N393 Stress incontinence (female) (male): Secondary | ICD-10-CM | POA: Diagnosis not present

## 2015-09-17 DIAGNOSIS — R2689 Other abnormalities of gait and mobility: Secondary | ICD-10-CM | POA: Diagnosis not present

## 2015-09-17 DIAGNOSIS — M6281 Muscle weakness (generalized): Secondary | ICD-10-CM | POA: Diagnosis not present

## 2015-09-17 DIAGNOSIS — R262 Difficulty in walking, not elsewhere classified: Secondary | ICD-10-CM | POA: Diagnosis not present

## 2015-09-17 DIAGNOSIS — R278 Other lack of coordination: Secondary | ICD-10-CM | POA: Diagnosis not present

## 2015-09-18 DIAGNOSIS — R262 Difficulty in walking, not elsewhere classified: Secondary | ICD-10-CM | POA: Diagnosis not present

## 2015-09-18 DIAGNOSIS — R278 Other lack of coordination: Secondary | ICD-10-CM | POA: Diagnosis not present

## 2015-09-18 DIAGNOSIS — N393 Stress incontinence (female) (male): Secondary | ICD-10-CM | POA: Diagnosis not present

## 2015-09-18 DIAGNOSIS — M6281 Muscle weakness (generalized): Secondary | ICD-10-CM | POA: Diagnosis not present

## 2015-09-18 DIAGNOSIS — R2689 Other abnormalities of gait and mobility: Secondary | ICD-10-CM | POA: Diagnosis not present

## 2015-09-19 DIAGNOSIS — R278 Other lack of coordination: Secondary | ICD-10-CM | POA: Diagnosis not present

## 2015-09-19 DIAGNOSIS — R262 Difficulty in walking, not elsewhere classified: Secondary | ICD-10-CM | POA: Diagnosis not present

## 2015-09-19 DIAGNOSIS — N393 Stress incontinence (female) (male): Secondary | ICD-10-CM | POA: Diagnosis not present

## 2015-09-19 DIAGNOSIS — R2689 Other abnormalities of gait and mobility: Secondary | ICD-10-CM | POA: Diagnosis not present

## 2015-09-19 DIAGNOSIS — M6281 Muscle weakness (generalized): Secondary | ICD-10-CM | POA: Diagnosis not present

## 2015-09-22 DIAGNOSIS — R2689 Other abnormalities of gait and mobility: Secondary | ICD-10-CM | POA: Diagnosis not present

## 2015-09-22 DIAGNOSIS — N393 Stress incontinence (female) (male): Secondary | ICD-10-CM | POA: Diagnosis not present

## 2015-09-22 DIAGNOSIS — M6281 Muscle weakness (generalized): Secondary | ICD-10-CM | POA: Diagnosis not present

## 2015-09-22 DIAGNOSIS — R278 Other lack of coordination: Secondary | ICD-10-CM | POA: Diagnosis not present

## 2015-09-22 DIAGNOSIS — R262 Difficulty in walking, not elsewhere classified: Secondary | ICD-10-CM | POA: Diagnosis not present

## 2015-09-24 ENCOUNTER — Encounter (HOSPITAL_COMMUNITY): Payer: Self-pay | Admitting: Emergency Medicine

## 2015-09-24 ENCOUNTER — Other Ambulatory Visit: Payer: Self-pay

## 2015-09-24 ENCOUNTER — Inpatient Hospital Stay (HOSPITAL_COMMUNITY)
Admission: EM | Admit: 2015-09-24 | Discharge: 2015-09-30 | DRG: 091 | Disposition: A | Discharge status: Home Health | Payer: Medicare Other | Attending: Internal Medicine | Admitting: Internal Medicine

## 2015-09-24 ENCOUNTER — Emergency Department (HOSPITAL_COMMUNITY): Payer: Medicare Other

## 2015-09-24 DIAGNOSIS — Z8371 Family history of colonic polyps: Secondary | ICD-10-CM

## 2015-09-24 DIAGNOSIS — Z7982 Long term (current) use of aspirin: Secondary | ICD-10-CM

## 2015-09-24 DIAGNOSIS — R5383 Other fatigue: Secondary | ICD-10-CM | POA: Diagnosis not present

## 2015-09-24 DIAGNOSIS — I130 Hypertensive heart and chronic kidney disease with heart failure and stage 1 through stage 4 chronic kidney disease, or unspecified chronic kidney disease: Secondary | ICD-10-CM | POA: Diagnosis present

## 2015-09-24 DIAGNOSIS — T733XXD Exhaustion due to excessive exertion, subsequent encounter: Secondary | ICD-10-CM

## 2015-09-24 DIAGNOSIS — R627 Adult failure to thrive: Secondary | ICD-10-CM | POA: Diagnosis not present

## 2015-09-24 DIAGNOSIS — K8020 Calculus of gallbladder without cholecystitis without obstruction: Secondary | ICD-10-CM | POA: Diagnosis not present

## 2015-09-24 DIAGNOSIS — N184 Chronic kidney disease, stage 4 (severe): Secondary | ICD-10-CM | POA: Diagnosis not present

## 2015-09-24 DIAGNOSIS — R5382 Chronic fatigue, unspecified: Secondary | ICD-10-CM | POA: Insufficient documentation

## 2015-09-24 DIAGNOSIS — S36119D Unspecified injury of liver, subsequent encounter: Secondary | ICD-10-CM

## 2015-09-24 DIAGNOSIS — K921 Melena: Secondary | ICD-10-CM | POA: Diagnosis not present

## 2015-09-24 DIAGNOSIS — I480 Paroxysmal atrial fibrillation: Secondary | ICD-10-CM | POA: Diagnosis not present

## 2015-09-24 DIAGNOSIS — Z79899 Other long term (current) drug therapy: Secondary | ICD-10-CM | POA: Diagnosis not present

## 2015-09-24 DIAGNOSIS — R413 Other amnesia: Secondary | ICD-10-CM | POA: Diagnosis not present

## 2015-09-24 DIAGNOSIS — N179 Acute kidney failure, unspecified: Secondary | ICD-10-CM | POA: Insufficient documentation

## 2015-09-24 DIAGNOSIS — G43909 Migraine, unspecified, not intractable, without status migrainosus: Secondary | ICD-10-CM | POA: Diagnosis present

## 2015-09-24 DIAGNOSIS — J90 Pleural effusion, not elsewhere classified: Secondary | ICD-10-CM | POA: Diagnosis not present

## 2015-09-24 DIAGNOSIS — E876 Hypokalemia: Secondary | ICD-10-CM | POA: Diagnosis present

## 2015-09-24 DIAGNOSIS — I081 Rheumatic disorders of both mitral and tricuspid valves: Secondary | ICD-10-CM | POA: Diagnosis present

## 2015-09-24 DIAGNOSIS — I272 Other secondary pulmonary hypertension: Secondary | ICD-10-CM | POA: Diagnosis present

## 2015-09-24 DIAGNOSIS — I5043 Acute on chronic combined systolic (congestive) and diastolic (congestive) heart failure: Secondary | ICD-10-CM | POA: Diagnosis present

## 2015-09-24 DIAGNOSIS — K219 Gastro-esophageal reflux disease without esophagitis: Secondary | ICD-10-CM | POA: Diagnosis present

## 2015-09-24 DIAGNOSIS — Z8673 Personal history of transient ischemic attack (TIA), and cerebral infarction without residual deficits: Secondary | ICD-10-CM

## 2015-09-24 DIAGNOSIS — I252 Old myocardial infarction: Secondary | ICD-10-CM

## 2015-09-24 DIAGNOSIS — M6282 Rhabdomyolysis: Secondary | ICD-10-CM | POA: Diagnosis present

## 2015-09-24 DIAGNOSIS — K719 Toxic liver disease, unspecified: Secondary | ICD-10-CM | POA: Diagnosis not present

## 2015-09-24 DIAGNOSIS — R935 Abnormal findings on diagnostic imaging of other abdominal regions, including retroperitoneum: Secondary | ICD-10-CM | POA: Diagnosis not present

## 2015-09-24 DIAGNOSIS — J449 Chronic obstructive pulmonary disease, unspecified: Secondary | ICD-10-CM | POA: Diagnosis present

## 2015-09-24 DIAGNOSIS — I428 Other cardiomyopathies: Secondary | ICD-10-CM | POA: Diagnosis present

## 2015-09-24 DIAGNOSIS — D689 Coagulation defect, unspecified: Secondary | ICD-10-CM | POA: Diagnosis present

## 2015-09-24 DIAGNOSIS — R7989 Other specified abnormal findings of blood chemistry: Secondary | ICD-10-CM | POA: Diagnosis present

## 2015-09-24 DIAGNOSIS — Z7901 Long term (current) use of anticoagulants: Secondary | ICD-10-CM | POA: Diagnosis not present

## 2015-09-24 DIAGNOSIS — Z87891 Personal history of nicotine dependence: Secondary | ICD-10-CM

## 2015-09-24 DIAGNOSIS — G720 Drug-induced myopathy: Principal | ICD-10-CM | POA: Diagnosis present

## 2015-09-24 DIAGNOSIS — K769 Liver disease, unspecified: Secondary | ICD-10-CM | POA: Diagnosis not present

## 2015-09-24 DIAGNOSIS — R0602 Shortness of breath: Secondary | ICD-10-CM | POA: Diagnosis present

## 2015-09-24 DIAGNOSIS — R278 Other lack of coordination: Secondary | ICD-10-CM | POA: Diagnosis not present

## 2015-09-24 DIAGNOSIS — Z85820 Personal history of malignant melanoma of skin: Secondary | ICD-10-CM | POA: Diagnosis not present

## 2015-09-24 DIAGNOSIS — I071 Rheumatic tricuspid insufficiency: Secondary | ICD-10-CM

## 2015-09-24 DIAGNOSIS — N289 Disorder of kidney and ureter, unspecified: Secondary | ICD-10-CM | POA: Diagnosis not present

## 2015-09-24 DIAGNOSIS — E785 Hyperlipidemia, unspecified: Secondary | ICD-10-CM | POA: Diagnosis present

## 2015-09-24 DIAGNOSIS — K759 Inflammatory liver disease, unspecified: Secondary | ICD-10-CM | POA: Diagnosis present

## 2015-09-24 DIAGNOSIS — R2689 Other abnormalities of gait and mobility: Secondary | ICD-10-CM | POA: Diagnosis not present

## 2015-09-24 DIAGNOSIS — T50995A Adverse effect of other drugs, medicaments and biological substances, initial encounter: Secondary | ICD-10-CM | POA: Diagnosis present

## 2015-09-24 DIAGNOSIS — I248 Other forms of acute ischemic heart disease: Secondary | ICD-10-CM | POA: Diagnosis present

## 2015-09-24 DIAGNOSIS — M6281 Muscle weakness (generalized): Secondary | ICD-10-CM | POA: Diagnosis not present

## 2015-09-24 DIAGNOSIS — T733XXA Exhaustion due to excessive exertion, initial encounter: Secondary | ICD-10-CM | POA: Diagnosis not present

## 2015-09-24 DIAGNOSIS — R262 Difficulty in walking, not elsewhere classified: Secondary | ICD-10-CM | POA: Diagnosis not present

## 2015-09-24 DIAGNOSIS — I5041 Acute combined systolic (congestive) and diastolic (congestive) heart failure: Secondary | ICD-10-CM | POA: Diagnosis not present

## 2015-09-24 DIAGNOSIS — S36119A Unspecified injury of liver, initial encounter: Secondary | ICD-10-CM | POA: Insufficient documentation

## 2015-09-24 DIAGNOSIS — R945 Abnormal results of liver function studies: Secondary | ICD-10-CM

## 2015-09-24 DIAGNOSIS — I340 Nonrheumatic mitral (valve) insufficiency: Secondary | ICD-10-CM | POA: Diagnosis not present

## 2015-09-24 DIAGNOSIS — I509 Heart failure, unspecified: Secondary | ICD-10-CM | POA: Diagnosis not present

## 2015-09-24 DIAGNOSIS — N393 Stress incontinence (female) (male): Secondary | ICD-10-CM | POA: Diagnosis not present

## 2015-09-24 DIAGNOSIS — I059 Rheumatic mitral valve disease, unspecified: Secondary | ICD-10-CM

## 2015-09-24 DIAGNOSIS — K8021 Calculus of gallbladder without cholecystitis with obstruction: Secondary | ICD-10-CM | POA: Diagnosis not present

## 2015-09-24 LAB — CBC WITH DIFFERENTIAL/PLATELET
BASOS ABS: 0 10*3/uL (ref 0.0–0.1)
BASOS PCT: 0 %
Eosinophils Absolute: 0 10*3/uL (ref 0.0–0.7)
Eosinophils Relative: 0 %
HEMATOCRIT: 46.4 % — AB (ref 36.0–46.0)
HEMOGLOBIN: 15.2 g/dL — AB (ref 12.0–15.0)
LYMPHS PCT: 15 %
Lymphs Abs: 1.6 10*3/uL (ref 0.7–4.0)
MCH: 30.8 pg (ref 26.0–34.0)
MCHC: 32.8 g/dL (ref 30.0–36.0)
MCV: 94.1 fL (ref 78.0–100.0)
MONO ABS: 0.8 10*3/uL (ref 0.1–1.0)
Monocytes Relative: 8 %
NEUTROS ABS: 8.3 10*3/uL — AB (ref 1.7–7.7)
NEUTROS PCT: 77 %
Platelets: 299 10*3/uL (ref 150–400)
RBC: 4.93 MIL/uL (ref 3.87–5.11)
RDW: 15.1 % (ref 11.5–15.5)
WBC: 10.7 10*3/uL — AB (ref 4.0–10.5)

## 2015-09-24 LAB — URINALYSIS, ROUTINE W REFLEX MICROSCOPIC
Bilirubin Urine: NEGATIVE
Glucose, UA: 250 mg/dL — AB
Ketones, ur: NEGATIVE mg/dL
Leukocytes, UA: NEGATIVE
NITRITE: NEGATIVE
Protein, ur: 100 mg/dL — AB
SPECIFIC GRAVITY, URINE: 1.014 (ref 1.005–1.030)
pH: 7 (ref 5.0–8.0)

## 2015-09-24 LAB — COMPREHENSIVE METABOLIC PANEL
ALBUMIN: 2.8 g/dL — AB (ref 3.5–5.0)
ALT: 145 U/L — AB (ref 14–54)
AST: 367 U/L — AB (ref 15–41)
Alkaline Phosphatase: 184 U/L — ABNORMAL HIGH (ref 38–126)
Anion gap: 9 (ref 5–15)
BILIRUBIN TOTAL: 1.3 mg/dL — AB (ref 0.3–1.2)
BUN: 21 mg/dL — AB (ref 6–20)
CO2: 30 mmol/L (ref 22–32)
CREATININE: 1.7 mg/dL — AB (ref 0.44–1.00)
Calcium: 9.5 mg/dL (ref 8.9–10.3)
Chloride: 99 mmol/L — ABNORMAL LOW (ref 101–111)
GFR calc Af Amer: 30 mL/min — ABNORMAL LOW (ref >60)
GFR calc non Af Amer: 26 mL/min — ABNORMAL LOW (ref >60)
GLUCOSE: 138 mg/dL — AB (ref 65–99)
POTASSIUM: 3 mmol/L — AB (ref 3.5–5.1)
Sodium: 138 mmol/L (ref 135–145)
TOTAL PROTEIN: 7.3 g/dL (ref 6.5–8.1)

## 2015-09-24 LAB — URINE MICROSCOPIC-ADD ON

## 2015-09-24 LAB — CK: Total CK: 7045 U/L — ABNORMAL HIGH (ref 38–234)

## 2015-09-24 LAB — I-STAT TROPONIN, ED: Troponin i, poc: 0.04 ng/mL (ref 0.00–0.08)

## 2015-09-24 LAB — T4, FREE: Free T4: 1.64 ng/dL — ABNORMAL HIGH (ref 0.61–1.12)

## 2015-09-24 LAB — BRAIN NATRIURETIC PEPTIDE: B Natriuretic Peptide: 1347.3 pg/mL — ABNORMAL HIGH (ref 0.0–100.0)

## 2015-09-24 LAB — AMMONIA: Ammonia: 38 umol/L — ABNORMAL HIGH (ref 9–35)

## 2015-09-24 LAB — PHOSPHORUS: Phosphorus: 2 mg/dL — ABNORMAL LOW (ref 2.5–4.6)

## 2015-09-24 LAB — TSH: TSH: 1.875 u[IU]/mL (ref 0.350–4.500)

## 2015-09-24 LAB — MAGNESIUM: Magnesium: 2.2 mg/dL (ref 1.7–2.4)

## 2015-09-24 LAB — TROPONIN I: Troponin I: 0.09 ng/mL — ABNORMAL HIGH (ref <0.031)

## 2015-09-24 LAB — ETHANOL: Alcohol, Ethyl (B): <5 mg/dL (ref <5)

## 2015-09-24 MED ORDER — POTASSIUM CHLORIDE CRYS ER 20 MEQ PO TBCR
40.0000 meq | EXTENDED_RELEASE_TABLET | Freq: Once | ORAL | Status: AC
  Administered 2015-09-24: 40 meq via ORAL
  Filled 2015-09-24: qty 2

## 2015-09-24 MED ORDER — VITAMIN B-12 1000 MCG PO TABS
1000.0000 ug | ORAL_TABLET | Freq: Every morning | ORAL | Status: DC
  Administered 2015-09-25: 1000 ug via ORAL
  Filled 2015-09-24 (×3): qty 1

## 2015-09-24 MED ORDER — METOPROLOL TARTRATE 25 MG PO TABS
37.5000 mg | ORAL_TABLET | Freq: Two times a day (BID) | ORAL | Status: DC
  Administered 2015-09-24 – 2015-09-27 (×3): 37.5 mg via ORAL
  Filled 2015-09-24 (×6): qty 1

## 2015-09-24 MED ORDER — POTASSIUM CHLORIDE 20 MEQ PO PACK
40.0000 meq | PACK | Freq: Once | ORAL | Status: AC
  Administered 2015-09-24: 40 meq via ORAL
  Filled 2015-09-24: qty 2

## 2015-09-24 MED ORDER — WARFARIN SODIUM 2.5 MG PO TABS: 2.5000 mg | ORAL_TABLET | ORAL | Status: DC

## 2015-09-24 MED ORDER — NITROGLYCERIN 0.4 MG SL SUBL: 0.4000 mg | SUBLINGUAL_TABLET | SUBLINGUAL | Status: DC | PRN

## 2015-09-24 MED ORDER — PANTOPRAZOLE SODIUM 40 MG PO TBEC
40.0000 mg | DELAYED_RELEASE_TABLET | Freq: Every day | ORAL | Status: DC
  Administered 2015-09-25: 40 mg via ORAL
  Filled 2015-09-24 (×3): qty 1

## 2015-09-24 MED ORDER — APIXABAN 2.5 MG PO TABS
2.5000 mg | ORAL_TABLET | Freq: Two times a day (BID) | ORAL | Status: DC
  Administered 2015-09-24 – 2015-09-27 (×4): 2.5 mg via ORAL
  Filled 2015-09-24 (×7): qty 1

## 2015-09-24 NOTE — ED Notes (Signed)
Ambulated pt to restroom from room. Pt stated she felt SOB.

## 2015-09-24 NOTE — ED Provider Notes (Signed)
CSN: [id number]     Arrival date & time 09/24/15  1425 History   First MD Initiated Contact with Patient 09/24/15 1506     Chief Complaint  Patient presents with  . Fatigue   HPI Pt arrives via [facility] from [place] at [facility], reports pt is a/o but has times where she "zones out," per staff's report to them. Pt reports fatigue, pt also reports recent change in BP meds and that she was feeling good prior to med change. Pt reports bilateral leg weakness. She has no specific complaints otherwise. States symptoms have been progressively worse. She does have a history of CHF, recent and STEMI in April. However, denies any shortness of breath or chest pain. She denies being lightheadedness and denies any volume overload.  When questioning patient, she states she did not have any blood pressure medications changed but instead had her Coumadin transitioned Eliquis. Pt was working with PT/OT when event occurred. I spoke with Vivia Ewing who states she was very drowsy, tired. Her vitals were 116/78, pulse was 50-54, she was breathing slowly and taking deep breaths around 10 RR. She did not pass out, no shaking. She did not actually zone out, she was just very tired. Had taken a tylenol earlier for back pain, which is not new. PT/OT states that she is intermittently worse but today was particularly worse.   Review of systems mostly negative as noted below.  Past Medical History  Diagnosis Date  . Migraine   . GERD (gastroesophageal reflux disease)   . Dyslipidemia   . History of scarlet fever   . PAF (paroxysmal atrial fibrillation) ([place])     a. On Coumadin  . Depression   . Melanoma ([place])   . Mitral regurgitation     a. Echo (11/15):  EF 50-55%, Gr 2 DD, MAC, prolapsing P2 segment of post MV leaflet with mod to possibly severe MR, mild LAE, normal RVF, PASP 35 mmHg;  b. Echo 5/16: Mild LVH, EF 55-60%, trivial AI, moderate MR (LVID, ES 32.3 mm), mild BAE, moderate TR, PASP 48 c. Echo 07/2015:EF  45-50%, moderate to severe MR   . NSTEMI (non-ST elevated myocardial infarction) ([place]) 07/2015    a. Troponin peak of 7.17. Cath w/ normal cors, thought to be 2ry to vasospasm  . CKD (chronic kidney disease), stage III    Past Surgical History  Procedure Laterality Date  . Tonsillectomy    . Appendectomy    . Cataract extraction      bilateral  . Cesarean section    . Cardiac catheterization N/A 07/21/2015    Procedure: Left Heart Cath and Coronary Angiography;  Surgeon: Lorretta Harp, MD;  Location: [facility];  Service: Cardiovascular;  Laterality: N/A;  . Skin biopsy     Family History  Problem Relation Age of Onset  . Colon polyps    . Heart attack Neg Hx   . Stroke Neg Hx   . Hypertension Neg Hx    Social History  Substance Use Topics  . Smoking status: Former Smoker -- 6 years    Types: Cigarettes    Quit date: 09/09/1967  . Smokeless tobacco: None     Comment: pt reports she smoked socially  . Alcohol Use: 4.2 oz/week    7 Glasses of wine per week   OB History    No data available     Review of Systems  Constitutional: Negative for appetite change (low appetite at baseline).  HENT:  Negative for congestion and ear pain.   Respiratory: Negative for cough and shortness of breath (but did endorse when i walked her, states baseline).   Cardiovascular: Negative for chest pain, palpitations and leg swelling.  Gastrointestinal: Negative for nausea, vomiting, abdominal pain, diarrhea, blood in stool and anal bleeding.  Genitourinary: Negative for dysuria.  Skin: Negative for rash and wound.  Allergic/Immunologic: Negative for immunocompromised state.  Neurological: Positive for weakness. Negative for dizziness, seizures, syncope and light-headedness.  All other systems reviewed and are negative. no rectal bleeding    Allergies  Review of patient's allergies indicates no known allergies.  Home Medications   Prior to Admission medications   Medication  Sig Start Date End Date Taking? Authorizing Provider  apixaban (ELIQUIS) 2.5 MG TABS tablet Take 1 tablet (2.5 mg total) by mouth 2 (two) times daily. 09/04/15  Yes Renee Dyane Dustman, PA-C  furosemide (LASIX) 20 MG tablet Take 1 tablet (20 mg total) by mouth daily. 08/31/15  Yes Reyne Dumas, MD  metoprolol tartrate 37.5 MG TABS Take 37.5 mg by mouth 2 (two) times daily. 08/31/15  Yes Reyne Dumas, MD  Multiple Vitamins-Minerals (CENTRUM SILVER PO) Take 1 tablet by mouth daily.   Yes Historical Provider, MD  rosuvastatin (CRESTOR) 40 MG tablet TAKE 1 TABLET (40 MG TOTAL) BY MOUTH DAILY AT 6 PM. 08/11/15  Yes Historical Provider, MD  calcium-vitamin D (OSCAL WITH D 500-200) 500-200 MG-UNIT per tablet Take 1 tablet by mouth daily.      Historical Provider, MD  Coenzyme Q10 (CO Q 10 PO) Take 100 mg by mouth daily.     Historical Provider, MD  ergocalciferol (VITAMIN D2) 50000 units capsule Take 50,000 Units by mouth once a week.    Historical Provider, MD  nitroGLYCERIN (NITROSTAT) 0.4 MG SL tablet Place 1 tablet (0.4 mg total) under the tongue every 5 (five) minutes x 3 doses as needed for chest pain. 07/22/15   Erma Heritage, PA  pantoprazole (PROTONIX) 40 MG tablet Take 1 tablet (40 mg total) by mouth daily. 08/31/15   Reyne Dumas, MD  vitamin B-12 (CYANOCOBALAMIN) 1000 MCG tablet Take 1,000 mcg by mouth every morning.    Historical Provider, MD   BP 133/84 mmHg  Pulse 88  Temp(Src) 97.7 F (36.5 C)  Resp 21  SpO2 100% Physical Exam  Constitutional: She appears well-developed and well-nourished. No distress.  HENT:  Head: Normocephalic and atraumatic.  Eyes: Conjunctivae are normal. Right eye exhibits no discharge. Left eye exhibits no discharge.  Neck: Normal range of motion. Neck supple. No JVD present.  Cardiovascular: Normal rate, regular rhythm, normal heart sounds and intact distal pulses.   No murmur heard. Pulmonary/Chest: Effort normal and breath sounds normal. No respiratory  distress.  Abdominal: Soft. Bowel sounds are normal. She exhibits no distension and no mass. There is no tenderness. There is no rebound and no guarding.  Musculoskeletal: She exhibits no edema.  Neurological: She is alert.  Skin: Skin is warm. No rash noted.  Psychiatric: She has a normal mood and affect.  Nursing note and vitals reviewed.  CRANIAL NERVES: CN 2 (Optic): Visual fields grossly intact CN 3,4,6 (EOM): Pupils equal and reactive to light. Full extraocular eye movement without nystagmus. CN 5 (Trigeminal): Facial sensation is normal, no weakness of masticatory muscles. CN 7 (Facial): No facial weakness or asymmetry. CN 8 (Auditory): Auditory acuity grossly normal. CN 9,10 (Glossophar): The uvula is midline, the palate elevates symmetrically. CN 11 (spinal access): Normal sternocleidomastoid and  trapezius strength. CN 12 (Hypoglossal): The tongue is midline. No atrophy or fasciculations.Marland Kitchen   MOTOR: Muscle Strength:  Strength 4+/5 and symmetric in the upper and lower extremities No pronation or drift. Muscle Tone: Tone and muscle bulk are normal in the upper and lower extremities.   REFLEXES: DTRs - 2+ and symmetrical in all four extremities  SENSATION: Intact to light touch in all four extremities  COORDINATION:   Intact finger-to-nose, no tremor.   GAIT: Routine gait is slow with assistance of walker but not favoring a side or ataxic   ED Course  Procedures (including critical care time) Labs Review Labs Reviewed  CBC WITH DIFFERENTIAL/PLATELET - Abnormal; Notable for the following:    WBC 10.7 (*)    Hemoglobin 15.2 (*)    HCT 46.4 (*)    Neutro Abs 8.3 (*)    All other components within normal limits  COMPREHENSIVE METABOLIC PANEL - Abnormal; Notable for the following:    Potassium 3.0 (*)    Chloride 99 (*)    Glucose, Bld 138 (*)    BUN 21 (*)    Creatinine, Ser 1.70 (*)    Albumin 2.8 (*)    AST 367 (*)    ALT 145 (*)    Alkaline Phosphatase 184 (*)     Total Bilirubin 1.3 (*)    GFR calc non Af Amer 26 (*)    GFR calc Af Amer 30 (*)    All other components within normal limits  BRAIN NATRIURETIC PEPTIDE - Abnormal; Notable for the following:    B Natriuretic Peptide 1347.3 (*)    All other components within normal limits  URINALYSIS, ROUTINE W REFLEX MICROSCOPIC (NOT AT [facility]) - Abnormal; Notable for the following:    Glucose, UA 250 (*)    Hgb urine dipstick LARGE (*)    Protein, ur 100 (*)    All other components within normal limits  URINE MICROSCOPIC-ADD ON - Abnormal; Notable for the following:    Squamous Epithelial / LPF 0-5 (*)    Bacteria, UA FEW (*)    Casts GRANULAR CAST (*)    All other components within normal limits  AMMONIA  HEPATITIS PANEL, ACUTE  I-STAT TROPOININ, ED    Imaging Review Dg Chest 2 View  09/24/2015  CLINICAL DATA:  Decreased appetite for 1 month. Chronic shortness of breath. EXAM: CHEST  2 VIEW COMPARISON:  08/30/2015 FINDINGS: Cardiomegaly. No overt edema. No confluent airspace opacities. Small right pleural effusion. No acute bony abnormality. IMPRESSION: Cardiomegaly. Small right pleural effusion. Electronically Signed   By: Rolm Baptise M.D.   On: 09/24/2015 16:32   I have personally reviewed and evaluated these images and lab results as part of my medical decision-making.   EKG Interpretation   Date/Time:  Wednesday September 24 2015 16:25:21 EDT Ventricular Rate:  85 PR Interval:    QRS Duration: 93 QT Interval:  403 QTC Calculation: 480 R Axis:   42 Text Interpretation:  Atrial fibrillation Probable anteroseptal infarct,  old Nonspecific repol abnormality, diffuse leads No significant change was  found Confirmed by CAMPOS  MD, Lennette Bihari ([id number]) on 09/24/2015 4:54:26 PM      MDM   Final diagnoses:  Chronic fatigue    Patient appears to be having a slow decompensation in her physical status. Doubt sepsis as patient is afebrile, no tachycardia and no infectious symptoms. Doubt GI bleed  assignificant hemoglobin drop, denies rectal bleeding or hematemesis. Doubt stroke as patient denies any dizziness or focal  weakness and neuro exam is unremarkable. Patient is able to ambulate and has no pain with bearing weight and no recent falls, doubt pelvic fracture. Patient recently had an MI ( troponin peaked to 7) ) but troponin and EKG are reassuring. Patient also denies any chest pain or shortness of breath except with ambulation for the shortness of breath and has a pleural effusion that is improved from prior chest x-ray in May, at least secondary to her CHF and not an infectious etiology. Urinalysis is not consistent with a infection. Patient ambulated with a walker and already has PT OT daily. She ambulated and maintained 100% saturation on room air during this. Doubt significant CHF exacerbation as patient's BNP was higher during recent admission, decrease in pleural effusions, no shortness of breath with rest and patient satting 100% with ambulation, however, may be the initial stages. Given poor chronic kidney disease, will not advise increase in Lasix as patient is largely asymptomatic. Chest x-ray reassuring otherwise. Liver enzymes are elevated from prior. She has had evaluation for this in the past with an ultrasound which was unremarkable. However, likely requires further evaluation of her liver enzymes, acute hepatitis panel, PT OT, monitoring of her shortness of breath, may need discontinuation of her statin. Eliquis is metabolized to the liver so close attention to this as well. Will admit for further evaluation and management. Suspect symptoms may be related to crest or use with a possible myopathy but will also evaluate for other endocrine myopathy causes. Patient updated with plan and will be admitted and in agreement with plan.  Karma Greaser, MD 09/24/15 [place], MD 09/25/15 [phone]

## 2015-09-24 NOTE — ED Notes (Signed)
Ambulated patient in hallway checking pulse oximetry. Prior to ambulation patient states she has felt very "wobbly on her feet" and required 2 assists. Patient oxygen levels remained at 100% on room air and pulse rate was between 70-80. Patient denies shortness of breath and does appear to have an unsteady gait. No other complaints at this time

## 2015-09-24 NOTE — H&P (Signed)
History and Physical  Nancy [name] [id number] DOB: 08/26/1925 DOA: 09/24/2015  Referring physician: ER Physician PCP: [name], MD  Outpatient Specialists:    Patient coming from: ER  Chief Complaint: Fatigue  HPI: 82 year old female with history of recent NSTEMI, pulmonary hypertension, Valvular heart disease, PAF and CKD. Patient presents with one week of fatigue that has gradually worsened and became severe today. Work up done in ER so far reveals potassium of 3, Scr of 1.7 (up from 1.29), AST of 367 (up from 41), ALT of 145 (up from 29) and BNP of 1347. CXR reveals cardiomegaly. Patient denies fever or chills, no SOB or chest pain, no headache, no neck pain, no GI symptoms or urinary symptoms.  ED Course: As above  Pertinent labs: As above Imaging: independently reviewed.   Review of Systems: As in HPI. Negative for fever, visual changes, sore throat, rash, new muscle aches, chest pain, SOB, dysuria, bleeding, n/v/abdominal pain.  Past Medical History  Diagnosis Date  . Migraine   . GERD (gastroesophageal reflux disease)   . Dyslipidemia   . History of scarlet fever   . PAF (paroxysmal atrial fibrillation) ([place])     a. On Coumadin  . Depression   . Melanoma ([place])   . Mitral regurgitation     a. Echo (11/15):  EF 50-55%, Gr 2 DD, MAC, prolapsing P2 segment of post MV leaflet with mod to possibly severe MR, mild LAE, normal RVF, PASP 35 mmHg;  b. Echo 5/16: Mild LVH, EF 55-60%, trivial AI, moderate MR (LVID, ES 32.3 mm), mild BAE, moderate TR, PASP 48 c. Echo 07/2015:EF 45-50%, moderate to severe MR   . NSTEMI (non-ST elevated myocardial infarction) ([place]) 07/2015    a. Troponin peak of 7.17. Cath w/ normal cors, thought to be 2ry to vasospasm  . CKD (chronic kidney disease), stage III     Past Surgical History  Procedure Laterality Date  . Tonsillectomy    . Appendectomy    . Cataract extraction      bilateral  . Cesarean section    . Cardiac catheterization N/A  07/21/2015    Procedure: Left Heart Cath and Coronary Angiography;  Surgeon: Lorretta Harp, MD;  Location: [facility];  Service: Cardiovascular;  Laterality: N/A;  . Skin biopsy       reports that she quit smoking about 48 years ago. Her smoking use included Cigarettes. She quit after 6 years of use. She does not have any smokeless tobacco history on file. She reports that she drinks about 4.2 oz of alcohol per week. She reports that she does not use illicit drugs.  No Known Allergies  Family History  Problem Relation Age of Onset  . Colon polyps    . Heart attack Neg Hx   . Stroke Neg Hx   . Hypertension Neg Hx      Prior to Admission medications   Medication Sig Start Date End Date Taking? Authorizing Provider  calcium-vitamin D (OSCAL WITH D 500-200) 500-200 MG-UNIT per tablet Take 1 tablet by mouth daily.     Yes Historical Provider, MD  Coenzyme Q10 (CO Q 10 PO) Take 100 mg by mouth daily.    Yes Historical Provider, MD  ergocalciferol (VITAMIN D2) 50000 units capsule Take 50,000 Units by mouth once a week.   Yes Historical Provider, MD  furosemide (LASIX) 20 MG tablet Take 1 tablet (20 mg total) by mouth daily. 08/31/15  Yes Reyne Dumas, MD  metoprolol  tartrate 37.5 MG TABS Take 37.5 mg by mouth 2 (two) times daily. 08/31/15  Yes Reyne Dumas, MD  Multiple Vitamins-Minerals (CENTRUM SILVER PO) Take 1 tablet by mouth daily.   Yes Historical Provider, MD  pantoprazole (PROTONIX) 40 MG tablet Take 1 tablet (40 mg total) by mouth daily. 08/31/15  Yes Reyne Dumas, MD  rosuvastatin (CRESTOR) 40 MG tablet TAKE 1 TABLET (40 MG TOTAL) BY MOUTH DAILY AT 6 PM. 08/11/15  Yes Historical Provider, MD  vitamin B-12 (CYANOCOBALAMIN) 1000 MCG tablet Take 1,000 mcg by mouth every morning.   Yes Historical Provider, MD  apixaban (ELIQUIS) 2.5 MG TABS tablet Take 1 tablet (2.5 mg total) by mouth 2 (two) times daily. 09/04/15   Renee Dyane Dustman, PA-C  nitroGLYCERIN (NITROSTAT) 0.4 MG SL tablet  Place 1 tablet (0.4 mg total) under the tongue every 5 (five) minutes x 3 doses as needed for chest pain. 07/22/15   Erma Heritage, PA  warfarin (COUMADIN) 5 MG tablet Take 2.5-5 mg by mouth See admin instructions. 2.5 mg on Sun/Tues/Thurs/Sat and 5 mg on Mon/Wed/Fri    Historical Provider, MD    Physical Exam: Filed Vitals:   09/24/15 1545 09/24/15 1625 09/24/15 1630 09/24/15 1745  BP: 118/71 115/65 126/68 127/87  Pulse: 83 88 83 80  Temp:      Resp: 16 21 21 16   SpO2: 98% 98% 93% 94%    Constitutional:  . Appears calm and comfortable Eyes:  . No pallor. No jaundice.  ENMT:  . external ears, nose appear normal Neck:  . Neck is supple. Pulsating neck veins to the jaw (History of severe TR) Respiratory:  . Decreased air entry posteriorly . Respiratory effort normal. No retractions or accessory muscle use Cardiovascular:  . S1S2 . No LE extremity edema   Abdomen:  . Abdomen is soft and non tender. Organs are difficult to assess. Neurologic:  . Awake and alert. . Moves all limbs.  Wt Readings from Last 3 Encounters:  09/04/15 59.421 kg (131 lb)  08/31/15 57.425 kg (126 lb 9.6 oz)  08/18/15 59.648 kg (131 lb 8 oz)    I have personally reviewed following labs and imaging studies  Labs on Admission:  CBC:  Recent Labs Lab 09/24/15 1542  WBC 10.7*  NEUTROABS 8.3*  HGB 15.2*  HCT 46.4*  MCV 94.1  PLT 123XX123   Basic Metabolic Panel:  Recent Labs Lab 09/24/15 1542  NA 138  K 3.0*  CL 99*  CO2 30  GLUCOSE 138*  BUN 21*  CREATININE 1.70*  CALCIUM 9.5   Liver Function Tests:  Recent Labs Lab 09/24/15 1542  AST 367*  ALT 145*  ALKPHOS 184*  BILITOT 1.3*  PROT 7.3  ALBUMIN 2.8*   No results for input(s): LIPASE, AMYLASE in the last 168 hours. No results for input(s): AMMONIA in the last 168 hours. Coagulation Profile: No results for input(s): INR, PROTIME in the last 168 hours. Cardiac Enzymes: No results for input(s): CKTOTAL, CKMB, CKMBINDEX,  TROPONINI in the last 168 hours. BNP (last 3 results) No results for input(s): PROBNP in the last 8760 hours. HbA1C: No results for input(s): HGBA1C in the last 72 hours. CBG: No results for input(s): GLUCAP in the last 168 hours. Lipid Profile: No results for input(s): CHOL, HDL, LDLCALC, TRIG, CHOLHDL, LDLDIRECT in the last 72 hours. Thyroid Function Tests: No results for input(s): TSH, T4TOTAL, FREET4, T3FREE, THYROIDAB in the last 72 hours. Anemia Panel: No results for input(s): VITAMINB12, FOLATE, FERRITIN,  TIBC, IRON, RETICCTPCT in the last 72 hours. Urine analysis:    Component Value Date/Time   COLORURINE YELLOW 09/24/2015 [place] 09/24/2015 1542   LABSPEC 1.014 09/24/2015 1542   PHURINE 7.0 09/24/2015 1542   GLUCOSEU 250* 09/24/2015 1542   HGBUR LARGE* 09/24/2015 1542   BILIRUBINUR NEGATIVE 09/24/2015 1542   KETONESUR NEGATIVE 09/24/2015 1542   PROTEINUR 100* 09/24/2015 1542   NITRITE NEGATIVE 09/24/2015 1542   LEUKOCYTESUR NEGATIVE 09/24/2015 1542   Sepsis Labs: @LABRCNTIP (procalcitonin:4,lacticidven:4) )No results found for this or any previous visit (from the past 240 hour(s)).    Radiological Exams on Admission: Dg Chest 2 View  09/24/2015  CLINICAL DATA:  Decreased appetite for 1 month. Chronic shortness of breath. EXAM: CHEST  2 VIEW COMPARISON:  08/30/2015 FINDINGS: Cardiomegaly. No overt edema. No confluent airspace opacities. Small right pleural effusion. No acute bony abnormality. IMPRESSION: Cardiomegaly. Small right pleural effusion. Electronically Signed   By: Rolm Baptise M.D.   On: 09/24/2015 16:32     Active Problems:  - Weakness and fatigue  - Hypokalemia - Elevated liver enzymes -Pulmonary hypertension with severe TR -AKI on CKD - History of combined systolic and diastolic dysfunction -PAF   Assessment/Plan 1. Admit patient 2. RUQ ultrasound 3. AKI work up 4. Monitor and replete abnormal  electrolytes 5. KCL 6. ECHO 7. Consult PT/OT and case management. 8. Cycle cardiac enzymes 9. Check TSH/Free T4  Further management will depend on hospital course.  DVT prophylaxis: On coumadin Code Status: Full Family Communication:  Disposition Plan: Undetermined   Consults called: None. Low threshold to consult cardiology and GI   Admission status: Inpatient    Time spent: 60 minutes  Dana Allan, MD  [facility] Pager #: [phone] 7PM-7AM contact night coverage as above   09/24/2015, 5:52 PM

## 2015-09-24 NOTE — ED Notes (Addendum)
Pt arrives via [facility] from [facility] [place], ptar reports pt is a/o but has times where she "zones out," per staff's report to them. Pt reports fatigue, pt also reports recent change in BP meds and that she was feeling good prior to med change. Pt reports bilateral leg weakness. Face symmetrical, hand grips equal bilaterally. NAD.

## 2015-09-25 ENCOUNTER — Inpatient Hospital Stay (HOSPITAL_COMMUNITY): Payer: Medicare Other

## 2015-09-25 DIAGNOSIS — K8020 Calculus of gallbladder without cholecystitis without obstruction: Secondary | ICD-10-CM | POA: Diagnosis present

## 2015-09-25 DIAGNOSIS — R945 Abnormal results of liver function studies: Secondary | ICD-10-CM

## 2015-09-25 DIAGNOSIS — N184 Chronic kidney disease, stage 4 (severe): Secondary | ICD-10-CM | POA: Diagnosis present

## 2015-09-25 DIAGNOSIS — T733XXD Exhaustion due to excessive exertion, subsequent encounter: Secondary | ICD-10-CM

## 2015-09-25 DIAGNOSIS — R0602 Shortness of breath: Secondary | ICD-10-CM

## 2015-09-25 DIAGNOSIS — I5043 Acute on chronic combined systolic (congestive) and diastolic (congestive) heart failure: Secondary | ICD-10-CM | POA: Diagnosis present

## 2015-09-25 DIAGNOSIS — R7989 Other specified abnormal findings of blood chemistry: Secondary | ICD-10-CM | POA: Diagnosis present

## 2015-09-25 DIAGNOSIS — K8021 Calculus of gallbladder without cholecystitis with obstruction: Secondary | ICD-10-CM

## 2015-09-25 DIAGNOSIS — I480 Paroxysmal atrial fibrillation: Secondary | ICD-10-CM | POA: Diagnosis present

## 2015-09-25 LAB — MRSA PCR SCREENING: MRSA by PCR: NEGATIVE

## 2015-09-25 LAB — HEPATIC FUNCTION PANEL
ALT: 187 U/L — ABNORMAL HIGH (ref 14–54)
AST: 518 U/L — ABNORMAL HIGH (ref 15–41)
Albumin: 2.7 g/dL — ABNORMAL LOW (ref 3.5–5.0)
Alkaline Phosphatase: 176 U/L — ABNORMAL HIGH (ref 38–126)
Bilirubin, Direct: 0.7 mg/dL — ABNORMAL HIGH (ref 0.1–0.5)
Indirect Bilirubin: 0.6 mg/dL (ref 0.3–0.9)
Total Bilirubin: 1.3 mg/dL — ABNORMAL HIGH (ref 0.3–1.2)
Total Protein: 7 g/dL (ref 6.5–8.1)

## 2015-09-25 LAB — CBC
HCT: 45.1 % (ref 36.0–46.0)
Hemoglobin: 14.8 g/dL (ref 12.0–15.0)
MCH: 31.1 pg (ref 26.0–34.0)
MCHC: 32.8 g/dL (ref 30.0–36.0)
MCV: 94.7 fL (ref 78.0–100.0)
Platelets: 299 10*3/uL (ref 150–400)
RBC: 4.76 MIL/uL (ref 3.87–5.11)
RDW: 15.4 % (ref 11.5–15.5)
WBC: 10.8 10*3/uL — ABNORMAL HIGH (ref 4.0–10.5)

## 2015-09-25 LAB — COMPREHENSIVE METABOLIC PANEL
ALT: 157 U/L — ABNORMAL HIGH (ref 14–54)
AST: 429 U/L — ABNORMAL HIGH (ref 15–41)
Albumin: 2.6 g/dL — ABNORMAL LOW (ref 3.5–5.0)
Alkaline Phosphatase: 185 U/L — ABNORMAL HIGH (ref 38–126)
Anion gap: 7 (ref 5–15)
BILIRUBIN TOTAL: 1.5 mg/dL — AB (ref 0.3–1.2)
BUN: 21 mg/dL — AB (ref 6–20)
CALCIUM: 8.9 mg/dL (ref 8.9–10.3)
CO2: 28 mmol/L (ref 22–32)
Chloride: 104 mmol/L (ref 101–111)
Creatinine, Ser: 1.74 mg/dL — ABNORMAL HIGH (ref 0.44–1.00)
GFR calc Af Amer: 29 mL/min — ABNORMAL LOW (ref >60)
GFR, EST NON AFRICAN AMERICAN: 25 mL/min — AB (ref >60)
GLUCOSE: 160 mg/dL — AB (ref 65–99)
Potassium: 4.1 mmol/L (ref 3.5–5.1)
Sodium: 139 mmol/L (ref 135–145)
TOTAL PROTEIN: 6.9 g/dL (ref 6.5–8.1)

## 2015-09-25 LAB — RENAL FUNCTION PANEL
Albumin: 2.4 g/dL — ABNORMAL LOW (ref 3.5–5.0)
Anion gap: 6 (ref 5–15)
BUN: 19 mg/dL (ref 6–20)
CO2: 30 mmol/L (ref 22–32)
Calcium: 8.9 mg/dL (ref 8.9–10.3)
Chloride: 104 mmol/L (ref 101–111)
Creatinine, Ser: 1.71 mg/dL — ABNORMAL HIGH (ref 0.44–1.00)
GFR calc Af Amer: 30 mL/min — ABNORMAL LOW (ref >60)
GFR calc non Af Amer: 26 mL/min — ABNORMAL LOW (ref >60)
Glucose, Bld: 133 mg/dL — ABNORMAL HIGH (ref 65–99)
Phosphorus: 1.3 mg/dL — ABNORMAL LOW (ref 2.5–4.6)
Potassium: 3.5 mmol/L (ref 3.5–5.1)
Sodium: 140 mmol/L (ref 135–145)

## 2015-09-25 LAB — HEPATITIS PANEL, ACUTE
HEP B S AG: NEGATIVE
Hep A IgM: NEGATIVE
Hep B C IgM: NEGATIVE

## 2015-09-25 LAB — TROPONIN I
Troponin I: 0.09 ng/mL — ABNORMAL HIGH (ref <0.031)
Troponin I: 0.09 ng/mL — ABNORMAL HIGH (ref <0.031)

## 2015-09-25 MED ORDER — ASPIRIN EC 81 MG PO TBEC
81.0000 mg | DELAYED_RELEASE_TABLET | Freq: Every day | ORAL | Status: DC
  Administered 2015-09-25 – 2015-09-30 (×3): 81 mg via ORAL
  Filled 2015-09-25 (×5): qty 1

## 2015-09-25 MED ORDER — POTASSIUM CHLORIDE CRYS ER 10 MEQ PO TBCR
10.0000 meq | EXTENDED_RELEASE_TABLET | Freq: Two times a day (BID) | ORAL | Status: DC
Start: 2015-09-25 — End: 2015-09-26
  Filled 2015-09-25 (×2): qty 1

## 2015-09-25 MED ORDER — FUROSEMIDE 10 MG/ML IJ SOLN
40.0000 mg | Freq: Two times a day (BID) | INTRAMUSCULAR | Status: DC
  Administered 2015-09-25: 40 mg via INTRAVENOUS
  Filled 2015-09-25 (×2): qty 4

## 2015-09-25 NOTE — Progress Notes (Signed)
BP 91/76, HR 55. Fredirick Maudlin (night coverage) paged at 2239 about 2200 dose of metoprolol 37.5mg . Call back received from [facility] at 2245 stating to hold medication. Medication not given. Will continue to monitor patient.

## 2015-09-25 NOTE — Progress Notes (Signed)
Occupational Therapy Evaluation Patient Details Name: Nancy [name] MRN: [id number] DOB: 23-Oct-1925 Today's Date: 09/25/2015    History of Present Illness Patient is a 82 y/o female with hx of dyslipidemia, PAF, depression, CKD, mitral regurgitation and migraine presents with worsening fatigue. CXR- small right pleural effusion. Found to have Gallstones without ultrasound findings of gallbladder inflammation.   Clinical Impression   PTA, pt from [facility] and was independent with ADLs and used RW for mobility. Pt currently presents with generalized weakness, fatigue, deconditioning, and bilateral groin area pain impacting ADL performance. Pt able to complete 2 grooming tasks at sink with 2 rest breaks. Pt required min guard assist for transfers and ambulation due to increased fatigue. HR in 100's and SaO2  At 94% on RA during activity. Pt will benefit from continued acute OT to increase independence and safety with ADLs and mobility to allow safe return to ALF.     Follow Up Recommendations  Home health OT;Supervision/Assistance - 24 hour    Equipment Recommendations  3 in 1 bedside comode    Recommendations for Other Services       Precautions / Restrictions Precautions Precautions: Fall Restrictions Weight Bearing Restrictions: No      Mobility Bed Mobility Overal bed mobility: Needs Assistance Bed Mobility: Sit to Supine     Supine to sit: Min assist;HOB elevated Sit to supine: Min assist   General bed mobility comments: HOB flat, no use of bedrails. Assist to move BLE onto bed.  Transfers Overall transfer level: Needs assistance Equipment used: Rolling walker (2 wheeled) Transfers: Sit to/from Stand Sit to Stand: Min guard         General transfer comment: Min guard assist for balance and safety. PT with increased fatigue immediately upon standing. VCs x3 for safe hand placement as pt pulling up on RW.     Balance Overall balance assessment: Needs  assistance Sitting-balance support: No upper extremity supported;Feet supported Sitting balance-Leahy Scale: Good     Standing balance support: Bilateral upper extremity supported;During functional activity Standing balance-Leahy Scale: Poor Standing balance comment: Reliant on UE support for balance even for static standing tasks due to severe fatigue                            ADL Overall ADL's : Needs assistance/impaired Eating/Feeding: Set up;Sitting   Grooming: Wash/dry hands;Oral care;Brushing hair;Min guard;Standing Grooming Details (indicate cue type and reason): x2 rest breaks Upper Body Bathing: Min guard;Sitting   Lower Body Bathing: Minimal assistance;Sit to/from stand Lower Body Bathing Details (indicate cue type and reason): to reach LB due to fatigue Upper Body Dressing : Min guard;Sitting   Lower Body Dressing: Minimal assistance;Sit to/from stand Lower Body Dressing Details (indicate cue type and reason): to reach LB due to fatigue Toilet Transfer: Min guard;Cueing for safety;Ambulation;BSC;RW   Toileting- Water quality scientist and Hygiene: Min guard;Sit to/from stand       Functional mobility during ADLs: Min guard;Rolling walker General ADL Comments: Fatigues very quickly and needs frequent rest breaks. Began energy conservation education.     Vision Vision Assessment?: No apparent visual deficits   Perception     Praxis      Pertinent Vitals/Pain Pain Assessment: Faces Faces Pain Scale: Hurts little more Pain Location: bilateral groin/anterior thigh area Pain Descriptors / Indicators: Sore Pain Intervention(s): Repositioned;Monitored during session     Hand Dominance Right   Extremity/Trunk Assessment Upper Extremity Assessment Upper Extremity Assessment: Generalized weakness  Lower Extremity Assessment Lower Extremity Assessment: Generalized weakness   Cervical / Trunk Assessment Cervical / Trunk Assessment: Kyphotic    Communication Communication Communication: No difficulties   Cognition Arousal/Alertness: Awake/alert Behavior During Therapy: WFL for tasks assessed/performed Overall Cognitive Status: Within Functional Limits for tasks assessed                     General Comments       Exercises       Shoulder Instructions      Home Living Family/patient expects to be discharged to:: Assisted living                             Home Equipment: Walker - 2 wheels;Grab bars - toilet          Prior Functioning/Environment Level of Independence: Independent with assistive device(s)        Comments: Sponge bathes standing at sink, uses RW, walks to dining hall, receives PT/OT everyday at ALF    OT Diagnosis: Generalized weakness;Acute pain   OT Problem List: Decreased strength;Decreased activity tolerance;Impaired balance (sitting and/or standing);Decreased safety awareness;Decreased knowledge of use of DME or AE;Pain   OT Treatment/Interventions: Self-care/ADL training;Therapeutic exercise;Energy conservation;DME and/or AE instruction;Therapeutic activities;Patient/family education;Balance training    OT Goals(Current goals can be found in the care plan section) Acute Rehab OT Goals Patient Stated Goal: "to find out what is wrong with me" OT Goal Formulation: With patient Time For Goal Achievement: 10/09/15 Potential to Achieve Goals: Good ADL Goals Pt Will Perform Upper Body Bathing: sitting;with supervision Pt Will Perform Lower Body Bathing: sit to/from stand;with supervision Pt Will Transfer to Toilet: with supervision;ambulating;regular height toilet;grab bars Pt Will Perform Toileting - Clothing Manipulation and hygiene: with supervision;sitting/lateral leans;sit to/from stand Additional ADL Goal #1: Pt will utilize 2 energy conservation strategies during an ADL task with no verbal cues.  OT Frequency: Min 2X/week   Barriers to D/C:             Co-evaluation              End of Session Equipment Utilized During Treatment: Gait belt;Rolling walker Nurse Communication: Mobility status  Activity Tolerance: Patient limited by fatigue Patient left: in bed;with call bell/phone within reach;with bed alarm set   Time: [phone] OT Time Calculation (min): 25 min Charges:  OT General Charges $OT Visit: 1 Procedure OT Evaluation $OT Eval Moderate Complexity: 1 Procedure OT Treatments $Self Care/Home Management : 8-22 mins G-Codes:    Redmond Baseman, OTR/L Pager: [phone] 09/25/2015, 10:38 AM

## 2015-09-25 NOTE — Care Management Obs Status (Signed)
[facility] NOTIFICATION   Patient Details  Name: Nancy [name] MRN: [id number] Date of Birth: 11-02-1925  CC44 paperwork reviewed with patient   Medicare Observation Status Notification Given:  Yes    Carles Collet, RN 09/25/2015, 1:37 PM

## 2015-09-25 NOTE — Progress Notes (Signed)
Progress Note    Nancy [name]  UEA:[id number] DOB: 02/27/1926  DOA: 09/24/2015 PCP: Gwen Pounds, MD    Brief Narrative:   Nancy [name] is an 82 y.o. female with a PMH of recent non-STEMI, pulmonary hypertension, valvular heart disease, PAF and stage III chronic kidney disease who was admitted 09/24/15 with a chief complaint of worsening fatigue. Upon initial evaluation in the ED, patient was found to have elevated LFTs. A right upper quadrant ultrasound showed gallstones without ultrasound findings of gallbladder inflammation. No abnormalities of the liver were noted.  Assessment/Plan:   Principal Problem:   Elevated LFTs/cholelithiasis Ultrasound negative for gallbladder thickening.  No RUQ tenderness.  Will get MRCP to evaluate hepatobiliary tree. May also be secondary to passive liver congestion given h/o CHF.  Active Problems:   Acute on chronic systolic and diastolic CHF / demand ischemia EF 45% and grade III diastolic dysfunction 07/22/15.  CXR on admission showed cardiomegaly and a small right pleural effusion.  BNP 1347.  Will diurese. Focused order set for heart failure placed. Strict I/O, daily weights. Troponins mildly elevated with flat trend.  Add ASA.     PAF Continue Eliquis and metoprolol.    SOB (shortness of breath) No current complaints of dyspnea.    Fatigue Suspect from decompensated CHF.  PT/OT evaluations.    Family Communication/Anticipated D/C date and plan/Code Status   DVT prophylaxis:  Eliquis ordered. Code Status: Full Code.  Family Communication: Nancy [name], son, updated by telephone. Disposition Plan: Home in 2-3 days once symptoms fully evaluated and improved.   Medical Consultants:    None.   Procedures:   2 D Echo  Anti-Infectives:   Anti-infectives    None      Subjective:    Nancy [name] is sitting up in the chair, but is very sleepy.  Says she feels fatigued.  Mildy dyspneic.  No chest pain.  Objective:    Filed  Vitals:   09/24/15 2106 09/25/15 0512 09/25/15 1010 09/25/15 1409  BP:  112/76 123/69 110/81  Pulse: 88 51 89 54  Temp: 97.6 F (36.4 C) 97.8 F (36.6 C)  97.5 F (36.4 C)  TempSrc: Oral Oral    Resp: 16 18  19   Height:      Weight:      SpO2: 99% 96%  100%    Intake/Output Summary (Last 24 hours) at 09/25/15 1855 Last data filed at 09/25/15 1842  Gross per 24 hour  Intake    680 ml  Output      1 ml  Net    679 ml   Filed Weights   09/24/15 1832  Weight: 55.7 kg (122 lb 12.7 oz)    Exam: General exam: Appears calm and comfortable.  Respiratory system: Diminished breath sounds, poor effort. Cardiovascular system: HSIR. Hyperdynamic precordium. No JVD,  rubs, gallops or clicks. No murmurs. Gastrointestinal system: Abdomen is nondistended, soft and nontender. No organomegaly or masses felt. Normal bowel sounds heard. Central nervous system: Sleepy, oriented x 2. No focal neurological deficits. Extremities: No clubbing, edema, or cyanosis. Skin: No rashes, lesions or ulcers Psychiatry: Judgement and insight appear normal. Mood & affect flat.   Data Reviewed:   I have personally reviewed following labs and imaging studies:  Labs: Basic Metabolic Panel:  Recent Labs Lab 09/24/15 1542 09/24/15 1928 09/25/15 0120 09/25/15 0855  NA 138  --  140 139  K 3.0*  --  3.5 4.1  CL 99*  --  104  104  CO2 30  --  30 28  GLUCOSE 138*  --  133* 160*  BUN 21*  --  19 21*  CREATININE 1.70*  --  1.71* 1.74*  CALCIUM 9.5  --  8.9 8.9  MG  --  2.2  --   --   PHOS  --  2.0* 1.3*  --    GFR Estimated Creatinine Clearance: 20 mL/min (by C-G formula based on Cr of 1.74). Liver Function Tests:  Recent Labs Lab 09/24/15 1542 09/25/15 0120 09/25/15 0855  AST 367*  --  429*  ALT 145*  --  157*  ALKPHOS 184*  --  185*  BILITOT 1.3*  --  1.5*  PROT 7.3  --  6.9  ALBUMIN 2.8* 2.4* 2.6*   No results for input(s): LIPASE, AMYLASE in the last 168 hours.  Recent Labs Lab  09/24/15 1722  AMMONIA 38*   Coagulation profile No results for input(s): INR, PROTIME in the last 168 hours.  CBC:  Recent Labs Lab 09/24/15 1542 09/25/15 0645  WBC 10.7* 10.8*  NEUTROABS 8.3*  --   HGB 15.2* 14.8  HCT 46.4* 45.1  MCV 94.1 94.7  PLT 299 299   Cardiac Enzymes:  Recent Labs Lab 09/24/15 1722 09/24/15 1928 09/25/15 0118 09/25/15 0645  CKTOTAL 7045*  --   --   --   TROPONINI  --  0.09* 0.09* 0.09*   BNP (last 3 results) No results for input(s): PROBNP in the last 8760 hours. CBG: No results for input(s): GLUCAP in the last 168 hours. D-Dimer: No results for input(s): DDIMER in the last 72 hours. Hgb A1c: No results for input(s): HGBA1C in the last 72 hours. Lipid Profile: No results for input(s): CHOL, HDL, LDLCALC, TRIG, CHOLHDL, LDLDIRECT in the last 72 hours. Thyroid function studies:  Recent Labs  09/24/15 1737  TSH 1.875   Anemia work up: No results for input(s): VITAMINB12, FOLATE, FERRITIN, TIBC, IRON, RETICCTPCT in the last 72 hours. Sepsis Labs:  Recent Labs Lab 09/24/15 1542 09/25/15 0645  WBC 10.7* 10.8*   Urine analysis:    Component Value Date/Time   COLORURINE YELLOW 09/24/2015 1542   APPEARANCEUR CLEAR 09/24/2015 1542   LABSPEC 1.014 09/24/2015 1542   PHURINE 7.0 09/24/2015 1542   GLUCOSEU 250* 09/24/2015 1542   HGBUR LARGE* 09/24/2015 1542   BILIRUBINUR NEGATIVE 09/24/2015 1542   KETONESUR NEGATIVE 09/24/2015 1542   PROTEINUR 100* 09/24/2015 1542   NITRITE NEGATIVE 09/24/2015 1542   LEUKOCYTESUR NEGATIVE 09/24/2015 1542   Microbiology Recent Results (from the past 240 hour(s))  MRSA PCR Screening     Status: None   Collection Time: 09/25/15  5:17 AM  Result Value Ref Range Status   MRSA by PCR NEGATIVE NEGATIVE Final    Comment:        The GeneXpert MRSA Assay (FDA approved for NASAL specimens only), is one component of a comprehensive MRSA colonization surveillance program. It is not intended to  diagnose MRSA infection nor to guide or monitor treatment for MRSA infections.     Radiology: Dg Chest 2 View  09/24/2015  CLINICAL DATA:  Decreased appetite for 1 month. Chronic shortness of breath. EXAM: CHEST  2 VIEW COMPARISON:  08/30/2015 FINDINGS: Cardiomegaly. No overt edema. No confluent airspace opacities. Small right pleural effusion. No acute bony abnormality. IMPRESSION: Cardiomegaly. Small right pleural effusion. Electronically Signed   By: Charlett Nose M.D.   On: 09/24/2015 16:32   US Renal  09/25/2015  CLINICAL DATA:  82 year old female with acute kidney insufficiency. Subsequent encounter. EXAM: RENAL / URINARY TRACT ULTRASOUND COMPLETE COMPARISON:  08/30/2015 ultrasound. FINDINGS: Right Kidney: Length: 9 cm. Increased echogenicity consistent with changes of medical renal disease. No hydronephrosis or mass. Left Kidney: Length: 9 cm. Increased echogenicity consistent with changes medical renal disease. No hydronephrosis or mass. Bladder: Appears normal for degree of bladder distention. Right-sided pleural effusion IMPRESSION: Bilateral increased renal echogenicity consistent with changes of medical renal disease. No hydronephrosis. Right-sided pleural effusion. Electronically Signed   By: Lacy Duverney M.D.   On: 09/25/2015 07:51   US Abdomen Limited Ruq  09/25/2015  CLINICAL DATA:  82 year old female with acute kidney insufficiency, elevated liver enzymes, weakness and fatigue. Subsequent encounter. EXAM: US ABDOMEN LIMITED - RIGHT UPPER QUADRANT COMPARISON:  08/30/2015 ultrasound. FINDINGS: Gallbladder: Mobile gallstones measuring up to 8 mm. Phrygian cap incidentally noted. No gallbladder wall thickening or pericholecystic fluid. Common bile duct: Diameter: 4 mm proximally. Mid to distal aspect not visualized secondary to bowel gas. Liver: No focal lesion identified. Within normal limits in parenchymal echogenicity. Right-sided pleural effusion. IMPRESSION: Gallstones without  ultrasound findings of gallbladder inflammation. No liver abnormality noted. Right-sided pleural effusion. Electronically Signed   By: Lacy Duverney M.D.   On: 09/25/2015 07:01    Medications:   . apixaban  2.5 mg Oral BID  . metoprolol tartrate  37.5 mg Oral BID  . pantoprazole  40 mg Oral Daily  . vitamin B-12  1,000 mcg Oral q morning - 10a   Continuous Infusions:   Time spent: 35 minutes with > 50% of time discussing current diagnostic test results, clinical impression and plan of care.   LOS: 1 day   Nancy [name]  [facility] Pager [phone]. If unable to reach me by pager, please call my cell phone at [phone].  *Please refer to [email or web address], password [email or web address] to get updated schedule on who will round on this patient, as hospitalists switch teams weekly. If 7PM-7AM, please contact night-coverage at [email or web address], password [email or web address] for any overnight needs.  09/25/2015, 6:55 PM

## 2015-09-25 NOTE — Discharge Instructions (Signed)
Weakness Weakness is a lack of strength. It may be felt all over the body (generalized) or in one specific part of the body (focal). Some causes of weakness can be serious. You may need further medical evaluation, especially if you are elderly or you have a history of immunosuppression (such as chemotherapy or HIV), kidney disease, heart disease, or diabetes. CAUSES  Weakness can be caused by many different things, including:  Infection.  Physical exhaustion.  Internal bleeding or other blood loss that results in a lack of red blood cells (anemia).  Dehydration. This cause is more common in elderly people.  Side effects or electrolyte abnormalities from medicines, such as pain medicines or sedatives.  Emotional distress, anxiety, or depression.  Circulation problems, especially severe peripheral arterial disease.  Heart disease, such as rapid atrial fibrillation, bradycardia, or heart failure.  Nervous system disorders, such as Guillain-Barr syndrome, multiple sclerosis, or stroke. DIAGNOSIS  To find the cause of your weakness, your caregiver will take your history and perform a physical exam. Lab tests or X-rays may also be ordered, if needed. TREATMENT  Treatment of weakness depends on the cause of your symptoms and can vary greatly. HOME CARE INSTRUCTIONS   Rest as needed.  Eat a well-balanced diet.  Try to get some exercise every day.  Only take over-the-counter or prescription medicines as directed by your caregiver. SEEK MEDICAL CARE IF:   Your weakness seems to be getting worse or spreads to other parts of your body.  You develop new aches or pains. SEEK IMMEDIATE MEDICAL CARE IF:   You cannot perform your normal daily activities, such as getting dressed and feeding yourself.  You cannot walk up and down stairs, or you feel exhausted when you do so.  You have shortness of breath or chest pain.  You have difficulty moving parts of your body.  You have weakness  in only one area of the body or on only one side of the body.  You have a fever.  You have trouble speaking or swallowing.  You cannot control your bladder or bowel movements.  You have black or bloody vomit or stools. MAKE SURE YOU:  Understand these instructions.  Will watch your condition.  Will get help right away if you are not doing well or get worse.   This information is not intended to replace advice given to you by your health care provider. Make sure you discuss any questions you have with your health care provider.   Document Released: 03/22/2005 Document Revised: 09/21/2011 Document Reviewed: 05/21/2011 Elsevier Interactive Patient Education 2016 [place] on my medicine - ELIQUIS (apixaban)  This medication education was reviewed with me or my healthcare representative as part of my discharge preparation.  The pharmacist that spoke with me during my hospital stay was:  Duayne Cal, [facility]  Why was Eliquis prescribed for you? Eliquis was prescribed for you to reduce the risk of a blood clot forming that can cause a stroke if you have a medical condition called atrial fibrillation (a type of irregular heartbeat).  What do You need to know about Eliquis ? Take your Eliquis TWICE DAILY - one tablet in the morning and one tablet in the evening with or without food. If you have difficulty swallowing the tablet whole please discuss with your pharmacist how to take the medication safely.  Take Eliquis exactly as prescribed by your doctor and DO NOT stop taking Eliquis without talking to the doctor who prescribed the  medication.  Stopping may increase your risk of developing a stroke.  Refill your prescription before you run out.  After discharge, you should have regular check-up appointments with your healthcare provider that is prescribing your Eliquis.  In the future your dose may need to be changed if your kidney function or weight changes by a  significant amount or as you get older.  What do you do if you miss a dose? If you miss a dose, take it as soon as you remember on the same day and resume taking twice daily.  Do not take more than one dose of ELIQUIS at the same time to make up a missed dose.  Important Safety Information A possible side effect of Eliquis is bleeding. You should call your healthcare provider right away if you experience any of the following: ? Bleeding from an injury or your nose that does not stop. ? Unusual colored urine (red or dark brown) or unusual colored stools (red or black). ? Unusual bruising for unknown reasons. ? A serious fall or if you hit your head (even if there is no bleeding).  Some medicines may interact with Eliquis and might increase your risk of bleeding or clotting while on Eliquis. To help avoid this, consult your healthcare provider or pharmacist prior to using any new prescription or non-prescription medications, including herbals, vitamins, non-steroidal anti-inflammatory drugs (NSAIDs) and supplements.  This website has more information on Eliquis (apixaban): http://www.eliquis.com/eliquis/home

## 2015-09-25 NOTE — Evaluation (Signed)
Physical Therapy Evaluation Patient Details Name: Nancy [name] MRN: [id number] DOB: 1926-01-13 Today's Date: 09/25/2015   History of Present Illness  Patient is a 82 y/o female with hx of dyslipidemia, PAF, depression, CKD, mitral regurgitation and migraine presents with worsening fatigue. CXR- small right pleural effusion. Found to have Gallstones without ultrasound findings of gallbladder inflammation.  Clinical Impression  Patient presents with generalized weakness, fatigue, deconditioning and pain in bil groins impacting mobility. Tolerated gait training with Min A for balance. Fatigues very quickly and frustrated because she does not know what is wrong. Pt from [facility] and was Mod I with RW PTA. Reports no falls. Will follow acutely to maximize independence and mobility prior to return home pending ALF can care for pt at this level.    Follow Up Recommendations Home health PT;Supervision for mobility/OOB (if ALF can provide this level of care)    Equipment Recommendations  None recommended by PT    Recommendations for Other Services OT consult     Precautions / Restrictions Precautions Precautions: Fall Restrictions Weight Bearing Restrictions: No      Mobility  Bed Mobility Overal bed mobility: Needs Assistance Bed Mobility: Supine to Sit     Supine to sit: Min assist;HOB elevated     General bed mobility comments: Assist to elevate trunk to get to EOB.   Transfers Overall transfer level: Needs assistance Equipment used: Rolling walker (2 wheeled) Transfers: Sit to/from Stand Sit to Stand: Min assist         General transfer comment: Min A to boost from EOB x1, Min guard to stand from [facility], cues for hand placement. Transferred to chair post ambulation bout.  Ambulation/Gait Ambulation/Gait assistance: Min assist Ambulation Distance (Feet): 16 Feet (x2 bouts) Assistive device: Rolling walker (2 wheeled) Gait Pattern/deviations: Step-through  pattern;Decreased stride length;Trunk flexed Gait velocity: decreased   General Gait Details: Slow, unsteady gait with increased hip/knee flexion bilaterally; RW too far to CoM, cues for RW management. Fatigues.  Stairs            Wheelchair Mobility    Modified Rankin (Stroke Patients Only)       Balance Overall balance assessment: Needs assistance Sitting-balance support: Feet supported;No upper extremity supported Sitting balance-Leahy Scale: Good     Standing balance support: During functional activity Standing balance-Leahy Scale: Poor Standing balance comment: Reilant on BUEs for support.                             Pertinent Vitals/Pain Pain Assessment: Faces Faces Pain Scale: Hurts even more Pain Location: bil groins/anterior thighs Pain Descriptors / Indicators: Sore Pain Intervention(s): Monitored during session    Home Living Family/patient expects to be discharged to:: Assisted living ([facility] [place] [facility])               Home Equipment: Dan Humphreys - 2 wheels      Prior Function Level of Independence: Independent with assistive device(s)         Comments: Getting PT/OT at ALF. Using RW for ambulation. 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     Hand Dominance        Extremity/Trunk Assessment   Upper Extremity Assessment: Defer to OT evaluation           Lower Extremity Assessment: Generalized weakness         Communication   Communication: No difficulties  Cognition Arousal/Alertness: Awake/alert Behavior During Therapy: WFL for tasks  assessed/performed Overall Cognitive Status: Within Functional Limits for tasks assessed                      General Comments      Exercises        Assessment/Plan    PT Assessment Patient needs continued PT services  PT Diagnosis Generalized weakness;Difficulty walking   PT Problem List Decreased strength;Decreased activity tolerance;Decreased  balance;Decreased mobility;Pain  PT Treatment Interventions Balance training;Gait training;Functional mobility training;Therapeutic activities;Therapeutic exercise;Patient/family education;DME instruction   PT Goals (Current goals can be found in the Care Plan section) Acute Rehab PT Goals Patient Stated Goal: "to find out what is wrong with me" PT Goal Formulation: With patient Time For Goal Achievement: 10/09/15 Potential to Achieve Goals: Fair    Frequency Min 3X/week   Barriers to discharge Decreased caregiver support      Co-evaluation               End of Session Equipment Utilized During Treatment: Gait belt Activity Tolerance: Patient limited by fatigue Patient left: in chair;with call bell/phone within reach;with chair alarm set Nurse Communication: Mobility status         Time: 4098-1191 PT Time Calculation (min) (ACUTE ONLY): 26 min   Charges:   PT Evaluation $PT Eval Moderate Complexity: 1 Procedure PT Treatments $Gait Training: 8-22 mins   PT G Codes:        Nancy [name] 09/25/2015, 8:38 AM Nancy [name], PT, DPT [phone]

## 2015-09-25 NOTE — Clinical Social Work Note (Signed)
Clinical Social Work Assessment  Patient Details  Name: Nancy [name] MRN: [id number] Date of Birth: 05-06-1925  Date of referral:  09/25/15               Reason for consult:  Facility Placement                Permission sought to share information with:  Facility Sport and exercise psychologist, Family Supports Permission granted to share information::  Yes, Verbal Permission Granted  Name::     [place]::  [facility]  Relationship::  Son  Contact Information:  [phone]  Housing/Transportation Living arrangements for the past 2 months:  [place] of Information:  Patient Patient Interpreter Needed:  None Criminal Activity/Legal Involvement Pertinent to Current Situation/Hospitalization:  No - Comment as needed Significant Relationships:  Adult Children Lives with:  Self Do you feel safe going back to the place where you live?  Yes Need for family participation in patient care:  Yes (Comment)  Care giving concerns:  CSW received consult regarding return to ALF. CSW spoke with patient at bedside. She stated she is from [facility] and plans to return there at discharge. She has had PT there before but she reported that "it didn't help very much".   Social Worker assessment / plan:  Patient to return to [facility] at discharge.  Employment status:  Retired Forensic scientist:  Medicare PT Recommendations:  Home with [place] / Referral to community resources:     Patient/Family's Response to care:  Patient expresses agreement with discharge plan and is eager to feel better.  Patient/Family's Understanding of and Emotional Response to Diagnosis, Current Treatment, and Prognosis:  No questions/concerns.  Emotional Assessment Appearance:  Appears stated age Attitude/Demeanor/Rapport:  Other (Appropriate) Affect (typically observed):  Accepting, Appropriate Orientation:  Oriented to Situation, Oriented to  Time, Oriented to Place,  Oriented to Self Alcohol / Substance use:  Not Applicable Psych involvement (Current and /or in the community):  No (Comment)  Discharge Needs  Concerns to be addressed:  Care Coordination Readmission within the last 30 days:  Yes Current discharge risk:  None Barriers to Discharge:  Continued Medical Work up   [employer], [place] 09/25/2015, 1:19 PM

## 2015-09-25 NOTE — Care Management Note (Addendum)
Case Management Note  Patient Details  Name: Nancy [name] MRN: [id number] Date of Birth: 1925-04-17  Subjective/Objective:                 Patient in admitted with elevated LFTs, weakness and SOB. From [facility]. Will DC to ALF with HH.   Action/Plan:  HH referral made to [place] for [facility] PT OT RN [facility].  Expected Discharge Date:  09/27/15               Expected Discharge Plan:  [facility]  In-House Referral:     Discharge planning Services  CM Consult  Post Acute Care Choice:    Choice offered to:     DME Arranged:    DME Agency:     HH Arranged:    HH Agency:     Status of Service:  In process, will continue to follow  If discussed at Long Length of Stay Meetings, dates discussed:    Additional Comments:  Carles Collet, RN 09/25/2015, 2:30 PM

## 2015-09-26 ENCOUNTER — Observation Stay (HOSPITAL_COMMUNITY): Payer: Medicare Other

## 2015-09-26 ENCOUNTER — Observation Stay (HOSPITAL_BASED_OUTPATIENT_CLINIC_OR_DEPARTMENT_OTHER): Payer: Medicare Other

## 2015-09-26 DIAGNOSIS — R935 Abnormal findings on diagnostic imaging of other abdominal regions, including retroperitoneum: Secondary | ICD-10-CM | POA: Diagnosis not present

## 2015-09-26 DIAGNOSIS — K8020 Calculus of gallbladder without cholecystitis without obstruction: Secondary | ICD-10-CM | POA: Diagnosis not present

## 2015-09-26 DIAGNOSIS — I340 Nonrheumatic mitral (valve) insufficiency: Secondary | ICD-10-CM

## 2015-09-26 LAB — ECHOCARDIOGRAM COMPLETE
E decel time: 201 msec
E/e' ratio: 15.27
FS: 20 % — AB (ref 28–44)
Height: 66.5 in
IVS/LV PW RATIO, ED: 0.82
LA ID, A-P, ES: 33 mm
LA diam end sys: 33 mm
LA diam index: 2.04 cm/m2
LA vol A4C: 59.8 ml
LA vol index: 35.3 mL/m2
LA vol: 57.2 mL
LV E/e' medial: 15.27
LV E/e'average: 15.27
LV PW d: 9.95 mm — AB (ref 0.6–1.1)
LV e' LATERAL: 7.4 cm/s
LVOT area: 2.27 cm2
LVOT diameter: 17 mm
MV Dec: 201
MV Peak grad: 5 mmHg
MV pk E vel: 113 m/s
PV Reg grad dias: 8 mmHg
PV Reg vel dias: 142 cm/s
Reg peak vel: 275 cm/s
TDI e' lateral: 7.4
TDI e' medial: 5.98
TR max vel: 275 cm/s
VTI: 129 cm
Weight: 1936.52 oz

## 2015-09-26 LAB — BASIC METABOLIC PANEL
Anion gap: 12 (ref 5–15)
BUN: 20 mg/dL (ref 6–20)
CO2: 27 mmol/L (ref 22–32)
Calcium: 8.8 mg/dL — ABNORMAL LOW (ref 8.9–10.3)
Chloride: 101 mmol/L (ref 101–111)
Creatinine, Ser: 1.65 mg/dL — ABNORMAL HIGH (ref 0.44–1.00)
GFR calc Af Amer: 31 mL/min — ABNORMAL LOW (ref >60)
GFR calc non Af Amer: 27 mL/min — ABNORMAL LOW (ref >60)
Glucose, Bld: 107 mg/dL — ABNORMAL HIGH (ref 65–99)
Potassium: 3 mmol/L — ABNORMAL LOW (ref 3.5–5.1)
Sodium: 140 mmol/L (ref 135–145)

## 2015-09-26 MED ORDER — LACTULOSE 10 GM/15ML PO SOLN
10.0000 g | Freq: Two times a day (BID) | ORAL | Status: DC
  Administered 2015-09-27 – 2015-09-30 (×4): 10 g via ORAL
  Filled 2015-09-26 (×6): qty 15

## 2015-09-26 MED ORDER — POTASSIUM CHLORIDE CRYS ER 20 MEQ PO TBCR
40.0000 meq | EXTENDED_RELEASE_TABLET | Freq: Once | ORAL | Status: DC
  Filled 2015-09-26: qty 2

## 2015-09-26 MED ORDER — TRAMADOL HCL 50 MG PO TABS
50.0000 mg | ORAL_TABLET | Freq: Two times a day (BID) | ORAL | Status: DC | PRN
  Administered 2015-09-28 – 2015-09-29 (×2): 50 mg via ORAL
  Filled 2015-09-26 (×5): qty 1

## 2015-09-26 MED ORDER — POTASSIUM CHLORIDE CRYS ER 20 MEQ PO TBCR
20.0000 meq | EXTENDED_RELEASE_TABLET | Freq: Two times a day (BID) | ORAL | Status: DC
  Administered 2015-09-27 – 2015-09-30 (×4): 20 meq via ORAL
  Filled 2015-09-26 (×5): qty 1

## 2015-09-26 NOTE — Progress Notes (Signed)
Echocardiogram 2D Echocardiogram has been performed.  Tresa Res 09/26/2015, 2:29 PM

## 2015-09-26 NOTE — Progress Notes (Signed)
Progress Note    Nancy [name]  [id number] DOB: July 26, 1925  DOA: 09/24/2015 PCP: [name], MD    Brief Narrative:   Nancy [name] is an 82 y.o. female with a PMH of recent non-STEMI, pulmonary hypertension, valvular heart disease, PAF and stage III chronic kidney disease who was admitted 09/24/15 with a chief complaint of worsening fatigue. Upon initial evaluation in the ED, patient was found to have elevated LFTs. A right upper quadrant ultrasound showed gallstones without ultrasound findings of gallbladder inflammation. No abnormalities of the liver were noted.  Assessment/Plan:   Principal Problem:   Elevated LFTs/cholelithiasis Ultrasound negative for gallbladder thickening.  No RUQ tenderness.  Will get MRCP to evaluate hepatobiliary tree. May also be secondary to passive liver congestion given h/o CHF.  Active Problems:   Acute on chronic systolic and diastolic CHF / demand ischemia EF 45% and grade III diastolic dysfunction 0000000.  CXR on admission showed cardiomegaly and a small right pleural effusion.  BNP 1347.  Strict I/O, daily weights. Troponins mildly elevated with flat trend.  Family refusing medications at this time. Follow-up repeat 2-D echocardiogram.    PAF Continue Eliquis and metoprolol.    SOB (shortness of breath) No current complaints of dyspnea.    Fatigue Suspect from decompensated CHF, but would rule out biliary dysfunction.  PT/OT evaluations.  Family Communication/Anticipated D/C date and plan/Code Status   DVT prophylaxis:  Eliquis ordered. Code Status: Full Code.  Family Communication: Denice Paradise, son, updated by telephone 09/25/15, Clair Gulling, other son, updated today. Disposition Plan: Home in 1-2 days once symptoms fully evaluated and improved.   Medical Consultants:    None.   Procedures:   2 D Echo MRCP  Anti-Infectives:   Anti-infectives    None      Subjective:   Nancy [name] continues to feel fatigued but  specifically denies dyspnea, chest pain, or abdominal pain.  Objective:    Filed Vitals:   09/25/15 1010 09/25/15 1409 09/25/15 2200 09/26/15 0517  BP: 123/69 110/81 91/76 131/62  Pulse: 89 54 55 84  Temp:  97.5 F (36.4 C) 98 F (36.7 C) 98 F (36.7 C)  TempSrc:    Oral  Resp:  19 16 16   Height:      Weight:    54.9 kg (121 lb 0.5 oz)  SpO2:  100% 100% 97%    Intake/Output Summary (Last 24 hours) at 09/26/15 0844 Last data filed at 09/25/15 2345  Gross per 24 hour  Intake    680 ml  Output    350 ml  Net    330 ml   Filed Weights   09/24/15 1832 09/26/15 0517  Weight: 55.7 kg (122 lb 12.7 oz) 54.9 kg (121 lb 0.5 oz)    Exam: General exam: Appears calm and comfortable.  Respiratory system: Diminished breath sounds, poor effort. Cardiovascular system: HSIR. Hyperdynamic precordium. No JVD,  rubs, gallops or clicks. No murmurs. Gastrointestinal system: Abdomen is nondistended, soft and nontender. No organomegaly or masses felt. Normal bowel sounds heard. Central nervous system: Sleepy, oriented x 2. No focal neurological deficits. Extremities: No clubbing, edema, or cyanosis. Skin: No rashes, lesions or ulcers Psychiatry: Judgement and insight appear normal. Mood & affect flat.   Data Reviewed:   I have personally reviewed following labs and imaging studies:  Labs: Basic Metabolic Panel:  Recent Labs Lab 09/24/15 1542 09/24/15 1928 09/25/15 0120 09/25/15 0855 09/26/15 0703  NA 138  --  140 139 140  K  3.0*  --  3.5 4.1 3.0*  CL 99*  --  104 104 101  CO2 30  --  30 28 27   GLUCOSE 138*  --  133* 160* 107*  BUN 21*  --  19 21* 20  CREATININE 1.70*  --  1.71* 1.74* 1.65*  CALCIUM 9.5  --  8.9 8.9 8.8*  MG  --  2.2  --   --   --   PHOS  --  2.0* 1.3*  --   --    GFR Estimated Creatinine Clearance: 20.8 mL/min (by C-G formula based on Cr of 1.65). Liver Function Tests:  Recent Labs Lab 09/24/15 1542 09/25/15 0120 09/25/15 0855 09/25/15 1900  AST  367*  --  429* 518*  ALT 145*  --  157* 187*  ALKPHOS 184*  --  185* 176*  BILITOT 1.3*  --  1.5* 1.3*  PROT 7.3  --  6.9 7.0  ALBUMIN 2.8* 2.4* 2.6* 2.7*    Recent Labs Lab 09/24/15 1722  AMMONIA 38*   CBC:  Recent Labs Lab 09/24/15 1542 09/25/15 0645  WBC 10.7* 10.8*  NEUTROABS 8.3*  --   HGB 15.2* 14.8  HCT 46.4* 45.1  MCV 94.1 94.7  PLT 299 299   Cardiac Enzymes:  Recent Labs Lab 09/24/15 1722 09/24/15 1928 09/25/15 0118 09/25/15 0645  CKTOTAL 7045*  --   --   --   TROPONINI  --  0.09* 0.09* 0.09*   Thyroid function studies:  Recent Labs  09/24/15 1737  TSH 1.875   Sepsis Labs:  Recent Labs Lab 09/24/15 1542 09/25/15 0645  WBC 10.7* 10.8*   Urine analysis:    Component Value Date/Time   COLORURINE YELLOW 09/24/2015 [place] 09/24/2015 1542   LABSPEC 1.014 09/24/2015 1542   PHURINE 7.0 09/24/2015 1542   GLUCOSEU 250* 09/24/2015 1542   HGBUR LARGE* 09/24/2015 1542   BILIRUBINUR NEGATIVE 09/24/2015 1542   KETONESUR NEGATIVE 09/24/2015 1542   PROTEINUR 100* 09/24/2015 1542   NITRITE NEGATIVE 09/24/2015 1542   LEUKOCYTESUR NEGATIVE 09/24/2015 1542   Microbiology Recent Results (from the past 240 hour(s))  MRSA PCR Screening     Status: None   Collection Time: 09/25/15  5:17 AM  Result Value Ref Range Status   MRSA by PCR NEGATIVE NEGATIVE Final    Comment:        The GeneXpert MRSA Assay (FDA approved for NASAL specimens only), is one component of a comprehensive MRSA colonization surveillance program. It is not intended to diagnose MRSA infection nor to guide or monitor treatment for MRSA infections.     Radiology: Dg Chest 2 View  09/24/2015  CLINICAL DATA:  Decreased appetite for 1 month. Chronic shortness of breath. EXAM: CHEST  2 VIEW COMPARISON:  08/30/2015 FINDINGS: Cardiomegaly. No overt edema. No confluent airspace opacities. Small right pleural effusion. No acute bony abnormality. IMPRESSION:  Cardiomegaly. Small right pleural effusion. Electronically Signed   By: Rolm Baptise M.D.   On: 09/24/2015 16:32   US Renal  09/25/2015  CLINICAL DATA:  82 year old female with acute kidney insufficiency. Subsequent encounter. EXAM: RENAL / URINARY TRACT ULTRASOUND COMPLETE COMPARISON:  08/30/2015 ultrasound. FINDINGS: Right Kidney: Length: 9 cm. Increased echogenicity consistent with changes of medical renal disease. No hydronephrosis or mass. Left Kidney: Length: 9 cm. Increased echogenicity consistent with changes medical renal disease. No hydronephrosis or mass. Bladder: Appears normal for degree of bladder distention. Right-sided pleural effusion IMPRESSION: Bilateral increased renal echogenicity consistent with  changes of medical renal disease. No hydronephrosis. Right-sided pleural effusion. Electronically Signed   By: Genia Del M.D.   On: 09/25/2015 07:51   US Abdomen Limited Ruq  09/25/2015  CLINICAL DATA:  82 year old female with acute kidney insufficiency, elevated liver enzymes, weakness and fatigue. Subsequent encounter. EXAM: US ABDOMEN LIMITED - RIGHT UPPER QUADRANT COMPARISON:  08/30/2015 ultrasound. FINDINGS: Gallbladder: Mobile gallstones measuring up to 8 mm. Phrygian cap incidentally noted. No gallbladder wall thickening or pericholecystic fluid. Common bile duct: Diameter: 4 mm proximally. Mid to distal aspect not visualized secondary to bowel gas. Liver: No focal lesion identified. Within normal limits in parenchymal echogenicity. Right-sided pleural effusion. IMPRESSION: Gallstones without ultrasound findings of gallbladder inflammation. No liver abnormality noted. Right-sided pleural effusion. Electronically Signed   By: Genia Del M.D.   On: 09/25/2015 07:01    Medications:   . apixaban  2.5 mg Oral BID  . aspirin EC  81 mg Oral Daily  . furosemide  40 mg Intravenous Q12H  . metoprolol tartrate  37.5 mg Oral BID  . pantoprazole  40 mg Oral Daily  . potassium chloride   10 mEq Oral BID  . vitamin B-12  1,000 mcg Oral q morning - 10a   Continuous Infusions:   Time spent: 35 minutes with > 50% of time discussing current diagnostic test results, clinical impression and plan of care.   LOS: 2 days   RAMA,CHRISTINA  [facility] Pager [phone]. If unable to reach me by pager, please call my cell phone at [phone].  *Please refer to [email or web address], password [email or web address] to get updated schedule on who will round on this patient, as hospitalists switch teams weekly. If 7PM-7AM, please contact night-coverage at [email or web address], password [email or web address] for any overnight needs.  09/26/2015, 8:44 AM

## 2015-09-26 NOTE — Progress Notes (Signed)
Physical Therapy Treatment Patient Details Name: Nancy [name] MRN: [id number] DOB: [birth year] Today's Date: 09/26/2015    History of Present Illness Patient is a 82 y/o female with hx of dyslipidemia, PAF, depression, CKD, mitral regurgitation and migraine presents with worsening fatigue. CXR- small right pleural effusion. Found to have Gallstones without ultrasound findings of gallbladder inflammation.    PT Comments    Pt progressing slowly but still not back to her baseline and continues to feel very weak.  Follow Up Recommendations  Home health PT;Supervision for mobility/OOB (at ALF)     Equipment Recommendations  None recommended by PT    Recommendations for Other Services OT consult     Precautions / Restrictions Precautions Precautions: Fall Restrictions Weight Bearing Restrictions: No    Mobility  Bed Mobility Overal bed mobility: Needs Assistance Bed Mobility: Supine to Sit     Supine to sit: Min assist;HOB elevated     General bed mobility comments: Assist to bring legs off bed and to elevate trunk into sitting.  Transfers Overall transfer level: Needs assistance Equipment used: Rolling walker (2 wheeled) Transfers: Sit to/from Stand Sit to Stand: Min assist         General transfer comment: Assist for balance and safety.  Ambulation/Gait Ambulation/Gait assistance: Min assist Ambulation Distance (Feet): 120 Feet Assistive device: Rolling walker (2 wheeled) Gait Pattern/deviations: Step-through pattern;Decreased step length - right;Decreased step length - left;Shuffle;Trunk flexed Gait velocity: decreased   General Gait Details: Assist for balance and support. Verbal cues to stay closer to walker and stand more erect. Pt with hip/knee flexion bilaterally throughout gait.   Stairs            Wheelchair Mobility    Modified Rankin (Stroke Patients Only)       Balance Overall balance assessment: Needs assistance Sitting-balance  support: No upper extremity supported Sitting balance-Leahy Scale: Good     Standing balance support: Bilateral upper extremity supported Standing balance-Leahy Scale: Poor Standing balance comment: support of walker                    Cognition Arousal/Alertness: Awake/alert Behavior During Therapy: WFL for tasks assessed/performed Overall Cognitive Status: Within Functional Limits for tasks assessed                      Exercises      General Comments        Pertinent Vitals/Pain Pain Assessment: Faces Faces Pain Scale: Hurts little more Pain Location: generalized Pain Descriptors / Indicators: Sore Pain Intervention(s): Limited activity within patient's tolerance;Monitored during session;Repositioned    Home Living                      Prior Function            PT Goals (current goals can now be found in the care plan section) Progress towards PT goals: Progressing toward goals    Frequency  Min 3X/week    PT Plan Current plan remains appropriate    Co-evaluation             End of Session Equipment Utilized During Treatment: Gait belt Activity Tolerance: Patient limited by fatigue Patient left: in chair;with call bell/phone within reach;with chair alarm set;with family/visitor present     Time: 8657-8469 PT Time Calculation (min) (ACUTE ONLY): 18 min  Charges:  $Gait Training: 8-22 mins  G Codes:      Freddrick Gladson 09/26/2015, 1:04 PM [facility] PT [phone]

## 2015-09-27 DIAGNOSIS — I420 Dilated cardiomyopathy: Secondary | ICD-10-CM

## 2015-09-27 DIAGNOSIS — I4891 Unspecified atrial fibrillation: Secondary | ICD-10-CM

## 2015-09-27 DIAGNOSIS — I5041 Acute combined systolic (congestive) and diastolic (congestive) heart failure: Secondary | ICD-10-CM

## 2015-09-27 DIAGNOSIS — N184 Chronic kidney disease, stage 4 (severe): Secondary | ICD-10-CM

## 2015-09-27 DIAGNOSIS — Z7901 Long term (current) use of anticoagulants: Secondary | ICD-10-CM

## 2015-09-27 DIAGNOSIS — R7989 Other specified abnormal findings of blood chemistry: Secondary | ICD-10-CM

## 2015-09-27 LAB — BASIC METABOLIC PANEL
ANION GAP: 9 (ref 5–15)
BUN: 26 mg/dL — ABNORMAL HIGH (ref 6–20)
CALCIUM: 8.8 mg/dL — AB (ref 8.9–10.3)
CO2: 27 mmol/L (ref 22–32)
Chloride: 101 mmol/L (ref 101–111)
Creatinine, Ser: 1.55 mg/dL — ABNORMAL HIGH (ref 0.44–1.00)
GFR, EST AFRICAN AMERICAN: 34 mL/min — AB (ref >60)
GFR, EST NON AFRICAN AMERICAN: 29 mL/min — AB (ref >60)
Glucose, Bld: 95 mg/dL (ref 65–99)
POTASSIUM: 3 mmol/L — AB (ref 3.5–5.1)
SODIUM: 137 mmol/L (ref 135–145)

## 2015-09-27 MED ORDER — DIGOXIN 0.25 MG/ML IJ SOLN
0.2500 mg | Freq: Once | INTRAMUSCULAR | Status: AC
  Administered 2015-09-27: 0.25 mg via INTRAVENOUS
  Filled 2015-09-27: qty 2

## 2015-09-27 MED ORDER — DIGOXIN 125 MCG PO TABS
0.1250 mg | ORAL_TABLET | Freq: Every day | ORAL | Status: DC
  Administered 2015-09-28 – 2015-09-30 (×3): 0.125 mg via ORAL
  Filled 2015-09-27 (×3): qty 1

## 2015-09-27 NOTE — Progress Notes (Signed)
OT Cancellation Note  Patient Details Name: Nancy [name] MRN: [id number] DOB: Dec 16, 1925   Cancelled Treatment:    Reason Eval/Treat Not Completed: Other (comment);Fatigue/lethargy limiting ability to participate.  Attempted skilled OT.  Pt. Asleep upon arrival.  Son present and allowed me to explain our goals for acute OT, then states "well i mean i dont really think this is a good time do you, she is sleeping".  Reviewed i was just stopping by to offer the tx. Session but it was okay if it is not a good time.  He states "i mean yeah she is sleeping, what are you trying to get her up to walk around the room or something".  Reviewed the difference between PT/OT, and answered all of his questions.  He continued to state it was not a good time.  Offered to have a therapist check back at another time, and he agreed this was the best option.  Offered if he or his mother had any needs, he reports they are fine.  Will check back at more appropriate time for pt.    Janice Coffin, COTA/L 09/27/2015, 10:50 AM

## 2015-09-27 NOTE — Consult Note (Signed)
CARDIOLOGY CONSULT NOTE   Patient ID: Nancy [name] MRN: [id number], DOB/AGE: 07/26/1925   Admit date: 09/24/2015 Date of Consult: 09/27/2015  Primary Physician: [name], MD Primary Cardiologist: Dr Rayann Heman  Reason for consult:  CHF  Problem List  Past Medical History  Diagnosis Date  . Migraine   . GERD (gastroesophageal reflux disease)   . Dyslipidemia   . History of scarlet fever   . PAF (paroxysmal atrial fibrillation) ([place])     a. On Coumadin  . Depression   . Melanoma ([place])   . Mitral regurgitation     a. Echo (11/15):  EF 50-55%, Gr 2 DD, MAC, prolapsing P2 segment of post MV leaflet with mod to possibly severe MR, mild LAE, normal RVF, PASP 35 mmHg;  b. Echo 5/16: Mild LVH, EF 55-60%, trivial AI, moderate MR (LVID, ES 32.3 mm), mild BAE, moderate TR, PASP 48 c. Echo 07/2015:EF 45-50%, moderate to severe MR   . NSTEMI (non-ST elevated myocardial infarction) ([place]) 07/2015    a. Troponin peak of 7.17. Cath w/ normal cors, thought to be 2ry to vasospasm  . CKD (chronic kidney disease), stage III     Past Surgical History  Procedure Laterality Date  . Tonsillectomy    . Appendectomy    . Cataract extraction      bilateral  . Cesarean section    . Cardiac catheterization N/A 07/21/2015    Procedure: Left Heart Cath and Coronary Angiography;  Surgeon: Lorretta Harp, MD;  Location: [place] [facility] [facility];  Service: Cardiovascular;  Laterality: N/A;  . Skin biopsy      Allergies  No Known Allergies  HPI   Nancy [name] is a 82 y.o. female with history of PAF, HLD, CRI (stage III), TIA's, with chronic combined systolic and diastolic CHF, the last echo from 09/26/15 decreased LVEF 35-40% from previous 45-50% on 07/22/2015. With moderate MR, h/o NSTEMI felt secondary to vasospasm, subsequent to that had a hospital visit secondary to diarrhea and more recently admitted 08/30/15 with CHD exacerbation, COPD/p,HTN and discharged the following day after tx with IV lasix, on  coumadin for anticoagulation at home. She is followed by Dr Rayann Heman, the last seen on 09/03/2015 when she complained of fatigue. Weight 131 lbs.   She was admitted on 09/24/2015 with worsening fatigue, she was found to have elevated LFTs that are still trending up, AST 518, ALT 187, total bilirubin 1.3. abdominal US and MRI were negative for an acute cholecystitis, no abnormalities of the liver were identified. Acute hepatitis A and B have been ruled out.  We were consulted for concern of CHF causing liver congestion and elevation in LFTs, her current weight is 121 lbs. CXR shows no signs of pulmonary edema. Crea 1.6, baseline 1.2 - 1.4.  Today the patients appears depressed, she states that she has no chest pain or SOB, but feels exhausted. No palpitations. She has been sleeping.   Inpatient Medications  . apixaban  2.5 mg Oral BID  . aspirin EC  81 mg Oral Daily  . furosemide  40 mg Intravenous Q12H  . lactulose  10 g Oral BID  . metoprolol tartrate  37.5 mg Oral BID  . pantoprazole  40 mg Oral Daily  . potassium chloride  20 mEq Oral BID  . potassium chloride  40 mEq Oral Once  . vitamin B-12  1,000 mcg Oral q morning - 10a   Family History Family History  Problem Relation Age of Onset  . Colon  polyps    . Heart attack Neg Hx   . Stroke Neg Hx   . Hypertension Neg Hx     Social History Social History   Social History  . Marital Status: Married    Spouse Name: N/A  . Number of Children: N/A  . Years of Education: N/A   Occupational History  . Not on file.   Social History Main Topics  . Smoking status: Former Smoker -- 6 years    Types: Cigarettes    Quit date: 09/09/1967  . Smokeless tobacco: Not on file     Comment: pt reports she smoked socially  . Alcohol Use: 4.2 oz/week    7 Glasses of wine per week  . Drug Use: No  . Sexual Activity: No   Other Topics Concern  . Not on file   Social History Narrative    Review of Systems  General:  No chills, fever,  night sweats or weight changes.  Cardiovascular:  No chest pain, dyspnea on exertion, edema, orthopnea, palpitations, paroxysmal nocturnal dyspnea. Dermatological: No rash, lesions/masses Respiratory: No cough, dyspnea Urologic: No hematuria, dysuria Abdominal:   No nausea, vomiting, diarrhea, bright red blood per rectum, melena, or hematemesis Neurologic:  No visual changes, wkns, changes in mental status. All other systems reviewed and are otherwise negative except as noted above.  Physical Exam  Blood pressure 100/78, pulse 116, temperature 98.1 F (36.7 C), temperature source Oral, resp. rate 20, height 5' 6.5" (1.689 m), weight 117 lb 4.6 oz (53.2 kg), SpO2 94 %.  General: Pleasant, somnolent Neuro: Alert and oriented X 3. Moves all extremities spontaneously. HEENT: Normal  Neck: Supple without bruits or JVD. Lungs:  Resp regular and unlabored, CTA. Heart: iRRR, tachycardic, 4/6 systolic murmur. Abdomen: Soft, non-tender, non-distended, BS + x 4.  Extremities: No clubbing, cyanosis or edema. DP/PT/Radials 2+ and equal bilaterally.  Labs  Recent Labs  09/24/15 1722 09/24/15 1928 09/25/15 0118 09/25/15 0645  CKTOTAL 7045*  --   --   --   TROPONINI  --  0.09* 0.09* 0.09*   Lab Results  Component Value Date   WBC 10.8* 09/25/2015   HGB 14.8 09/25/2015   HCT 45.1 09/25/2015   MCV 94.7 09/25/2015   PLT 299 09/25/2015    Recent Labs Lab 09/25/15 1900  09/27/15 0543  NA  --   < > 137  K  --   < > 3.0*  CL  --   < > 101  CO2  --   < > 27  BUN  --   < > 26*  CREATININE  --   < > 1.55*  CALCIUM  --   < > 8.8*  PROT 7.0  --   --   BILITOT 1.3*  --   --   ALKPHOS 176*  --   --   ALT 187*  --   --   AST 518*  --   --   GLUCOSE  --   < > 95  < > = values in this interval not displayed. No results found for: CHOL, HDL, LDLCALC, TRIG No results found for: DDIMER Invalid input(s): POCBNP  Radiology/Studies  Dg Chest 2 View  09/24/2015  CLINICAL DATA:   IMPRESSION:  Cardiomegaly. Small right pleural effusion. Electronically Signed    US Renal  09/25/2015  CLINICAL DATA:   IMPRESSION: Bilateral increased renal echogenicity consistent with changes of medical renal disease. No hydronephrosis. Right-sided pleural effusion. Electronically Signed   By: Remo Lipps  Jeannine Kitten M.D.   On: 09/25/2015 07:51   Mr 3d Recon At Scanner  09/26/2015  CLINICAL DATA:  82 year old female inpatient with cholelithiasis and elevated liver function tests.  IMPRESSION: 1. Cholelithiasis. No unenhanced MRI findings of acute cholecystitis. 2. No biliary ductal dilatation.  No choledocholithiasis. 3. Normal liver. 4. Small layering right pleural effusion. Electronically Signed   By: Ilona Sorrel M.D.   On: 09/26/2015 21:05   US Abdomen Limited Ruq  09/25/2015  CLINICAL DATA:  82 year old female with acute kidney insufficiency, elevated liver enzymes, weakness and fatigue. Subsequent encounter.  IMPRESSION: Gallstones without ultrasound findings of gallbladder inflammation. No liver abnormality noted. Right-sided pleural effusion. Electronically Signed   By: Genia Del M.D.   On: 09/25/2015 07:01   Echocardiogram 09/26/2015 Left ventricle: The cavity size was normal. Systolic function was  moderately reduced. The estimated ejection fraction was in the  range of 35% to 40%. Diffuse hypokinesis. The study was not  technically sufficient to allow evaluation of LV diastolic  dysfunction due to atrial fibrillation. - Aortic valve: Trileaflet; normal thickness leaflets.  Transvalvular velocity was within the normal range. There was no  stenosis. There was no regurgitation. - Mitral valve: The mitral valve is myxomatous with moderate  bi-leaflet prolapse and associated moderate mitral regurgitation.  There was moderate regurgitation. - Left atrium: The atrium was mildly dilated. - Right ventricle: The cavity size was moderately dilated. Wall  thickness was normal. Systolic function was  mildly to moderately  reduced. - Right atrium: The atrium was moderately dilated. - Tricuspid valve: There was moderate regurgitation. - Pulmonic valve: There was moderate regurgitation. - Pulmonary arteries: Systolic pressure was mildly increased. PA  peak pressure: 38 mm Hg (S).  ECG:   Telemetry: a-fib with RVR 100-114 BPM    ASSESSMENT AND PLAN  1. PAFib  CHA2DS2Vasc is at least 8 on coumadin  08/31/15 H/H 13/43, Creat 1.73     On Eliquis 2.5 mg po BID     She has been in a-fib for at least 3 months now, currently in RVR, we are unable to add BB or CCB as she is hypotensive, not amiodarone with significant liver impairment, I will start Digoxin 0.25 mg iv x1, then 0.125 mg PO daily  2. Chronic systolic CHF - she appears euvolemic to dry and is tachycardic, she is bellow her baseline weight of 130 lbs, currently 121 lbs, I would hold lasix iv, reassess in the am.  3. NICM - LVEF now 35-40%, previously 45-50%, this is possibly tachycardia mediated, I will start digoxin as described above   4. NSTEMI 07/21/15  LHC as above felt embolic vs spasm  No angina, she has not felt the need to use s/l NTG  5. VHD, mod-severe MR  ? Possibly contributing to her DOE, though had at least mod VHD last year as well  4. P.HTN, COPD  Recommend she have f/u with Dr. Melvyn Novas  6. Fatigue, decreased exertional capacity  Suspect this may be multifactorial   However unexplained LFTs elevation ? Viral illness?  Signed, Ena Dawley, MD, [facility] 09/27/2015, 12:56 PM

## 2015-09-27 NOTE — Progress Notes (Signed)
Son says not to give his mum any of the scheduled medications. Pt aware and in agreement with son. During the shift, pt complained of pain. MD was paged. Tramadol was ordered. Upon reaching the pt's room to administer the pain med, she was asleep. Pt was awoken and asked if she wanted the pain medicine. Pt said she does not think she needed the medication right then. Pt went right back to sleep. Pt is doing well. Will continue to monitor.

## 2015-09-27 NOTE — Progress Notes (Signed)
Progress Note    Kaisy Jakab  [id number] DOB: 09/22/1925  DOA: 09/24/2015 PCP: [name], MD    Brief Narrative:   Nancy [name] is an 82 y.o. female with a PMH of recent non-STEMI, pulmonary hypertension, valvular heart disease, PAF and stage III chronic kidney disease who was admitted 09/24/15 with a chief complaint of worsening fatigue. Upon initial evaluation in the ED, patient was found to have elevated LFTs. A right upper quadrant ultrasound showed gallstones without ultrasound findings of gallbladder inflammation. No abnormalities of the liver were noted.  Assessment/Plan:   Principal Problem:   Elevated LFTs/cholelithiasis Ultrasound negative for gallbladder thickening.  No RUQ tenderness.  MRCP showed cholelithiasis but no findings of acute cholecystitis, no biliary ductal dilatation, no choledocholithiasis and a normal appearing liver.   Active Problems:   Acute on chronic systolic and diastolic CHF / demand ischemia EF 45% and grade III diastolic dysfunction 0000000.  CXR on admission showed cardiomegaly and a small right pleural effusion.  BNP 1347.  Strict I/O, daily weights. Troponins mildly elevated with flat trend.  Family refusing medications at this time. Repeat echocardiogram showed an EF of 35-40 percent, diffuse hypokinesis, and LV diastolic dysfunction due to atrial fibrillation. Will ask for cardiologist to consult given reduction in EF, elevated troponin, and family wishes to avoid neccessary medications.    PAF Continue Eliquis and metoprolol.    SOB (shortness of breath) No current complaints of dyspnea.    Fatigue Suspect from decompensated CHF, MRCP negative.  PT/OT evaluations.  Family Communication/Anticipated D/C date and plan/Code Status   DVT prophylaxis:  Eliquis ordered. Code Status: Full Code.  Family Communication: Laverna Peace, son, updated today. [id number]. Disposition Plan: From [place].  Laverna Peace is thinking of taking her  home to live with him.   Medical Consultants:    Cardiology   Procedures:   2 D Echo MRCP  Anti-Infectives:   Anti-infectives    None      Subjective:   Chiyo Auth continues to feel fatigued but continues to deny dyspnea, chest pain, or abdominal pain. Able to ambulate with walker, fairly steady on feet.  Objective:    Filed Vitals:   09/26/15 0517 09/26/15 1408 09/26/15 2150 09/27/15 0628  BP: 131/62 120/77 99/67 100/78  Pulse: 84 72 104 116  Temp: 98 F (36.7 C) 97.5 F (36.4 C) 98.2 F (36.8 C) 98.1 F (36.7 C)  TempSrc: Oral     Resp: 16 19 18 20   Height:      Weight: 54.9 kg (121 lb 0.5 oz)   53.2 kg (117 lb 4.6 oz)  SpO2: 97% 93% 93% 94%    Intake/Output Summary (Last 24 hours) at 09/27/15 1142 Last data filed at 09/26/15 2050  Gross per 24 hour  Intake    120 ml  Output    300 ml  Net   -180 ml   Filed Weights   09/24/15 1832 09/26/15 0517 09/27/15 0628  Weight: 55.7 kg (122 lb 12.7 oz) 54.9 kg (121 lb 0.5 oz) 53.2 kg (117 lb 4.6 oz)    Exam: General exam: Appears calm and comfortable.  Respiratory system: Diminished breath sounds, poor effort. Cardiovascular system: HSIR. Hyperdynamic precordium. No JVD,  rubs, gallops or clicks. No murmurs. Gastrointestinal system: Abdomen is nondistended, soft and nontender. No organomegaly or masses felt. Normal bowel sounds heard. Central nervous system: Sleepy, oriented x 2. No focal neurological deficits. Extremities: No clubbing, edema, or cyanosis. Skin: No rashes, lesions  or ulcers Psychiatry: Judgement and insight appear normal. Mood & affect flat.   Data Reviewed:   I have personally reviewed following labs and imaging studies:  Labs: Basic Metabolic Panel:  Recent Labs Lab 09/24/15 1542 09/24/15 1928 09/25/15 0120 09/25/15 0855 09/26/15 0703 09/27/15 0543  NA 138  --  140 139 140 137  K 3.0*  --  3.5 4.1 3.0* 3.0*  CL 99*  --  104 104 101 101  CO2 30  --  30 28 27 27   GLUCOSE  138*  --  133* 160* 107* 95  BUN 21*  --  19 21* 20 26*  CREATININE 1.70*  --  1.71* 1.74* 1.65* 1.55*  CALCIUM 9.5  --  8.9 8.9 8.8* 8.8*  MG  --  2.2  --   --   --   --   PHOS  --  2.0* 1.3*  --   --   --    GFR Estimated Creatinine Clearance: 21.5 mL/min (by C-G formula based on Cr of 1.55). Liver Function Tests:  Recent Labs Lab 09/24/15 1542 09/25/15 0120 09/25/15 0855 09/25/15 1900  AST 367*  --  429* 518*  ALT 145*  --  157* 187*  ALKPHOS 184*  --  185* 176*  BILITOT 1.3*  --  1.5* 1.3*  PROT 7.3  --  6.9 7.0  ALBUMIN 2.8* 2.4* 2.6* 2.7*    Recent Labs Lab 09/24/15 1722  AMMONIA 38*   CBC:  Recent Labs Lab 09/24/15 1542 09/25/15 0645  WBC 10.7* 10.8*  NEUTROABS 8.3*  --   HGB 15.2* 14.8  HCT 46.4* 45.1  MCV 94.1 94.7  PLT 299 299   Cardiac Enzymes:  Recent Labs Lab 09/24/15 1722 09/24/15 1928 09/25/15 0118 09/25/15 0645  CKTOTAL 7045*  --   --   --   TROPONINI  --  0.09* 0.09* 0.09*   Thyroid function studies:  Recent Labs  09/24/15 1737  TSH 1.875   Sepsis Labs:  Recent Labs Lab 09/24/15 1542 09/25/15 0645  WBC 10.7* 10.8*   Urine analysis:    Component Value Date/Time   COLORURINE YELLOW 09/24/2015 [place] 09/24/2015 1542   LABSPEC 1.014 09/24/2015 1542   PHURINE 7.0 09/24/2015 1542   GLUCOSEU 250* 09/24/2015 1542   HGBUR LARGE* 09/24/2015 1542   BILIRUBINUR NEGATIVE 09/24/2015 1542   KETONESUR NEGATIVE 09/24/2015 1542   PROTEINUR 100* 09/24/2015 1542   NITRITE NEGATIVE 09/24/2015 1542   LEUKOCYTESUR NEGATIVE 09/24/2015 1542   Microbiology Recent Results (from the past 240 hour(s))  MRSA PCR Screening     Status: None   Collection Time: 09/25/15  5:17 AM  Result Value Ref Range Status   MRSA by PCR NEGATIVE NEGATIVE Final    Comment:        The GeneXpert MRSA Assay (FDA approved for NASAL specimens only), is one component of a comprehensive MRSA colonization surveillance program. It is  not intended to diagnose MRSA infection nor to guide or monitor treatment for MRSA infections.     Radiology: Mr Abdomen Mrcp Wo Cm  09/26/2015  CLINICAL DATA:  82 year old female inpatient with cholelithiasis and elevated liver function tests. EXAM: MRI ABDOMEN WITHOUT CONTRAST  (INCLUDING MRCP) TECHNIQUE: Multiplanar multisequence MR imaging of the abdomen was performed. Heavily T2-weighted images of the biliary and pancreatic ducts were obtained, and three-dimensional MRCP images were rendered by post processing. COMPARISON:  09/25/2015 right upper quadrant abdominal sonogram. FINDINGS: Lower chest: Small layering right pleural  effusion. Hepatobiliary: Normal liver size and configuration. No hepatic steatosis. No liver mass. Subcentimeter gallstone is seen in the gallbladder, with no gallbladder wall thickening or pericholecystic fluid. Normal variant Phrygian cap. No biliary ductal dilatation. Common bile duct diameter 4 mm. No choledocholithiasis. No evidence of a biliary stricture. Pancreas: No pancreatic mass or duct dilation.  No pancreas divisum. Spleen: Normal size. No mass. Adrenals/Urinary Tract: Normal adrenals. No hydronephrosis. Subcentimeter simple appearing renal cyst in the posterior interpolar left kidney. No additional renal lesions. Stomach/Bowel: Grossly normal stomach. Visualized small and large bowel is normal caliber, with no bowel wall thickening. Vascular/Lymphatic: Normal caliber abdominal aorta. No pathologically enlarged lymph nodes in the abdomen. Other: No abdominal ascites or focal fluid collection. Musculoskeletal: No aggressive appearing focal osseous lesions. IMPRESSION: 1. Cholelithiasis. No unenhanced MRI findings of acute cholecystitis. 2. No biliary ductal dilatation.  No choledocholithiasis. 3. Normal liver. 4. Small layering right pleural effusion. Electronically Signed   By: Ilona Sorrel M.D.   On: 09/26/2015 21:05   Mr 3d Recon At Scanner  09/26/2015  CLINICAL  DATA:  82 year old female inpatient with cholelithiasis and elevated liver function tests. EXAM: MRI ABDOMEN WITHOUT CONTRAST  (INCLUDING MRCP) TECHNIQUE: Multiplanar multisequence MR imaging of the abdomen was performed. Heavily T2-weighted images of the biliary and pancreatic ducts were obtained, and three-dimensional MRCP images were rendered by post processing. COMPARISON:  09/25/2015 right upper quadrant abdominal sonogram. FINDINGS: Lower chest: Small layering right pleural effusion. Hepatobiliary: Normal liver size and configuration. No hepatic steatosis. No liver mass. Subcentimeter gallstone is seen in the gallbladder, with no gallbladder wall thickening or pericholecystic fluid. Normal variant Phrygian cap. No biliary ductal dilatation. Common bile duct diameter 4 mm. No choledocholithiasis. No evidence of a biliary stricture. Pancreas: No pancreatic mass or duct dilation.  No pancreas divisum. Spleen: Normal size. No mass. Adrenals/Urinary Tract: Normal adrenals. No hydronephrosis. Subcentimeter simple appearing renal cyst in the posterior interpolar left kidney. No additional renal lesions. Stomach/Bowel: Grossly normal stomach. Visualized small and large bowel is normal caliber, with no bowel wall thickening. Vascular/Lymphatic: Normal caliber abdominal aorta. No pathologically enlarged lymph nodes in the abdomen. Other: No abdominal ascites or focal fluid collection. Musculoskeletal: No aggressive appearing focal osseous lesions. IMPRESSION: 1. Cholelithiasis. No unenhanced MRI findings of acute cholecystitis. 2. No biliary ductal dilatation.  No choledocholithiasis. 3. Normal liver. 4. Small layering right pleural effusion. Electronically Signed   By: Ilona Sorrel M.D.   On: 09/26/2015 21:05    Medications:   . apixaban  2.5 mg Oral BID  . aspirin EC  81 mg Oral Daily  . furosemide  40 mg Intravenous Q12H  . lactulose  10 g Oral BID  . metoprolol tartrate  37.5 mg Oral BID  . pantoprazole   40 mg Oral Daily  . potassium chloride  20 mEq Oral BID  . potassium chloride  40 mEq Oral Once  . vitamin B-12  1,000 mcg Oral q morning - 10a   Continuous Infusions:   Time spent: 35 minutes with > 50% of time discussing current diagnostic test results, clinical impression and plan of care.   LOS: 3 days   [facility] Pager [phone]. If unable to reach me by pager, please call my cell phone at [phone].  *Please refer to [email or web address], password [email or web address] to get updated schedule on who will round on this patient, as hospitalists switch teams weekly. If 7PM-7AM, please contact night-coverage at [email or web address], password [email or web address] for any overnight needs.  09/27/2015,  11:42 AM

## 2015-09-28 DIAGNOSIS — R5382 Chronic fatigue, unspecified: Secondary | ICD-10-CM | POA: Insufficient documentation

## 2015-09-28 DIAGNOSIS — N179 Acute kidney failure, unspecified: Secondary | ICD-10-CM

## 2015-09-28 DIAGNOSIS — I480 Paroxysmal atrial fibrillation: Secondary | ICD-10-CM

## 2015-09-28 LAB — COMPREHENSIVE METABOLIC PANEL
ALBUMIN: 2.3 g/dL — AB (ref 3.5–5.0)
ALK PHOS: 169 U/L — AB (ref 38–126)
ALT: 415 U/L — AB (ref 14–54)
ANION GAP: 9 (ref 5–15)
AST: 855 U/L — ABNORMAL HIGH (ref 15–41)
BILIRUBIN TOTAL: 1.5 mg/dL — AB (ref 0.3–1.2)
BUN: 28 mg/dL — AB (ref 6–20)
CALCIUM: 9.1 mg/dL (ref 8.9–10.3)
CO2: 28 mmol/L (ref 22–32)
CREATININE: 1.53 mg/dL — AB (ref 0.44–1.00)
Chloride: 101 mmol/L (ref 101–111)
GFR calc Af Amer: 34 mL/min — ABNORMAL LOW (ref >60)
GFR calc non Af Amer: 29 mL/min — ABNORMAL LOW (ref >60)
GLUCOSE: 118 mg/dL — AB (ref 65–99)
Potassium: 4 mmol/L (ref 3.5–5.1)
SODIUM: 138 mmol/L (ref 135–145)
TOTAL PROTEIN: 6.4 g/dL — AB (ref 6.5–8.1)

## 2015-09-28 LAB — CK: Total CK: 13739 U/L — ABNORMAL HIGH (ref 38–234)

## 2015-09-28 LAB — HEPATITIS PANEL, ACUTE
Hep A IgM: NEGATIVE
Hep B C IgM: NEGATIVE
Hepatitis B Surface Ag: NEGATIVE

## 2015-09-28 LAB — BRAIN NATRIURETIC PEPTIDE: B Natriuretic Peptide: 787.3 pg/mL — ABNORMAL HIGH (ref 0.0–100.0)

## 2015-09-28 MED ORDER — METOPROLOL SUCCINATE ER 25 MG PO TB24
37.5000 mg | ORAL_TABLET | Freq: Two times a day (BID) | ORAL | Status: DC
  Administered 2015-09-28 – 2015-09-30 (×3): 37.5 mg via ORAL
  Filled 2015-09-28 (×5): qty 2

## 2015-09-28 NOTE — Consult Note (Signed)
Consultation  Referring Provider:  [facility] hospitalist- Rama Primary Care Physician:  [name], MD Primary Gastroenterologist:  None- former Dr Olevia Perches - remote  Reason for Consultation:   Elevated LFT's  HPI: Nancy [name] is a 82 y.o. female  with multiple medical problems who was admitted on 09/24/2015 with complaints of severe progressive fatigue and weakness. She denies any pain but says she got to the point where she was unable to even lift her legs because she is felt so weak and uncomfortable. Fatigue started several weeks ago and gradually progressed. She has not had any associated fever or chills, no abdominal pain nausea or vomiting appetite is been fair. She denied chest pain or shortness of breath on admission. Significant history includes admission in April 2017 for non-STEMI, I believe she was also started on Crestor at that time. She had a readmission 08/30/2015 with CHF exacerbation. Other problems include paroxysmal atrial fibrillation for which she was on Coumadin and has  transitioned to eliquis, chronic kidney disease stage III, prior TIA, COPD and hypertension. On admission noted that she had a transaminitis with AST of 367 and ALT of 145, BNP was 1347 and CK elevated at 7045. She had 2-D echo done which showed a decrease in her EF to 3540%, chest x-ray shows a right pleural effusion Upper abdominal ultrasound on 62/2 showed a right pleural effusion and gallstones there was no common bile duct dilation and liver appeared normal MRI abdomen and MRCP confirms cholelithiasis, common bile duct of 4 mm and liver appears normal Most recent labs done this morning show total bili of 1.5 alkaline phosphatase 169 and AST of 855 and ALT of 4:15  hepatitis serologies negative CK and BNP have not been repeated  EtOH use generally one glass of white wine per day Family history negative for liver disease as far she is aware.   Past Medical History  Diagnosis Date  . Migraine   .  GERD (gastroesophageal reflux disease)   . Dyslipidemia   . History of scarlet fever   . PAF (paroxysmal atrial fibrillation) ([place])     a. On Coumadin  . Depression   . Melanoma ([place])   . Mitral regurgitation     a. Echo (11/15):  EF 50-55%, Gr 2 DD, MAC, prolapsing P2 segment of post MV leaflet with mod to possibly severe MR, mild LAE, normal RVF, PASP 35 mmHg;  b. Echo 5/16: Mild LVH, EF 55-60%, trivial AI, moderate MR (LVID, ES 32.3 mm), mild BAE, moderate TR, PASP 48 c. Echo 07/2015:EF 45-50%, moderate to severe MR   . NSTEMI (non-ST elevated myocardial infarction) ([place]) 07/2015    a. Troponin peak of 7.17. Cath w/ normal cors, thought to be 2ry to vasospasm  . CKD (chronic kidney disease), stage III     Past Surgical History  Procedure Laterality Date  . Tonsillectomy    . Appendectomy    . Cataract extraction      bilateral  . Cesarean section    . Cardiac catheterization N/A 07/21/2015    Procedure: Left Heart Cath and Coronary Angiography;  Surgeon: Lorretta Harp, MD;  Location: [facility];  Service: Cardiovascular;  Laterality: N/A;  . Skin biopsy      Prior to Admission medications   Medication Sig Start Date End Date Taking? Authorizing Provider  apixaban (ELIQUIS) 2.5 MG TABS tablet Take 1 tablet (2.5 mg total) by mouth 2 (two) times daily. 09/04/15  Yes Renee Dyane Dustman, PA-C  calcium-vitamin D (OSCAL WITH D 500-200) 500-200 MG-UNIT per tablet Take 1 tablet by mouth daily.     Yes Historical Provider, MD  Coenzyme Q10 (CO Q 10 PO) Take 100 mg by mouth daily.    Yes Historical Provider, MD  ergocalciferol (VITAMIN D2) 50000 units capsule Take 50,000 Units by mouth once a week.   Yes Historical Provider, MD  furosemide (LASIX) 20 MG tablet Take 1 tablet (20 mg total) by mouth daily. 08/31/15  Yes Reyne Dumas, MD  metoprolol tartrate 37.5 MG TABS Take 37.5 mg by mouth 2 (two) times daily. 08/31/15  Yes Reyne Dumas, MD  Multiple Vitamins-Minerals (CENTRUM SILVER PO)  Take 1 tablet by mouth daily.   Yes Historical Provider, MD  nitroGLYCERIN (NITROSTAT) 0.4 MG SL tablet Place 1 tablet (0.4 mg total) under the tongue every 5 (five) minutes x 3 doses as needed for chest pain. 07/22/15  Yes Erma Heritage, PA  pantoprazole (PROTONIX) 40 MG tablet Take 1 tablet (40 mg total) by mouth daily. 08/31/15  Yes Reyne Dumas, MD  rosuvastatin (CRESTOR) 40 MG tablet TAKE 1 TABLET (40 MG TOTAL) BY MOUTH DAILY AT 6 PM. 08/11/15  Yes Historical Provider, MD  vitamin B-12 (CYANOCOBALAMIN) 1000 MCG tablet Take 1,000 mcg by mouth every morning.   Yes Historical Provider, MD    Current Facility-Administered Medications  Medication Dose Route Frequency Provider Last Rate Last Dose  . apixaban (ELIQUIS) tablet 2.5 mg  2.5 mg Oral BID Bonnell Public, MD   2.5 mg at 09/27/15 2223  . aspirin EC tablet 81 mg  81 mg Oral Daily Venetia Maxon Rama, MD   81 mg at 09/27/15 1022  . digoxin (LANOXIN) tablet 0.125 mg  0.125 mg Oral Daily Dorothy Spark, MD   0.125 mg at 09/28/15 1003  . lactulose (CHRONULAC) 10 GM/15ML solution 10 g  10 g Oral BID Venetia Maxon Rama, MD   10 g at 09/27/15 1022  . metoprolol succinate (TOPROL-XL) 24 hr tablet 37.5 mg  37.5 mg Oral BID Arnoldo Lenis, MD   37.5 mg at 09/28/15 1003  . nitroGLYCERIN (NITROSTAT) SL tablet 0.4 mg  0.4 mg Sublingual Q5 Min x 3 PRN Bonnell Public, MD      . pantoprazole (PROTONIX) EC tablet 40 mg  40 mg Oral Daily Bonnell Public, MD   Stopped at 09/26/15 [phone]  . potassium chloride SA (K-DUR,KLOR-CON) CR tablet 20 mEq  20 mEq Oral BID Venetia Maxon Rama, MD   20 mEq at 09/27/15 2223  . potassium chloride SA (K-DUR,KLOR-CON) CR tablet 40 mEq  40 mEq Oral Once Tonye Royalty, MD   Stopped at 09/26/15 [phone]  . traMADol (ULTRAM) tablet 50 mg  50 mg Oral Q12H PRN Dianne Dun, NP      . vitamin B-12 (CYANOCOBALAMIN) tablet 1,000 mcg  1,000 mcg Oral q morning - 10a Bonnell Public, MD   Stopped at 09/26/15 [phone]     Allergies as of 09/24/2015  . (No Known Allergies)    Family History  Problem Relation Age of Onset  . Colon polyps    . Heart attack Neg Hx   . Stroke Neg Hx   . Hypertension Neg Hx     Social History   Social History  . Marital Status: Married    Spouse Name: N/A  . Number of Children: N/A  . Years of Education: N/A   Occupational History  . Not on file.  Social History Main Topics  . Smoking status: Former Smoker -- 6 years    Types: Cigarettes    Quit date: 09/09/1967  . Smokeless tobacco: Not on file     Comment: pt reports she smoked socially  . Alcohol Use: 4.2 oz/week    7 Glasses of wine per week  . Drug Use: No  . Sexual Activity: No   Other Topics Concern  . Not on file   Social History Narrative    Review of Systems: Pertinent positive and negative review of systems were noted in the above HPI section.  All other review of systems was otherwise negative.  Physical Exam: Vital signs in last 24 hours: Temp:  [97.8 F (36.6 C)-97.9 F (36.6 C)] 97.9 F (36.6 C) (06/25 0555) Pulse Rate:  [94-123] 94 (06/25 0556) Resp:  [16-18] 16 (06/25 0555) BP: (103-126)/(62-84) 103/81 mmHg (06/25 0555) SpO2:  [97 %-99 %] 99 % (06/25 0556) Weight:  [119 lb 0.8 oz (54 kg)] 119 lb 0.8 oz (54 kg) (06/25 0555) Last BM Date: 09/25/15 General:   Alert,  Well-developed, well-nourished,elderly WF pleasant and cooperative in NAD Head:  Normocephalic and atraumatic. Eyes:  Sclera clear, no icterus.   Conjunctiva pink. Ears:  Normal auditory acuity. Nose:  No deformity, discharge,  or lesions. Mouth:  No deformity or lesions.   Neck:  Supple; no masses or thyromegaly. Lungs:  Clear , decreased right base.   No wheezes, crackles, or rhonchi. Heart:  Regular rate and rhythm; systolic murmur Abdomen:  Soft,nontender, BS active,nonpalp mass or hsm.   Rectal:  Deferred  Msk:  Symmetrical without gross deformities. . Pulses:  Normal pulses noted. Extremities:   Without clubbing or edema. Neurologic:  Alert and  oriented x4;  grossly normal neurologically. Skin:  Intact without significant lesions or rashes.. Psych:  Alert and cooperative. Normal mood and affect.  Intake/Output from previous day: 06/24 0701 - 06/25 0700 In: 200 [P.O.:200] Out: -  Intake/Output this shift:    Lab Results: No results for input(s): WBC, HGB, HCT, PLT in the last 72 hours. BMET  Recent Labs  09/26/15 0703 09/27/15 0543 09/28/15 0507  NA 140 137 138  K 3.0* 3.0* 4.0  CL 101 101 101  CO2 27 27 28   GLUCOSE 107* 95 118*  BUN 20 26* 28*  CREATININE 1.65* 1.55* 1.53*  CALCIUM 8.8* 8.8* 9.1   LFT  Recent Labs  09/25/15 1900 09/28/15 0507  PROT 7.0 6.4*  ALBUMIN 2.7* 2.3*  AST 518* 855*  ALT 187* 415*  ALKPHOS 176* 169*  BILITOT 1.3* 1.5*  BILIDIR 0.7*  --   IBILI 0.6  --    PT/INR No results for input(s): LABPROT, INR in the last 72 hours. Hepatitis Panel  Recent Labs  09/27/15 1216  HEPBSAG Negative  HCVAB <0.1  HEPAIGM Negative  HEPBIGM Negative      IMPRESSION:   #35 82 year old white female admitted with progressively severe fatigue and weakness over several weeks. On admit had a transaminitis, markedly elevated BNP and markedly elevated CKs. Workup thus far negative for biliary pathology. Patient has absolutely no GI symptoms Acute hepatitis serologies negative Cardiology does not feel that she has had exacerbation of CHF this admission, though EF has decreased We will rule out other underlying liver disease i.e. autoimmune. However with markedly elevated CKs wonder if this is drug-induced liver injury possibly secondary to Crestor which also can cause a rhabdomyolysis.  #2 atrial fibrillation #3 chronic anticoagulation-currently on eliquis #4  recent non-STE MI April 2017 and then readmission May 2017 with CHF exacerbation #5 history of TIA #6 chronic kidney disease stage III  Plan; stopped Crestor-which I believe has been  on hold Will check autoimmune markers ,CMV and EBV viral IGG and IGM Repeat BNP and CKs Follow serial LFTs  Thank-you for the consult we will follow with you.      Amy Esterwood  09/28/2015, 10:46 AM

## 2015-09-28 NOTE — Progress Notes (Signed)
Subjective:    Generalized fatigue. No SOB.   Objective:   Temp:  [97.8 F (36.6 C)-97.9 F (36.6 C)] 97.9 F (36.6 C) (06/25 0555) Pulse Rate:  [94-123] 94 (06/25 0556) Resp:  [16-18] 16 (06/25 0555) BP: (103-126)/(62-84) 103/81 mmHg (06/25 0555) SpO2:  [97 %-99 %] 99 % (06/25 0556) Weight:  [119 lb 0.8 oz (54 kg)] 119 lb 0.8 oz (54 kg) (06/25 0555) Last BM Date: 09/25/15  Filed Weights   09/26/15 0517 09/27/15 0628 09/28/15 0555  Weight: 121 lb 0.5 oz (54.9 kg) 117 lb 4.6 oz (53.2 kg) 119 lb 0.8 oz (54 kg)    Intake/Output Summary (Last 24 hours) at 09/28/15 0851 Last data filed at 09/28/15 0645  Gross per 24 hour  Intake    200 ml  Output      0 ml  Net    200 ml    Telemetry: afib rates low 100s  Exam:  General: NAD  HEENT: sclera clear, throat clear  Resp: CTAB  Cardiac: irreg, rate low 100s, no m/r/g, no jvd  GI: abdomen soft, NT, ND  MSK: no LE edema  Neuro: no focal deficits  Psych: appropriate affect.   Lab Results:  Basic Metabolic Panel:  Recent Labs Lab 09/24/15 1928  09/26/15 0703 09/27/15 0543 09/28/15 0507  NA  --   < > 140 137 138  K  --   < > 3.0* 3.0* 4.0  CL  --   < > 101 101 101  CO2  --   < > 27 27 28   GLUCOSE  --   < > 107* 95 118*  BUN  --   < > 20 26* 28*  CREATININE  --   < > 1.65* 1.55* 1.53*  CALCIUM  --   < > 8.8* 8.8* 9.1  MG 2.2  --   --   --   --   < > = values in this interval not displayed.  Liver Function Tests:  Recent Labs Lab 09/25/15 0855 09/25/15 1900 09/28/15 0507  AST 429* 518* 855*  ALT 157* 187* 415*  ALKPHOS 185* 176* 169*  BILITOT 1.5* 1.3* 1.5*  PROT 6.9 7.0 6.4*  ALBUMIN 2.6* 2.7* 2.3*    CBC:  Recent Labs Lab 09/24/15 1542 09/25/15 0645  WBC 10.7* 10.8*  HGB 15.2* 14.8  HCT 46.4* 45.1  MCV 94.1 94.7  PLT 299 299    Cardiac Enzymes:  Recent Labs Lab 09/24/15 1722 09/24/15 1928 09/25/15 0118 09/25/15 0645  CKTOTAL 7045*  --   --   --   TROPONINI  --  0.09*  0.09* 0.09*    BNP: No results for input(s): PROBNP in the last 8760 hours.  Coagulation: No results for input(s): INR in the last 168 hours.  ECG:   Medications:   Scheduled Medications: . apixaban  2.5 mg Oral BID  . aspirin EC  81 mg Oral Daily  . digoxin  0.125 mg Oral Daily  . lactulose  10 g Oral BID  . metoprolol tartrate  37.5 mg Oral BID  . pantoprazole  40 mg Oral Daily  . potassium chloride  20 mEq Oral BID  . potassium chloride  40 mEq Oral Once  . vitamin B-12  1,000 mcg Oral q morning - 10a     Infusions:     PRN Medications:  nitroGLYCERIN, traMADol     Assessment/Plan   1. PAF - elevated rates this admission. Soft bp's have  limited managements. No amio given her abnormal liver tests. Started on digoxin yesterday - CHADS2Vasc score of 8, she is on eliquis 2.5mg  bid   2. Chronic systolic HF - echo [id number] LVEF 35-40%, diffuse hypokinesis, cannot eval diastolic function. Moderate MR, mild to moderate RV dysfunction. Mod TR, PASP 38 - weight is below her baseline (typically around 130 lbs). - drop in LVEF from 07/2015 at 45-50%, down to 35-40% this admission. Recent cath 07/2015 with nonobstructive disease. Potential tachycardia mediated, work on increased rate control - in setting of systolic dysfunction change lopressor to Toprol XL. - she appears dry by exam, continue to hold diuretics. Do not see any connection with her liver dysfunction and heart failure at this time.   3. Elevated LFTs -unclear etiology, this does not appear to be cardiac related.         Nancy [name], M.D.

## 2015-09-28 NOTE — Progress Notes (Addendum)
Progress Note    Nancy [name]  [id number] DOB: [birth year]  DOA: 09/24/2015 PCP: [name], MD    Brief Narrative:   Nancy [name] is an 82 y.o. female with a PMH of recent non-STEMI, pulmonary hypertension, valvular heart disease, PAF and stage III chronic kidney disease who was admitted 09/24/15 with a chief complaint of worsening fatigue. Upon initial evaluation in the ED, patient was found to have elevated LFTs. A right upper quadrant ultrasound showed gallstones without ultrasound findings of gallbladder inflammation. No abnormalities of the liver were noted.  Assessment/Plan:   Principal Problem:   Elevated LFTs/cholelithiasis Ultrasound negative for gallbladder thickening.  No RUQ tenderness.  MRCP showed cholelithiasis but no findings of acute cholecystitis, no biliary ductal dilatation, no choledocholithiasis and a normal appearing liver. Acute hepatitis serologies negative. LFTs continue to rise. Crestor on hold. Will ask GI to assist with evaluation.  Active Problems:   Elevated CK Re-check in a.m. Crestor on hold.    Acute on chronic systolic and diastolic CHF / demand ischemia EF 45% and grade III diastolic dysfunction 0000000.  CXR on admission showed cardiomegaly and a small right pleural effusion.  BNP 1347.  Strict I/O, daily weights. Troponins mildly elevated with flat trend.  Family refusing medications at this time. Repeat echocardiogram showed an EF of 35-40 percent, diffuse hypokinesis, and LV diastolic dysfunction due to atrial fibrillation. Evaluated by cardiologist 09/27/15. Digoxin started.    PAF Continue Eliquis and metoprolol.    SOB (shortness of breath) No current complaints of dyspnea.    Fatigue Suspect from decompensated CHF, MRCP negative.  PT/OT evaluations.  Family Communication/Anticipated D/C date and plan/Code Status   DVT prophylaxis:  Eliquis ordered. Code Status: Full Code.  Family Communication: Laverna Peace, son, updated today.  [id number]. Disposition Plan: From [place].  Laverna Peace is thinking of taking her home to live with him.   Medical Consultants:    Cardiology  Gastroenterology   Procedures:   2 D Echo MRCP  Anti-Infectives:   Anti-infectives    None      Subjective:   Nancy [name] continues to feel fatigued but continues to deny dyspnea, chest pain, or abdominal pain.   Objective:    Filed Vitals:   09/27/15 1512 09/27/15 2221 09/28/15 0555 09/28/15 0556  BP: 126/84 120/62 103/81   Pulse: 123 96 94 94  Temp: 97.8 F (36.6 C) 97.8 F (36.6 C) 97.9 F (36.6 C)   TempSrc: Oral Oral Oral   Resp: 16 18 16    Height:      Weight:   54 kg (119 lb 0.8 oz)   SpO2: 97% 98% 97% 99%    Intake/Output Summary (Last 24 hours) at 09/28/15 0841 Last data filed at 09/28/15 0645  Gross per 24 hour  Intake    200 ml  Output      0 ml  Net    200 ml   Filed Weights   09/26/15 0517 09/27/15 0628 09/28/15 0555  Weight: 54.9 kg (121 lb 0.5 oz) 53.2 kg (117 lb 4.6 oz) 54 kg (119 lb 0.8 oz)    Exam: General exam: Appears calm and comfortable.  Respiratory system: Diminished breath sounds, poor effort. Cardiovascular system: HSIR. Hyperdynamic precordium. No JVD,  rubs, gallops or clicks. No murmurs. Gastrointestinal system: Abdomen is nondistended, soft and nontender. No organomegaly or masses felt. Normal bowel sounds heard. Central nervous system: Sleepy, oriented x 2. No focal neurological deficits. Extremities: No clubbing, edema, or cyanosis.  Skin: No rashes, lesions or ulcers Psychiatry: Judgement and insight appear normal. Mood & affect flat.   Data Reviewed:   I have personally reviewed following labs and imaging studies:  Labs: Basic Metabolic Panel:  Recent Labs Lab 09/24/15 1928 09/25/15 0120 09/25/15 0855 09/26/15 0703 09/27/15 0543 09/28/15 0507  NA  --  140 139 140 137 138  K  --  3.5 4.1 3.0* 3.0* 4.0  CL  --  104 104 101 101 101  CO2  --  30 28 27  27 28   GLUCOSE  --  133* 160* 107* 95 118*  BUN  --  19 21* 20 26* 28*  CREATININE  --  1.71* 1.74* 1.65* 1.55* 1.53*  CALCIUM  --  8.9 8.9 8.8* 8.8* 9.1  MG 2.2  --   --   --   --   --   PHOS 2.0* 1.3*  --   --   --   --    GFR Estimated Creatinine Clearance: 22.1 mL/min (by C-G formula based on Cr of 1.53). Liver Function Tests:  Recent Labs Lab 09/24/15 1542 09/25/15 0120 09/25/15 0855 09/25/15 1900 09/28/15 0507  AST 367*  --  429* 518* 855*  ALT 145*  --  157* 187* 415*  ALKPHOS 184*  --  185* 176* 169*  BILITOT 1.3*  --  1.5* 1.3* 1.5*  PROT 7.3  --  6.9 7.0 6.4*  ALBUMIN 2.8* 2.4* 2.6* 2.7* 2.3*    Recent Labs Lab 09/24/15 1722  AMMONIA 38*   CBC:  Recent Labs Lab 09/24/15 1542 09/25/15 0645  WBC 10.7* 10.8*  NEUTROABS 8.3*  --   HGB 15.2* 14.8  HCT 46.4* 45.1  MCV 94.1 94.7  PLT 299 299   Cardiac Enzymes:  Recent Labs Lab 09/24/15 1722 09/24/15 1928 09/25/15 0118 09/25/15 0645  CKTOTAL 7045*  --   --   --   TROPONINI  --  0.09* 0.09* 0.09*   Thyroid function studies: No results for input(s): TSH, T4TOTAL, T3FREE, THYROIDAB in the last 72 hours.  Invalid input(s): FREET3 Sepsis Labs:  Recent Labs Lab 09/24/15 1542 09/25/15 0645  WBC 10.7* 10.8*   Urine analysis:    Component Value Date/Time   COLORURINE YELLOW 09/24/2015 1542   APPEARANCEUR CLEAR 09/24/2015 1542   LABSPEC 1.014 09/24/2015 1542   PHURINE 7.0 09/24/2015 1542   GLUCOSEU 250* 09/24/2015 1542   HGBUR LARGE* 09/24/2015 1542   BILIRUBINUR NEGATIVE 09/24/2015 1542   KETONESUR NEGATIVE 09/24/2015 1542   PROTEINUR 100* 09/24/2015 1542   NITRITE NEGATIVE 09/24/2015 1542   LEUKOCYTESUR NEGATIVE 09/24/2015 1542   Microbiology Recent Results (from the past 240 hour(s))  MRSA PCR Screening     Status: None   Collection Time: 09/25/15  5:17 AM  Result Value Ref Range Status   MRSA by PCR NEGATIVE NEGATIVE Final    Comment:        The GeneXpert MRSA Assay (FDA approved  for NASAL specimens only), is one component of a comprehensive MRSA colonization surveillance program. It is not intended to diagnose MRSA infection nor to guide or monitor treatment for MRSA infections.     Radiology: Mr Abdomen Mrcp Wo Cm  09/26/2015  CLINICAL DATA:  82 year old female inpatient with cholelithiasis and elevated liver function tests. EXAM: MRI ABDOMEN WITHOUT CONTRAST  (INCLUDING MRCP) TECHNIQUE: Multiplanar multisequence MR imaging of the abdomen was performed. Heavily T2-weighted images of the biliary and pancreatic ducts were obtained, and three-dimensional MRCP images were rendered  by post processing. COMPARISON:  09/25/2015 right upper quadrant abdominal sonogram. FINDINGS: Lower chest: Small layering right pleural effusion. Hepatobiliary: Normal liver size and configuration. No hepatic steatosis. No liver mass. Subcentimeter gallstone is seen in the gallbladder, with no gallbladder wall thickening or pericholecystic fluid. Normal variant Phrygian cap. No biliary ductal dilatation. Common bile duct diameter 4 mm. No choledocholithiasis. No evidence of a biliary stricture. Pancreas: No pancreatic mass or duct dilation.  No pancreas divisum. Spleen: Normal size. No mass. Adrenals/Urinary Tract: Normal adrenals. No hydronephrosis. Subcentimeter simple appearing renal cyst in the posterior interpolar left kidney. No additional renal lesions. Stomach/Bowel: Grossly normal stomach. Visualized small and large bowel is normal caliber, with no bowel wall thickening. Vascular/Lymphatic: Normal caliber abdominal aorta. No pathologically enlarged lymph nodes in the abdomen. Other: No abdominal ascites or focal fluid collection. Musculoskeletal: No aggressive appearing focal osseous lesions. IMPRESSION: 1. Cholelithiasis. No unenhanced MRI findings of acute cholecystitis. 2. No biliary ductal dilatation.  No choledocholithiasis. 3. Normal liver. 4. Small layering right pleural effusion.  Electronically Signed   By: Ilona Sorrel M.D.   On: 09/26/2015 21:05   Mr 3d Recon At Scanner  09/26/2015  CLINICAL DATA:  82 year old female inpatient with cholelithiasis and elevated liver function tests. EXAM: MRI ABDOMEN WITHOUT CONTRAST  (INCLUDING MRCP) TECHNIQUE: Multiplanar multisequence MR imaging of the abdomen was performed. Heavily T2-weighted images of the biliary and pancreatic ducts were obtained, and three-dimensional MRCP images were rendered by post processing. COMPARISON:  09/25/2015 right upper quadrant abdominal sonogram. FINDINGS: Lower chest: Small layering right pleural effusion. Hepatobiliary: Normal liver size and configuration. No hepatic steatosis. No liver mass. Subcentimeter gallstone is seen in the gallbladder, with no gallbladder wall thickening or pericholecystic fluid. Normal variant Phrygian cap. No biliary ductal dilatation. Common bile duct diameter 4 mm. No choledocholithiasis. No evidence of a biliary stricture. Pancreas: No pancreatic mass or duct dilation.  No pancreas divisum. Spleen: Normal size. No mass. Adrenals/Urinary Tract: Normal adrenals. No hydronephrosis. Subcentimeter simple appearing renal cyst in the posterior interpolar left kidney. No additional renal lesions. Stomach/Bowel: Grossly normal stomach. Visualized small and large bowel is normal caliber, with no bowel wall thickening. Vascular/Lymphatic: Normal caliber abdominal aorta. No pathologically enlarged lymph nodes in the abdomen. Other: No abdominal ascites or focal fluid collection. Musculoskeletal: No aggressive appearing focal osseous lesions. IMPRESSION: 1. Cholelithiasis. No unenhanced MRI findings of acute cholecystitis. 2. No biliary ductal dilatation.  No choledocholithiasis. 3. Normal liver. 4. Small layering right pleural effusion. Electronically Signed   By: Ilona Sorrel M.D.   On: 09/26/2015 21:05    Medications:   . apixaban  2.5 mg Oral BID  . aspirin EC  81 mg Oral Daily  .  digoxin  0.125 mg Oral Daily  . lactulose  10 g Oral BID  . metoprolol tartrate  37.5 mg Oral BID  . pantoprazole  40 mg Oral Daily  . potassium chloride  20 mEq Oral BID  . potassium chloride  40 mEq Oral Once  . vitamin B-12  1,000 mcg Oral q morning - 10a   Continuous Infusions:   Time spent: 35 minutes with > 50% of time discussing current diagnostic test results, clinical impression and plan of care.   LOS: 4 days   [facility] Pager [phone]. If unable to reach me by pager, please call my cell phone at [phone].  *Please refer to [email or web address], password [email or web address] to get updated schedule on who will round on this patient, as hospitalists  switch teams weekly. If 7PM-7AM, please contact night-coverage at [email or web address], password [email or web address] for any overnight needs.  09/28/2015, 8:41 AM

## 2015-09-29 ENCOUNTER — Telehealth: Payer: Self-pay | Admitting: Internal Medicine

## 2015-09-29 ENCOUNTER — Ambulatory Visit: Payer: Medicare Other | Admitting: Internal Medicine

## 2015-09-29 DIAGNOSIS — I5043 Acute on chronic combined systolic (congestive) and diastolic (congestive) heart failure: Secondary | ICD-10-CM

## 2015-09-29 DIAGNOSIS — R413 Other amnesia: Secondary | ICD-10-CM | POA: Diagnosis not present

## 2015-09-29 DIAGNOSIS — I480 Paroxysmal atrial fibrillation: Secondary | ICD-10-CM | POA: Diagnosis not present

## 2015-09-29 DIAGNOSIS — S36119A Unspecified injury of liver, initial encounter: Secondary | ICD-10-CM | POA: Insufficient documentation

## 2015-09-29 DIAGNOSIS — R627 Adult failure to thrive: Secondary | ICD-10-CM | POA: Diagnosis not present

## 2015-09-29 DIAGNOSIS — Z7901 Long term (current) use of anticoagulants: Secondary | ICD-10-CM | POA: Diagnosis not present

## 2015-09-29 DIAGNOSIS — K921 Melena: Secondary | ICD-10-CM | POA: Diagnosis not present

## 2015-09-29 DIAGNOSIS — I509 Heart failure, unspecified: Secondary | ICD-10-CM | POA: Diagnosis not present

## 2015-09-29 DIAGNOSIS — K8020 Calculus of gallbladder without cholecystitis without obstruction: Secondary | ICD-10-CM

## 2015-09-29 DIAGNOSIS — R5383 Other fatigue: Secondary | ICD-10-CM | POA: Diagnosis not present

## 2015-09-29 DIAGNOSIS — S36119D Unspecified injury of liver, subsequent encounter: Secondary | ICD-10-CM

## 2015-09-29 DIAGNOSIS — I340 Nonrheumatic mitral (valve) insufficiency: Secondary | ICD-10-CM | POA: Diagnosis not present

## 2015-09-29 LAB — HEPATIC FUNCTION PANEL
ALT: 457 U/L — ABNORMAL HIGH (ref 14–54)
AST: 724 U/L — ABNORMAL HIGH (ref 15–41)
Albumin: 2.3 g/dL — ABNORMAL LOW (ref 3.5–5.0)
Alkaline Phosphatase: 171 U/L — ABNORMAL HIGH (ref 38–126)
BILIRUBIN DIRECT: 0.7 mg/dL — AB (ref 0.1–0.5)
Indirect Bilirubin: 1.6 mg/dL — ABNORMAL HIGH (ref 0.3–0.9)
Total Bilirubin: 2.3 mg/dL — ABNORMAL HIGH (ref 0.3–1.2)
Total Protein: 6 g/dL — ABNORMAL LOW (ref 6.5–8.1)

## 2015-09-29 LAB — CK: CK TOTAL: 8800 U/L — AB (ref 38–234)

## 2015-09-29 LAB — FERRITIN: Ferritin: 200 ng/mL (ref 11–307)

## 2015-09-29 LAB — IGG: IGG (IMMUNOGLOBIN G), SERUM: 950 mg/dL (ref 700–1600)

## 2015-09-29 LAB — EBV AB TO VIRAL CAPSID AG PNL, IGG+IGM

## 2015-09-29 LAB — CMV IGM

## 2015-09-29 MED ORDER — SODIUM CHLORIDE 0.9 % IV SOLN: INTRAVENOUS | Status: DC

## 2015-09-29 NOTE — Telephone Encounter (Signed)
Spoke with son and let him know he would need to discuss all with her nurse at the hospital.  He verbalized understanding

## 2015-09-29 NOTE — Consult Note (Signed)
   [facility] CM Inpatient Consult   09/29/2015  Nancy [name] Jan 09, 1926 [id number]  Patient was evaluated for [facility] Management services for frequent admissions.   Came by to see the patient and she was asleep and no family in the room.  Did not disturb at the time.  Will follow up for community care management needs. For questions, please contact:   Natividad Brood, RN BSN [facility] Liaison  [phone] business mobile phone Toll free office [phone]

## 2015-09-29 NOTE — Progress Notes (Signed)
Occupational Therapy Treatment Patient Details Name: Nancy [name] MRN: [id number] DOB: [birth year] Today's Date: 09/29/2015    History of present illness Patient is a 82 y/o female with hx of dyslipidemia, PAF, depression, CKD, mitral regurgitation and migraine presents with worsening fatigue. CXR- small right pleural effusion. Found to have Gallstones without ultrasound findings of gallbladder inflammation.   OT comments  This 82 yo female admitted for above presents to acute OT with not really making progress from eval (pt with fatigue and tiredness) thus continuing to affect her )PLOF of independent at ALF (A for meals only). She will continue to benefit from acute OT with follow up [place]. I spoke with son Laverna Peace) over the phone due to my concern of pt being able to care for herself as she was pta. He was at ALF as we spoke and was making sure she would have the A she would need when she returned there.   Follow Up Recommendations  Home health OT;Supervision/Assistance - 24 hour;Other (comment) (and someone with her any time she is up on her feet)    Equipment Recommendations  3 in 1 bedside comode    \   Precautions / Restrictions Precautions Precautions: Fall Restrictions Weight Bearing Restrictions: No       Mobility Bed Mobility Overal bed mobility: Needs Assistance Bed Mobility: Rolling;Sidelying to Sit Rolling: Supervision (cues for technique) Sidelying to sit: Min assist      Transfers Overall transfer level: Needs assistance Equipment used: Rolling walker (2 wheeled) Transfers: Sit to/from Stand Sit to Stand: Min guard         General transfer comment: Ambulation to 3n1 in bathroom and then back to recliner    Balance Overall balance assessment: Needs assistance Sitting-balance support: No upper extremity supported;Feet supported Sitting balance-Leahy Scale: Good     Standing balance support: Bilateral upper extremity supported Standing balance-Leahy  Scale: Poor Standing balance comment: intermittent support of RW with static and dynamic standing                   ADL Overall ADL's : Needs assistance/impaired     Grooming: Brushing hair;Moderate assistance;Sitting Grooming Details (indicate cue type and reason): increased A due to a "rat's nest" in her hair and trying to get bobby pin in it                 Toilet Transfer: Min guard;Ambulation;RW;BSC (in bathroom)   Toileting- Clothing Manipulation and Hygiene: Min guard;Sit to/from stand                          Cognition   Behavior During Therapy: [facility] for tasks assessed/performed Overall Cognitive Status: Within Functional Limits for tasks assessed       Memory: Decreased short-term memory (doesn't remember eating this morning or walking with PT earlier)               Extremity/Trunk Assessment  Upper Extremity Assessment Upper Extremity Assessment: Generalized weakness                       Pertinent Vitals/ Pain       Pain Assessment: No/denies pain      Prior Functioning/Environment Level of Independence: Independent with assistive device(s)        Comments: Sponge bathes standing at sink, uses RW, walks to dining hall, receives PT/OT everyday at ALF   Frequency Min 2X/week     Progress Toward Goals  OT Goals(current goals can now be found in the care plan section)  Progress towards OT goals: Not progressing toward goals - comment (pt with increased fatigue today limiting her progress towards goals. Will continue to benefit from acute OT with follow up [place] to get back to PLOF)  Acute Rehab OT Goals Patient Stated Goal: go home  Plan Discharge plan remains appropriate       End of Session Equipment Utilized During Treatment: Gait belt;Rolling walker   Activity Tolerance  (pt c/o fatigue)   Patient Left in chair;with call bell/phone within reach;with chair alarm set           Time: 1209-1229 OT Time  Calculation (min): 20 min  Charges: OT General Charges $OT Visit: 1 Procedure OT Treatments $Self Care/Home Management : 8-22 mins  Almon Register [id number] 09/29/2015, 12:40 PM

## 2015-09-29 NOTE — Progress Notes (Signed)
Patient Name: Nancy [name] Date of Encounter: 09/29/2015   SUBJECTIVE  Feels weak. No chest pain or sob.   CURRENT MEDS . apixaban  2.5 mg Oral BID  . aspirin EC  81 mg Oral Daily  . digoxin  0.125 mg Oral Daily  . lactulose  10 g Oral BID  . metoprolol succinate  37.5 mg Oral BID  . pantoprazole  40 mg Oral Daily  . potassium chloride  20 mEq Oral BID  . potassium chloride  40 mEq Oral Once  . vitamin B-12  1,000 mcg Oral q morning - 10a    OBJECTIVE  Filed Vitals:   09/28/15 1502 09/28/15 2205 09/29/15 0354 09/29/15 0545  BP: 107/67 125/68  118/76  Pulse: 90 88  84  Temp: 97.5 F (36.4 C) 98.2 F (36.8 C)  98.1 F (36.7 C)  TempSrc: Oral Oral  Oral  Resp: '16 16  16  '$ Height:      Weight:   117 lb 8.1 oz (53.3 kg)   SpO2: 96% 98%  93%    Intake/Output Summary (Last 24 hours) at 09/29/15 1054 Last data filed at 09/29/15 1009  Gross per 24 hour  Intake    360 ml  Output    100 ml  Net    260 ml   Filed Weights   09/27/15 0628 09/28/15 0555 09/29/15 0354  Weight: 117 lb 4.6 oz (53.2 kg) 119 lb 0.8 oz (54 kg) 117 lb 8.1 oz (53.3 kg)    PHYSICAL EXAM  General: Pleasant, NAD. Neuro: Alert and oriented X 3. Moves all extremities spontaneously. Psych: Normal affect. HEENT:  Normal  Neck: Supple without bruits or JVD. Lungs:  Resp regular and unlabored, CTA. Heart: irregular  no s3, s4, or murmurs. Abdomen: Soft, non-tender, non-distended, BS + x 4.  Extremities: No clubbing, cyanosis or edema. DP/PT/Radials 2+ and equal bilaterally.  Accessory Clinical Findings  CBC No results for input(s): WBC, NEUTROABS, HGB, HCT, MCV, PLT in the last 72 hours. Basic Metabolic Panel  Recent Labs  09/27/15 0543 09/28/15 0507  NA 137 138  K 3.0* 4.0  CL 101 101  CO2 27 28  GLUCOSE 95 118*  BUN 26* 28*  CREATININE 1.55* 1.53*  CALCIUM 8.8* 9.1   Liver Function Tests  Recent Labs  09/28/15 0507 09/29/15 0713  AST 855* 724*  ALT 415* 457*  ALKPHOS 169*  171*  BILITOT 1.5* 2.3*  PROT 6.4* 6.0*  ALBUMIN 2.3* 2.3*   No results for input(s): LIPASE, AMYLASE in the last 72 hours. Cardiac Enzymes  Recent Labs  09/28/15 1158 09/29/15 0713  CKTOTAL 92119* 8800*   BNP Invalid input(s): POCBNP D-Dimer No results for input(s): DDIMER in the last 72 hours. Hemoglobin A1C No results for input(s): HGBA1C in the last 72 hours. Fasting Lipid Panel No results for input(s): CHOL, HDL, LDLCALC, TRIG, CHOLHDL, LDLDIRECT in the last 72 hours. Thyroid Function Tests No results for input(s): TSH, T4TOTAL, T3FREE, THYROIDAB in the last 72 hours.  Invalid input(s): FREET3  TELE  Afib at rate of 80-90s  Radiology/Studies  Dg Chest 2 View  09/24/2015  CLINICAL DATA:  Decreased appetite for 1 month. Chronic shortness of breath. EXAM: CHEST  2 VIEW COMPARISON:  08/30/2015 FINDINGS: Cardiomegaly. No overt edema. No confluent airspace opacities. Small right pleural effusion. No acute bony abnormality. IMPRESSION: Cardiomegaly. Small right pleural effusion. Electronically Signed   By: Rolm Baptise M.D.   On: 09/24/2015 16:32   Dg Chest 2  View  08/30/2015  CLINICAL DATA:  82 year old female with acute onset of right-sided shoulder pain at 2 a.m. yesterday evening. Midsternal chest pain developing at 7 a.m. this morning. Intermittent shortness of breath. EXAM: CHEST  2 VIEW COMPARISON:  Chest x-ray 07/20/2015. FINDINGS: Trace bilateral pleural effusions. No acute consolidative airspace disease. Mild cephalization of the pulmonary vasculature with indistinct interstitial markings and thickening of the fissures, suggestive of mild interstitial pulmonary edema. Mild cardiomegaly. No pneumothorax. Upper mediastinal contours are within normal limits. Atherosclerosis in the thoracic aorta. IMPRESSION: 1. The appearance the chest suggests very mild congestive heart failure. 2. Atherosclerosis. Electronically Signed   By: Vinnie Langton M.D.   On: 08/30/2015 11:41    US Abdomen Complete  08/30/2015  CLINICAL DATA:  Patient with chest pain and right shoulder pain. EXAM: ABDOMEN ULTRASOUND COMPLETE COMPARISON:  Chest CT 01/06/2015 FINDINGS: Gallbladder: There is a nonmobile shadowing stone within the gallbladder lumen and associated sludge. No gallbladder wall thickening or pericholecystic fluid. Negative sonographic Murphy sign. Common bile duct: Diameter: 3 mm Liver: No focal lesion identified. Within normal limits in parenchymal echogenicity. IVC: No abnormality visualized. Pancreas: Visualized portion unremarkable. Spleen: Size and appearance within normal limits. Right Kidney: Length: 8.8 cm. Increased renal cortical echogenicity. Normal renal cortical thickness. No hydronephrosis. Left Kidney: Length: 8.6 cm. Increased renal cortical echogenicity. Normal renal cortical thickness. No hydronephrosis. Abdominal aorta: No aneurysm visualized. Other findings: None. IMPRESSION: Cholelithiasis without sonographic evidence for acute cholecystitis. Increased renal cortical echogenicity as can be seen with chronic medical renal disease. No hydronephrosis. Electronically Signed   By: Lovey Newcomer M.D.   On: 08/30/2015 20:37   US Renal  09/25/2015  CLINICAL DATA:  82 year old female with acute kidney insufficiency. Subsequent encounter. EXAM: RENAL / URINARY TRACT ULTRASOUND COMPLETE COMPARISON:  08/30/2015 ultrasound. FINDINGS: Right Kidney: Length: 9 cm. Increased echogenicity consistent with changes of medical renal disease. No hydronephrosis or mass. Left Kidney: Length: 9 cm. Increased echogenicity consistent with changes medical renal disease. No hydronephrosis or mass. Bladder: Appears normal for degree of bladder distention. Right-sided pleural effusion IMPRESSION: Bilateral increased renal echogenicity consistent with changes of medical renal disease. No hydronephrosis. Right-sided pleural effusion. Electronically Signed   By: Genia Del M.D.   On: 09/25/2015 07:51    Mr Abdomen Mrcp Wo Cm  09/26/2015  CLINICAL DATA:  82 year old female inpatient with cholelithiasis and elevated liver function tests. EXAM: MRI ABDOMEN WITHOUT CONTRAST  (INCLUDING MRCP) TECHNIQUE: Multiplanar multisequence MR imaging of the abdomen was performed. Heavily T2-weighted images of the biliary and pancreatic ducts were obtained, and three-dimensional MRCP images were rendered by post processing. COMPARISON:  09/25/2015 right upper quadrant abdominal sonogram. FINDINGS: Lower chest: Small layering right pleural effusion. Hepatobiliary: Normal liver size and configuration. No hepatic steatosis. No liver mass. Subcentimeter gallstone is seen in the gallbladder, with no gallbladder wall thickening or pericholecystic fluid. Normal variant Phrygian cap. No biliary ductal dilatation. Common bile duct diameter 4 mm. No choledocholithiasis. No evidence of a biliary stricture. Pancreas: No pancreatic mass or duct dilation.  No pancreas divisum. Spleen: Normal size. No mass. Adrenals/Urinary Tract: Normal adrenals. No hydronephrosis. Subcentimeter simple appearing renal cyst in the posterior interpolar left kidney. No additional renal lesions. Stomach/Bowel: Grossly normal stomach. Visualized small and large bowel is normal caliber, with no bowel wall thickening. Vascular/Lymphatic: Normal caliber abdominal aorta. No pathologically enlarged lymph nodes in the abdomen. Other: No abdominal ascites or focal fluid collection. Musculoskeletal: No aggressive appearing focal osseous lesions. IMPRESSION: 1. Cholelithiasis.  No unenhanced MRI findings of acute cholecystitis. 2. No biliary ductal dilatation.  No choledocholithiasis. 3. Normal liver. 4. Small layering right pleural effusion. Electronically Signed   By: Ilona Sorrel M.D.   On: 09/26/2015 21:05   Mr 3d Recon At Scanner  09/26/2015  CLINICAL DATA:  82 year old female inpatient with cholelithiasis and elevated liver function tests. EXAM: MRI ABDOMEN  WITHOUT CONTRAST  (INCLUDING MRCP) TECHNIQUE: Multiplanar multisequence MR imaging of the abdomen was performed. Heavily T2-weighted images of the biliary and pancreatic ducts were obtained, and three-dimensional MRCP images were rendered by post processing. COMPARISON:  09/25/2015 right upper quadrant abdominal sonogram. FINDINGS: Lower chest: Small layering right pleural effusion. Hepatobiliary: Normal liver size and configuration. No hepatic steatosis. No liver mass. Subcentimeter gallstone is seen in the gallbladder, with no gallbladder wall thickening or pericholecystic fluid. Normal variant Phrygian cap. No biliary ductal dilatation. Common bile duct diameter 4 mm. No choledocholithiasis. No evidence of a biliary stricture. Pancreas: No pancreatic mass or duct dilation.  No pancreas divisum. Spleen: Normal size. No mass. Adrenals/Urinary Tract: Normal adrenals. No hydronephrosis. Subcentimeter simple appearing renal cyst in the posterior interpolar left kidney. No additional renal lesions. Stomach/Bowel: Grossly normal stomach. Visualized small and large bowel is normal caliber, with no bowel wall thickening. Vascular/Lymphatic: Normal caliber abdominal aorta. No pathologically enlarged lymph nodes in the abdomen. Other: No abdominal ascites or focal fluid collection. Musculoskeletal: No aggressive appearing focal osseous lesions. IMPRESSION: 1. Cholelithiasis. No unenhanced MRI findings of acute cholecystitis. 2. No biliary ductal dilatation.  No choledocholithiasis. 3. Normal liver. 4. Small layering right pleural effusion. Electronically Signed   By: Ilona Sorrel M.D.   On: 09/26/2015 21:05   US Abdomen Limited Ruq  09/25/2015  CLINICAL DATA:  82 year old female with acute kidney insufficiency, elevated liver enzymes, weakness and fatigue. Subsequent encounter. EXAM: US ABDOMEN LIMITED - RIGHT UPPER QUADRANT COMPARISON:  08/30/2015 ultrasound. FINDINGS: Gallbladder: Mobile gallstones measuring up to 8  mm. Phrygian cap incidentally noted. No gallbladder wall thickening or pericholecystic fluid. Common bile duct: Diameter: 4 mm proximally. Mid to distal aspect not visualized secondary to bowel gas. Liver: No focal lesion identified. Within normal limits in parenchymal echogenicity. Right-sided pleural effusion. IMPRESSION: Gallstones without ultrasound findings of gallbladder inflammation. No liver abnormality noted. Right-sided pleural effusion. Electronically Signed   By: Genia Del M.D.   On: 09/25/2015 07:01    ASSESSMENT AND PLAN  1. PAF - Elevated rates this admission. Soft bp's have limited managements. No amio given her abnormal liver tests. Started on digoxin 09/27/15. Now rate in 80-90s.  - CHADS2Vasc score of 8, she is on eliquis 2.'5mg'$  bid. Dr. Rockne Menghini called and updated that the patient is not taking Eliquis for the past 7-10 days prior to admission. On ASA. Dr. Claiborne Billings to review.   2. Chronic systolic HF - Echo 07/12/8117 showed LVEF 35-40%, diffuse hypokinesis, cannot eval diastolic function. Moderate MR, mild to moderate RV dysfunction. Mod TR, PASP 38 - weight is below her baseline (typically around 130 lbs). - Drop in LVEF from 07/2015 at 45-50%, down to 35-40% this admission. Recent cath 07/2015 with nonobstructive disease. Likely tachycardia mediated cardiomyopathy.  - Continue ASA ?, BB, digoxin.  - She appears dry by exam, continue to hold diuretics. Likely PRN lasix at discharge. HF education given.   3. Elevated LFTs -GI felt likely due to statin induce.   Jarrett Soho PA-C Pager [phone]   Patient seen and examined. Agree with assessment and plan. No chest pain;  c/o fatigue. LFTs remain significantly elevated but slightly improved.  Gallstones without cholecystitis. Marked increase in CPK will check aldolase, ESR. Off amiodarone with LFT elevation.  Now on dig, keep level <1.0. Eliquis was resumed. ? Fall risk. If to stay on eliquis would dc ASA.  Renal  function slightly better off diuretic with Cr 1.74 -> 1.53. Continue BB and dig for rate control.   Troy Sine, MD, [facility] 09/29/2015 3:26 PM

## 2015-09-29 NOTE — Progress Notes (Signed)
Physical Therapy Treatment Patient Details Name: Nancy [name] MRN: [id number] DOB: Jul 07, 1925 Today's Date: 09/29/2015    History of Present Illness Patient is a 82 y/o female with hx of dyslipidemia, PAF, depression, CKD, mitral regurgitation and migraine presents with worsening fatigue. CXR- small right pleural effusion. Found to have Gallstones without ultrasound findings of gallbladder inflammation.    PT Comments    Patient is making gradual progress toward mobility goals. Continues to be limited by fatigue and needs min A for transfers and bed mobility. C/o SOB during ambulation with SpO2 from 97-93% on RA. Educated pt on breathing technique. Son present in room. Current plan remains appropriate.   Follow Up Recommendations  Home health PT;Supervision for mobility/OOB (at ALF)     Equipment Recommendations  None recommended by PT    Recommendations for Other Services OT consult     Precautions / Restrictions Precautions Precautions: Fall Restrictions Weight Bearing Restrictions: No    Mobility  Bed Mobility Overal bed mobility: Needs Assistance Bed Mobility: Supine to Sit     Supine to sit: Min assist;HOB elevated     General bed mobility comments: assist to elevate trunk into sitting and tactile/verbal cues for bringing bilat LE to EOB   Transfers Overall transfer level: Needs assistance Equipment used: Rolling walker (2 wheeled) Transfers: Sit to/from Stand Sit to Stand: Min assist         General transfer comment: from EOB and commode; cues for hand placement and technique; assist to power up into standing  Ambulation/Gait Ambulation/Gait assistance: Min guard Ambulation Distance (Feet): 175 Feet Assistive device: Rolling walker (2 wheeled) Gait Pattern/deviations: Step-through pattern;Decreased stride length;Decreased step length - left;Trunk flexed Gait velocity: decreased   General Gait Details: 3 standing rest breaks with pt c/o SOB; SpO2  between 93-97%; educated on breathing technique; cues for posture and bilat step length/heel strike   Stairs            Wheelchair Mobility    Modified Rankin (Stroke Patients Only)       Balance     Sitting balance-Leahy Scale: Good     Standing balance support: Bilateral upper extremity supported Standing balance-Leahy Scale: Poor                      Cognition Arousal/Alertness: Awake/alert Behavior During Therapy: WFL for tasks assessed/performed Overall Cognitive Status: Within Functional Limits for tasks assessed                      Exercises      General Comments        Pertinent Vitals/Pain Pain Assessment: Faces Faces Pain Scale: Hurts even more Pain Location: R shoulder with flexion Pain Descriptors / Indicators: Sore;Grimacing;Guarding Pain Intervention(s): Limited activity within patient's tolerance;Monitored during session;Repositioned    Home Living                      Prior Function            PT Goals (current goals can now be found in the care plan section) Acute Rehab PT Goals Patient Stated Goal: go home Progress towards PT goals: Progressing toward goals    Frequency  Min 3X/week    PT Plan Current plan remains appropriate    Co-evaluation             End of Session Equipment Utilized During Treatment: Gait belt Activity Tolerance: Patient tolerated treatment well Patient left: in chair;with  call bell/phone within reach;with chair alarm set;with family/visitor present     Time: [id number] PT Time Calculation (min) (ACUTE ONLY): 31 min  Charges:  $Gait Training: 8-22 mins $Therapeutic Activity: 8-22 mins                    G Codes:      Salina April, PTA Pager: [phone]   09/29/2015, 9:47 AM

## 2015-09-29 NOTE — Care Management Important Message (Signed)
Important Message  Patient Details  Name: Nancy [name] MRN: [id number] Date of Birth: [birth year]   Medicare Important Message Given:  Yes    Nathen May 09/29/2015, 10:48 AM

## 2015-09-29 NOTE — Telephone Encounter (Signed)
New message    The son wants to know what the plan for the pt the pt is in the hospital. The son was hoping the that his Mom would be discharged today. The son is just wondering if they need to change up any medications or what needs to be done.

## 2015-09-29 NOTE — Progress Notes (Signed)
Progress Note    Nancy [name]  [id number] DOB: [birth year]  DOA: 09/24/2015 PCP: [name], MD    Brief Narrative:   Nancy [name] is an 82 y.o. female with a PMH of recent non-STEMI, pulmonary hypertension, valvular heart disease, PAF and stage III chronic kidney disease who was admitted 09/24/15 with a chief complaint of worsening fatigue. Upon initial evaluation in the ED, patient was found to have elevated LFTs. A right upper quadrant ultrasound showed gallstones without ultrasound findings of gallbladder inflammation. No abnormalities of the liver were noted.  Assessment/Plan:   Principal Problem:   Elevated LFTs/cholelithiasis Ultrasound negative for gallbladder thickening.  No RUQ tenderness.  MRCP showed cholelithiasis but no findings of acute cholecystitis, no biliary ductal dilatation, no choledocholithiasis and a normal appearing liver. Acute hepatitis serologies negative. LFTs continue to rise. Crestor discontinued. GI assisting with evaluation. Ferritin 200.  F/U IgG, ANA, anti-smooth muscle antibody, mitochondrial antibodies, EBV antibody, CMV IgM.  Active Problems:   Elevated CK Trending down.Suspect secondary to statin toxicity.    Acute on chronic systolic and diastolic CHF / demand ischemia EF 45% and grade III diastolic dysfunction 0000000.  CXR on admission showed cardiomegaly and a small right pleural effusion.  BNP 1347.  Strict I/O, daily weights. Troponins mildly elevated with flat trend.  Family refusing medications at this time. Repeat echocardiogram showed an EF of 35-40 percent, diffuse hypokinesis, and LV diastolic dysfunction due to atrial fibrillation. Evaluated by cardiologist 09/27/15. Digoxin started.    PAF Continue aspirin and metoprolol. The patient refuses Eliquis.    SOB (shortness of breath) No current complaints of dyspnea.    Fatigue TSH WNL. Suspect from decompensated CHF, MRCP negative.  PT/OT evaluations.  Family  Communication/Anticipated D/C date and plan/Code Status   DVT prophylaxis:  Patient has been refusing Eliquis, SCDs ordered. Code Status: Full Code.  Family Communication: Laverna Peace, son, updated today. [id number]. Disposition Plan: From [place].  Laverna Peace is thinking of taking her home to live with him.  Medical Consultants:    Cardiology  Gastroenterology   Procedures:   2 D Echo MRCP  Anti-Infectives:   Anti-infectives    None      Subjective:   Nancy [name] continues to feel fatigued. She now has reported some leg pain in the recent past. Continues to deny chest pain and dyspnea.   Objective:    Filed Vitals:   09/28/15 1502 09/28/15 2205 09/29/15 0354 09/29/15 0545  BP: 107/67 125/68  118/76  Pulse: 90 88  84  Temp: 97.5 F (36.4 C) 98.2 F (36.8 C)  98.1 F (36.7 C)  TempSrc: Oral Oral  Oral  Resp: 16 16  16   Height:      Weight:   53.3 kg (117 lb 8.1 oz)   SpO2: 96% 98%  93%    Intake/Output Summary (Last 24 hours) at 09/29/15 0955 Last data filed at 09/29/15 0600  Gross per 24 hour  Intake      0 ml  Output    100 ml  Net   -100 ml   Filed Weights   09/27/15 0628 09/28/15 0555 09/29/15 0354  Weight: 53.2 kg (117 lb 4.6 oz) 54 kg (119 lb 0.8 oz) 53.3 kg (117 lb 8.1 oz)    Exam: General exam: Appears calm and comfortable.  Respiratory system: Diminished breath sounds, poor effort. Cardiovascular system: HSIR. Hyperdynamic precordium. No JVD,  rubs, gallops or clicks. No murmurs. Gastrointestinal system: Abdomen is nondistended, soft  and nontender. No organomegaly or masses felt. Normal bowel sounds heard. Central nervous system: Sleepy, oriented x 2. No focal neurological deficits. Extremities: No clubbing, edema, or cyanosis. Skin: No rashes, lesions or ulcers Psychiatry: Judgement and insight appear normal. Mood & affect flat.   Data Reviewed:   I have personally reviewed following labs and imaging studies:  Labs: Basic  Metabolic Panel:  Recent Labs Lab 09/24/15 1928 09/25/15 0120 09/25/15 0855 09/26/15 0703 09/27/15 0543 09/28/15 0507  NA  --  140 139 140 137 138  K  --  3.5 4.1 3.0* 3.0* 4.0  CL  --  104 104 101 101 101  CO2  --  30 28 27 27 28   GLUCOSE  --  133* 160* 107* 95 118*  BUN  --  19 21* 20 26* 28*  CREATININE  --  1.71* 1.74* 1.65* 1.55* 1.53*  CALCIUM  --  8.9 8.9 8.8* 8.8* 9.1  MG 2.2  --   --   --   --   --   PHOS 2.0* 1.3*  --   --   --   --    GFR Estimated Creatinine Clearance: 21.8 mL/min (by C-G formula based on Cr of 1.53). Liver Function Tests:  Recent Labs Lab 09/24/15 1542 09/25/15 0120 09/25/15 0855 09/25/15 1900 09/28/15 0507 09/29/15 0713  AST 367*  --  429* 518* 855* 724*  ALT 145*  --  157* 187* 415* 457*  ALKPHOS 184*  --  185* 176* 169* 171*  BILITOT 1.3*  --  1.5* 1.3* 1.5* 2.3*  PROT 7.3  --  6.9 7.0 6.4* 6.0*  ALBUMIN 2.8* 2.4* 2.6* 2.7* 2.3* 2.3*    Recent Labs Lab 09/24/15 1722  AMMONIA 38*   CBC:  Recent Labs Lab 09/24/15 1542 09/25/15 0645  WBC 10.7* 10.8*  NEUTROABS 8.3*  --   HGB 15.2* 14.8  HCT 46.4* 45.1  MCV 94.1 94.7  PLT 299 299   Cardiac Enzymes:  Recent Labs Lab 09/24/15 1722 09/24/15 1928 09/25/15 0118 09/25/15 0645 09/28/15 1158 09/29/15 0713  CKTOTAL 7045*  --   --   --  13739* 8800*  TROPONINI  --  0.09* 0.09* 0.09*  --   --    Sepsis Labs:  Recent Labs Lab 09/24/15 1542 09/25/15 0645  WBC 10.7* 10.8*   Urine analysis:    Component Value Date/Time   COLORURINE YELLOW 09/24/2015 1542   APPEARANCEUR CLEAR 09/24/2015 1542   LABSPEC 1.014 09/24/2015 1542   PHURINE 7.0 09/24/2015 1542   GLUCOSEU 250* 09/24/2015 1542   HGBUR LARGE* 09/24/2015 1542   BILIRUBINUR NEGATIVE 09/24/2015 1542   KETONESUR NEGATIVE 09/24/2015 1542   PROTEINUR 100* 09/24/2015 1542   NITRITE NEGATIVE 09/24/2015 1542   LEUKOCYTESUR NEGATIVE 09/24/2015 1542   Microbiology Recent Results (from the past 240 hour(s))  MRSA  PCR Screening     Status: None   Collection Time: 09/25/15  5:17 AM  Result Value Ref Range Status   MRSA by PCR NEGATIVE NEGATIVE Final    Comment:        The GeneXpert MRSA Assay (FDA approved for NASAL specimens only), is one component of a comprehensive MRSA colonization surveillance program. It is not intended to diagnose MRSA infection nor to guide or monitor treatment for MRSA infections.     Radiology: No results found.  Medications:   . apixaban  2.5 mg Oral BID  . aspirin EC  81 mg Oral Daily  . digoxin  0.125  mg Oral Daily  . lactulose  10 g Oral BID  . metoprolol succinate  37.5 mg Oral BID  . pantoprazole  40 mg Oral Daily  . potassium chloride  20 mEq Oral BID  . potassium chloride  40 mEq Oral Once  . vitamin B-12  1,000 mcg Oral q morning - 10a   Continuous Infusions:   Time spent: 25 minutes.   LOS: 5 days   [facility] Pager [phone]. If unable to reach me by pager, please call my cell phone at [phone].  *Please refer to [email or web address], password [email or web address] to get updated schedule on who will round on this patient, as hospitalists switch teams weekly. If 7PM-7AM, please contact night-coverage at [email or web address], password [email or web address] for any overnight needs.  09/29/2015, 9:55 AM

## 2015-09-29 NOTE — Progress Notes (Signed)
Pharmacist Heart Failure Core Measure Documentation  Assessment: Nancy [name] has an EF documented as 35-40% on 09/26/15 by Echo.  Rationale: Heart failure patients with left ventricular systolic dysfunction (LVSD) and an EF < 40% should be prescribed an angiotensin converting enzyme inhibitor (ACEI) or angiotensin receptor blocker (ARB) at discharge unless a contraindication is documented in the medical record.  This patient is not currently on an ACEI or ARB for HF.  This note is being placed in the record in order to provide documentation that a contraindication to the use of these agents is present for this encounter.  ACE Inhibitor or Angiotensin Receptor Blocker is contraindicated (specify all that apply)  []   ACEI allergy AND ARB allergy []   Angioedema []   Moderate or severe aortic stenosis []   Hyperkalemia [x]   Hypotension  (soft BP's) []   Renal artery stenosis [x]   Worsening renal function, preexisting renal disease or dysfunction  Nicole Cella, RPh Clinical Pharmacist Pager: [phone]  09/29/2015 2:45 PM

## 2015-09-29 NOTE — Progress Notes (Signed)
Daily Rounding Note  09/29/2015, 10:44 AM  LOS: 5 days   SUBJECTIVE:       No pain nor n/v.  Feels tired.    OBJECTIVE:         Vital signs in last 24 hours:    Temp:  [97.5 F (36.4 C)-98.2 F (36.8 C)] 98.1 F (36.7 C) (06/26 0545) Pulse Rate:  [84-90] 84 (06/26 0545) Resp:  [16] 16 (06/26 0545) BP: (107-125)/(67-76) 118/76 mmHg (06/26 0545) SpO2:  [93 %-98 %] 93 % (06/26 0545) Weight:  [53.3 kg (117 lb 8.1 oz)] 53.3 kg (117 lb 8.1 oz) (06/26 0354) Last BM Date: 09/25/15 Filed Weights   09/27/15 0628 09/28/15 0555 09/29/15 0354  Weight: 53.2 kg (117 lb 4.6 oz) 54 kg (119 lb 0.8 oz) 53.3 kg (117 lb 8.1 oz)   General: pleasant, somewhat frail.  No jaundice or toxic appearance   Heart:  A fib on monitor.  No MRG Chest: clear bil.  No SOB or cough Abdomen: soft, NT, active BS, ND.  Extremities: no CCE Neuro/Psych:  Oriented x 3.  No tremor or gross loss of strength.   Intake/Output from previous day: 06/25 0701 - 06/26 0700 In: -  Out: 100 [Urine:100]  Intake/Output this shift: Total I/O In: 360 [P.O.:360] Out: -   Lab Results: No results for input(s): WBC, HGB, HCT, PLT in the last 72 hours. BMET  Recent Labs  09/27/15 0543 09/28/15 0507  NA 137 138  K 3.0* 4.0  CL 101 101  CO2 27 28  GLUCOSE 95 118*  BUN 26* 28*  CREATININE 1.55* 1.53*  CALCIUM 8.8* 9.1   LFT  Recent Labs  09/28/15 0507 09/29/15 0713  PROT 6.4* 6.0*  ALBUMIN 2.3* 2.3*  AST 855* 724*  ALT 415* 457*  ALKPHOS 169* 171*  BILITOT 1.5* 2.3*  BILIDIR  --  0.7*  IBILI  --  1.6*   PT/INR No results for input(s): LABPROT, INR in the last 72 hours. Hepatitis Panel  Recent Labs  09/27/15 1216  HEPBSAG Negative  HCVAB <0.1  HEPAIGM Negative  HEPBIGM Negative    Studies/Results: No results found.  Scheduled Meds: . apixaban  2.5 mg Oral BID  . aspirin EC  81 mg Oral Daily  . digoxin  0.125 mg Oral Daily  .  lactulose  10 g Oral BID  . metoprolol succinate  37.5 mg Oral BID  . pantoprazole  40 mg Oral Daily  . potassium chloride  20 mEq Oral BID  . potassium chloride  40 mEq Oral Once  . vitamin B-12  1,000 mcg Oral q morning - 10a   Continuous Infusions:  PRN Meds:.nitroGLYCERIN, traMADol   ASSESMENT:   *  Unexplained transaminitis, CK levels c/w rhabdo.  DILI (and statin induced rhabdo) is most likely cause (Crestor started after 4/17 nonSTEMI).  Acute hepatitis serologies negative. Tests in process to r/o AIH. ANA, Smooth muscle Ab, Mitochondrial Ab, CMV IGM:  all pending.  Uncomplicated gallstones, normal biliary tree, normal liver on 6/23 MRCP.  Cholelithiasis on 5/27 ultrasound.  Pt still with fatigue and exhaustion.  Chronically marginal po intake unchanged.   *  Coagulopathy, improved without any Vit K or FFP: this is encouraging.  .   *  CHF: EF 35 to 40%.    *  A fib.  On Eliquis  *  CKD 3.      PLAN   *  Follow LFTs  and periodic coags.      Nancy [name]  09/29/2015, 10:44 AM Pager: [phone]   Attending physician's note   I have taken an interval history, reviewed the chart and examined the patient. I agree with the Advanced Practitioner's note, impression and recommendations. Elevated CK and transaminases suggestive of ischemic liver injury vs drug toxicity (?statin). No evidence of underlying liver disease. Continue supportive care, await labs to exclude autoimmune liver disease but seems to be less likely to be autoimmune hepatitis based on LFT pattern.  Available for any questions  K Denzil Magnuson, MD [phone] Mon-Fri 8a-5p [phone] after 5p, weekends, holidays

## 2015-09-30 DIAGNOSIS — M6282 Rhabdomyolysis: Secondary | ICD-10-CM | POA: Diagnosis present

## 2015-09-30 DIAGNOSIS — K719 Toxic liver disease, unspecified: Secondary | ICD-10-CM

## 2015-09-30 LAB — PROTIME-INR
INR: 1.26 (ref 0.00–1.49)
Prothrombin Time: 16 seconds — ABNORMAL HIGH (ref 11.6–15.2)

## 2015-09-30 LAB — SEDIMENTATION RATE: Sed Rate: 42 mm/hr — ABNORMAL HIGH (ref 0–22)

## 2015-09-30 LAB — ANTI-SMOOTH MUSCLE ANTIBODY, IGG: F-ACTIN AB IGG: 13 U (ref 0–19)

## 2015-09-30 LAB — CK: Total CK: 4415 U/L — ABNORMAL HIGH (ref 38–234)

## 2015-09-30 LAB — MITOCHONDRIAL ANTIBODIES: MITOCHONDRIAL M2 AB, IGG: 6.4 U (ref 0.0–20.0)

## 2015-09-30 MED ORDER — DIGOXIN 125 MCG PO TABS: 0.1250 mg | ORAL_TABLET | Freq: Every day | ORAL | Status: DC

## 2015-09-30 MED ORDER — ASPIRIN 81 MG PO TBEC: 81.0000 mg | DELAYED_RELEASE_TABLET | Freq: Every day | ORAL | Status: AC

## 2015-09-30 NOTE — Consult Note (Signed)
   [facility] CM Inpatient Consult   09/30/2015  Nancy [name] [birth year] [id number]  Update:  Met with patient at the bedside.  Patient was awake and alert.  She states she wants to get up moving.  She is a resident at [facility].  Her son is not currently in the room.  Patient states she will give her son the brochure and contact information. Spoke with the nurse regarding the patient's request to get up. Please contact for questions,  Natividad Brood, RN BSN [facility] Liaison  [phone] business mobile phone Toll free office [phone]

## 2015-09-30 NOTE — Discharge Summary (Addendum)
Physician Discharge Summary  [name] BRA:[id number] DOB: [birth year] DOA: 09/24/2015  PCP: [name], MD  Admit date: 09/24/2015 Discharge date: 09/30/2015   Recommendations for Outpatient Follow-Up:   1. Please follow-up final results of ANA and aldolase, which are still pending at discharge.   Discharge Diagnosis:   Principal Problem:    Fatigue with Elevated LFTs, suspected to be secondary to statin induced myopathy and hepatic inflammation Active Problems:    Acute combined systolic (EF 88%) and grade 3 diastolic heart failure (HCC)    Chronic anticoagulation    SOB (shortness of breath)    Fatigue    Cholelithiasis    PAF (paroxysmal atrial fibrillation) (HCC)    Chronic kidney disease (CKD), stage IV (severe) (HCC)    Systolic and diastolic CHF, acute on chronic (HCC)    AKI (acute kidney injury) (HCC)    Chronic fatigue    Liver injury    Rhabdomyolysis   Discharge disposition:  Assisted living facility: [facility].  Discharge Condition: Stable.  Diet recommendation: Low sodium, heart healthy.    History of Present Illness:   Nancy [name] is an 82 y.o. female with a PMH of recent non-STEMI, pulmonary hypertension, valvular heart disease, PAF and stage III chronic kidney disease who was admitted 09/24/15 with a chief complaint of worsening fatigue. Upon initial evaluation in the ED, patient was found to have elevated LFTs. A right upper quadrant ultrasound showed gallstones without ultrasound findings of gallbladder inflammation. No abnormalities of the liver were noted.   Hospital Course by Problem:   Principal Problem:  Elevated LFTs/cholelithiasis Ultrasound negative for gallbladder thickening. No RUQ tenderness. MRCP showed cholelithiasis but no findings of acute cholecystitis, no biliary ductal dilatation, no choledocholithiasis and a normal appearing liver. Acute hepatitis serologies negative. LFTs continue to rise. Crestor  discontinued. GI assisting with evaluation. Ferritin 200. F/U ANA, anti-smooth muscle antibody, mitochondrial antibodies both negative. IgG 950 (WNL). EBV >600 but IgM < 36, consistent with prior infection.  Active Problems:  Elevated CK/rhabdomyolysis likely from suspected statin induced myopathy Trending down. 13,739--->4415. Suspect secondary to statin toxicity. ESR 42, Aldolase pending. We'll need to rule out myositis. Recommend close follow-up with PCP.   Acute on chronic systolic and diastolic CHF / demand ischemia EF 45% and grade III diastolic dysfunction 04/14/29. CXR on admission showed cardiomegaly and a small right pleural effusion. BNP 1347. Strict I/O, daily weights. Troponins mildly elevated with flat trend. Family refusing medications at this time. Repeat echocardiogram showed an EF of 35-40 percent, diffuse hypokinesis, and LV diastolic dysfunction due to atrial fibrillation. Evaluated by cardiologist 09/27/15. Digoxin started. No symptoms suggestive of active heart failure at discharge.   PAF Continue aspirin and metoprolol. The patient refuses Eliquis. (Son adamant about not resuming this medication and in fact, wants to limit all medications including vitamin supplements etc.)   SOB (shortness of breath) No current complaints of dyspnea.   Fatigue TSH WNL. Suspect from decompensated CHF, MRCP negative. PT/OT evaluations performed, will have PT/OT follow the patient at her facility.    Medical Consultants:    Gastroenterology  Cardiology   Discharge Exam:   Filed Vitals:   09/30/15 0917 09/30/15 1346  BP: 97/59 120/72  Pulse: 82 80  Temp:  97.5 F (36.4 C)  Resp:  18   Filed Vitals:   09/30/15 0534 09/30/15 0535 09/30/15 0917 09/30/15 1346  BP:  126/59 97/59 120/72  Pulse:  82 82 80  Temp:  97.5 F (36.4 C)  97.5 F (36.4 C)  TempSrc:  Oral    Resp:  16  18  Height:      Weight: 56.382 kg (124 lb 4.8 oz)     SpO2:  99%  99%    Gen:   NAD, Fatigued Cardiovascular:  HSIR, No M/R/G Respiratory: Lungs CTAB Gastrointestinal: Abdomen soft, NT/ND with normal active bowel sounds. Extremities: No C/E/C   The results of significant diagnostics from this hospitalization (including imaging, microbiology, ancillary and laboratory) are listed below for reference.     Procedures and Diagnostic Studies:   Dg Chest 2 View  09/24/2015  CLINICAL DATA:  Decreased appetite for 1 month. Chronic shortness of breath. EXAM: CHEST  2 VIEW COMPARISON:  08/30/2015 FINDINGS: Cardiomegaly. No overt edema. No confluent airspace opacities. Small right pleural effusion. No acute bony abnormality. IMPRESSION: Cardiomegaly. Small right pleural effusion. Electronically Signed   By: Rolm Baptise M.D.   On: 09/24/2015 16:32   US Renal  09/25/2015  CLINICAL DATA:  82 year old female with acute kidney insufficiency. Subsequent encounter. EXAM: RENAL / URINARY TRACT ULTRASOUND COMPLETE COMPARISON:  08/30/2015 ultrasound. FINDINGS: Right Kidney: Length: 9 cm. Increased echogenicity consistent with changes of medical renal disease. No hydronephrosis or mass. Left Kidney: Length: 9 cm. Increased echogenicity consistent with changes medical renal disease. No hydronephrosis or mass. Bladder: Appears normal for degree of bladder distention. Right-sided pleural effusion IMPRESSION: Bilateral increased renal echogenicity consistent with changes of medical renal disease. No hydronephrosis. Right-sided pleural effusion. Electronically Signed   By: Genia Del M.D.   On: 09/25/2015 07:51   US Abdomen Limited Ruq  09/25/2015  CLINICAL DATA:  82 year old female with acute kidney insufficiency, elevated liver enzymes, weakness and fatigue. Subsequent encounter. EXAM: US ABDOMEN LIMITED - RIGHT UPPER QUADRANT COMPARISON:  08/30/2015 ultrasound. FINDINGS: Gallbladder: Mobile gallstones measuring up to 8 mm. Phrygian cap incidentally noted. No gallbladder wall thickening or  pericholecystic fluid. Common bile duct: Diameter: 4 mm proximally. Mid to distal aspect not visualized secondary to bowel gas. Liver: No focal lesion identified. Within normal limits in parenchymal echogenicity. Right-sided pleural effusion. IMPRESSION: Gallstones without ultrasound findings of gallbladder inflammation. No liver abnormality noted. Right-sided pleural effusion. Electronically Signed   By: Genia Del M.D.   On: 09/25/2015 07:01     Labs:   Basic Metabolic Panel:  Recent Labs Lab 09/24/15 1928 09/25/15 0120 09/25/15 0855 09/26/15 0703 09/27/15 0543 09/28/15 0507  NA  --  140 139 140 137 138  K  --  3.5 4.1 3.0* 3.0* 4.0  CL  --  104 104 101 101 101  CO2  --  '30 28 27 27 28  ' GLUCOSE  --  133* 160* 107* 95 118*  BUN  --  19 21* 20 26* 28*  CREATININE  --  1.71* 1.74* 1.65* 1.55* 1.53*  CALCIUM  --  8.9 8.9 8.8* 8.8* 9.1  MG 2.2  --   --   --   --   --   PHOS 2.0* 1.3*  --   --   --   --    GFR Estimated Creatinine Clearance: 23.1 mL/min (by C-G formula based on Cr of 1.53). Liver Function Tests:  Recent Labs Lab 09/24/15 1542 09/25/15 0120 09/25/15 0855 09/25/15 1900 09/28/15 0507 09/29/15 0713  AST 367*  --  429* 518* 855* 724*  ALT 145*  --  157* 187* 415* 457*  ALKPHOS 184*  --  185* 176* 169* 171*  BILITOT 1.3*  --  1.5*  1.3* 1.5* 2.3*  PROT 7.3  --  6.9 7.0 6.4* 6.0*  ALBUMIN 2.8* 2.4* 2.6* 2.7* 2.3* 2.3*   No results for input(s): LIPASE, AMYLASE in the last 168 hours.  Recent Labs Lab 09/24/15 1722  AMMONIA 38*   Coagulation profile  Recent Labs Lab 09/30/15 0909  INR 1.26    CBC:  Recent Labs Lab 09/24/15 1542 09/25/15 0645  WBC 10.7* 10.8*  NEUTROABS 8.3*  --   HGB 15.2* 14.8  HCT 46.4* 45.1  MCV 94.1 94.7  PLT 299 299   Cardiac Enzymes:  Recent Labs Lab 09/24/15 1722 09/24/15 1928 09/25/15 0118 09/25/15 0645 09/28/15 1158 09/29/15 0713 09/30/15 0542  CKTOTAL 7045*  --   --   --  13739* 8800* 4415*    TROPONINI  --  0.09* 0.09* 0.09*  --   --   --    Anemia work up  Recent [employer]  09/29/15 0713  FERRITIN 200   Microbiology Recent Results (from the past 240 hour(s))  MRSA PCR Screening     Status: None   Collection Time: 09/25/15  5:17 AM  Result Value Ref Range Status   MRSA by PCR NEGATIVE NEGATIVE Final    Comment:        The GeneXpert MRSA Assay (FDA approved for NASAL specimens only), is one component of a comprehensive MRSA colonization surveillance program. It is not intended to diagnose MRSA infection nor to guide or monitor treatment for MRSA infections.      Discharge Instructions:   Discharge Instructions    (HEART FAILURE PATIENTS) Call MD:  Anytime you have any of the following symptoms: 1) 3 pound weight gain in 24 hours or 5 pounds in 1 week 2) shortness of breath, with or without a dry hacking cough 3) swelling in the hands, feet or stomach 4) if you have to sleep on extra pillows at night in order to breathe.    Complete by:  As directed      Call MD for:  extreme fatigue    Complete by:  As directed      Call MD for:  persistant dizziness or light-headedness    Complete by:  As directed      Diet - low sodium heart healthy    Complete by:  As directed      Face-to-face encounter (required for Medicare/Medicaid patients)    Complete by:  As directed   I RAMA,CHRISTINA certify that this patient is under my care and that I, or a nurse practitioner or physician's assistant working with me, had a face-to-face encounter that meets the physician face-to-face encounter requirements with this patient on 09/30/2015. The encounter with the patient was in whole, or in part for the following medical condition(s) which is the primary reason for home health care (List medical condition): Patient has acute on chronic fatigue related to elevated LFTs and rhabdomyolysis. She will need a CMET drawn every other day until stable. These results should be faxed to her PCP, Dr.  Virgina Jock.  The encounter with the patient was in whole, or in part, for the following medical condition, which is the primary reason for home health care:  Elevated LFTs, fatigue, rhabdomyolysis  I certify that, based on my findings, the following services are medically necessary home health services:  Nursing  Reason for Medically Necessary Home Health Services:   Skilled Nursing- Skilled Assessment/Observation Therapy- Home Adaptation to Facilitate Safety Therapy- Therapeutic Exercises to Increase Strength and Endurance  My clinical findings support the need for the above services:  Unable to leave home safely without assistance and/or assistive device  Further, I certify that my clinical findings support that this patient is homebound due to:  Unable to leave home safely without assistance     Home Health    Complete by:  As directed   To provide the following care/treatments:   PT OT RN       Increase activity slowly    Complete by:  As directed      Walk with assistance    Complete by:  As directed      Walker     Complete by:  As directed             Medication List    STOP taking these medications        apixaban 2.5 MG Tabs tablet  Commonly known as:  ELIQUIS     calcium-vitamin D 500-200 MG-UNIT tablet  Commonly known as:  OSCAL WITH D     CENTRUM SILVER PO     CO Q 10 PO     ergocalciferol 50000 units capsule  Commonly known as:  VITAMIN D2     furosemide 20 MG tablet  Commonly known as:  LASIX     pantoprazole 40 MG tablet  Commonly known as:  PROTONIX     rosuvastatin 40 MG tablet  Commonly known as:  CRESTOR     vitamin B-12 1000 MCG tablet  Commonly known as:  CYANOCOBALAMIN      TAKE these medications        aspirin 81 MG EC tablet  Take 1 tablet (81 mg total) by mouth daily.     digoxin 0.125 MG tablet  Commonly known as:  LANOXIN  Take 1 tablet (0.125 mg total) by mouth daily.     Metoprolol Tartrate 37.5 MG Tabs  Take 37.5 mg by mouth  2 (two) times daily.     nitroGLYCERIN 0.4 MG SL tablet  Commonly known as:  NITROSTAT  Place 1 tablet (0.4 mg total) under the tongue every 5 (five) minutes x 3 doses as needed for chest pain.           Follow-up Information    Follow up with [name], MD. Schedule an appointment as soon as possible for a visit in 2 days.   Specialty:  Internal Medicine   Contact information:   [address] [place] [address] [phone]       Follow up with [facility]   Why:  for physical therapy when you return to [place].   Contact information:   [place] [id number] [phone]       Follow up with [name], MD. Schedule an appointment as soon as possible for a visit in 1 week.   Specialty:  Internal Medicine   Why:  Hospital follow-up   Contact information:   [address] [place] [address] [phone]        Time coordinating discharge: 35 minutes.  Signed:  RAMA,CHRISTINA  Pager [phone] [facility] 09/30/2015, 1:53 PM

## 2015-09-30 NOTE — Progress Notes (Signed)
Daily Rounding Note  09/30/2015, 8:41 AM  LOS: 6 days   SUBJECTIVE:       No complaints.  Still weak.  No dyspnea or pain.    OBJECTIVE:         Vital signs in last 24 hours:    Temp:  [97.3 F (36.3 C)-97.9 F (36.6 C)] 97.5 F (36.4 C) (06/27 0535) Pulse Rate:  [81-97] 82 (06/27 0535) Resp:  [16-18] 16 (06/27 0535) BP: (105-130)/(59-72) 126/59 mmHg (06/27 0535) SpO2:  [98 %-99 %] 99 % (06/27 0535) Weight:  [56.382 kg (124 lb 4.8 oz)] 56.382 kg (124 lb 4.8 oz) (06/27 0534) Last BM Date: 09/28/15 Filed Weights   09/28/15 0555 09/29/15 0354 09/30/15 0534  Weight: 54 kg (119 lb 0.8 oz) 53.3 kg (117 lb 8.1 oz) 56.382 kg (124 lb 4.8 oz)   General: frail, not acutely ill or toxic   Heart: tele with Afib, rate in 90s Chest: clear bil.  No dyspnea.  Slight cough.  Abdomen: soft, NT, ND.  Active BS Extremities: no CCE.  Thin legs and arms Neuro/Psych:  Oriented x 3.  Alert, easily distracted.    Intake/Output from previous day: 06/26 0701 - 06/27 0700 In: 480 [P.O.:480] Out: -   Intake/Output this shift:    Lab Results: No results for input(s): WBC, HGB, HCT, PLT in the last 72 hours. BMET  Recent Labs  09/28/15 0507  NA 138  K 4.0  CL 101  CO2 28  GLUCOSE 118*  BUN 28*  CREATININE 1.53*  CALCIUM 9.1   LFT  Recent Labs  09/28/15 0507 09/29/15 0713  PROT 6.4* 6.0*  ALBUMIN 2.3* 2.3*  AST 855* 724*  ALT 415* 457*  ALKPHOS 169* 171*  BILITOT 1.5* 2.3*  BILIDIR  --  0.7*  IBILI  --  1.6*   PT/INR No results for input(s): LABPROT, INR in the last 72 hours. Hepatitis Panel  Recent Labs  09/27/15 1216  HEPBSAG Negative  HCVAB <0.1  HEPAIGM Negative  HEPBIGM Negative    Studies/Results: No results found.   Scheduled Meds: . aspirin EC  81 mg Oral Daily  . digoxin  0.125 mg Oral Daily  . lactulose  10 g Oral BID  . metoprolol succinate  37.5 mg Oral BID  . potassium chloride  20 mEq  Oral BID  . potassium chloride  40 mEq Oral Once   Continuous Infusions:  PRN Meds:.nitroGLYCERIN, traMADol   ASSESMENT:   * Acute elevated LFTs.  DILI (Crestor started after 4/17 NSTEMI) vs ischemic injury. Acute hepatitis serologies negative. Tests in process to r/o AIH. ANA, Smooth muscle Ab, Mitochondrial Ab still pending.  CMV IGM negative. IgG negative. EBV VCA IgG >600 (positive), IgM <36 (negative).  Ferritin not elevated.  Uncomplicated gallstones, normal biliary tree, normal liver on 6/23 MRCP. Cholelithiasis on 5/27 ultrasound.  Pt still with fatigue and exhaustion. Chronically marginal po intake unchanged.   *  Marked CK elevations.  Rhabdo? Aldolase is pending.   * CHF: EF 35 to 40%.   * A fib. previously on Eliquis but stopped this 7 to 10 days PTA.  Amiodarone on hold due to LFTs.   * CKD 3.AKI impro   PLAN   *  Repeat LFTs, coags 6/28.      Nancy [name]  09/30/2015, 8:41 AM Pager: [phone]     Attending physician's note   I have taken an interval history, reviewed the chart  and examined the patient. I agree with the Advanced Practitioner's note, impression and recommendations.   Nancy Hippo, MD [phone] Mon-Fri 8a-5p [phone] after 5p, weekends, holidays

## 2015-09-30 NOTE — Progress Notes (Signed)
Patient Name: Nancy [name] Date of Encounter: 09/30/2015   SUBJECTIVE  Feels weak. No chest pain or sob.   CURRENT MEDS . aspirin EC  81 mg Oral Daily  . digoxin  0.125 mg Oral Daily  . lactulose  10 g Oral BID  . metoprolol succinate  37.5 mg Oral BID  . potassium chloride  20 mEq Oral BID    OBJECTIVE  Filed Vitals:   09/29/15 2129 09/30/15 0534 09/30/15 0535 09/30/15 0917  BP: 130/68  126/59 97/59  Pulse: 96  82 82  Temp: 97.3 F (36.3 C)  97.5 F (36.4 C)   TempSrc: Oral  Oral   Resp: 18  16   Height:      Weight:  124 lb 4.8 oz (56.382 kg)    SpO2: 98%  99%     Intake/Output Summary (Last 24 hours) at 09/30/15 1249 Last data filed at 09/29/15 1900  Gross per 24 hour  Intake    120 ml  Output      0 ml  Net    120 ml   Filed Weights   09/28/15 0555 09/29/15 0354 09/30/15 0534  Weight: 119 lb 0.8 oz (54 kg) 117 lb 8.1 oz (53.3 kg) 124 lb 4.8 oz (56.382 kg)    PHYSICAL EXAM  General: Pleasant, NAD. Neuro: Alert and oriented X 3. Moves all extremities spontaneously. Psych: Normal affect. HEENT:  Normal  Neck: Supple without bruits or JVD. Lungs:  Resp regular and unlabored, CTA. Heart: irregular  no s3, s4, or murmurs. Abdomen: Soft, non-tender, non-distended, BS + x 4.  Extremities: No clubbing, cyanosis or edema. DP/PT/Radials 2+ and equal bilaterally.  Accessory Clinical Findings  CBC No results for input(s): WBC, NEUTROABS, HGB, HCT, MCV, PLT in the last 72 hours. Basic Metabolic Panel  Recent Labs  09/28/15 0507  NA 138  K 4.0  CL 101  CO2 28  GLUCOSE 118*  BUN 28*  CREATININE 1.53*  CALCIUM 9.1   Liver Function Tests  Recent Labs  09/28/15 0507 09/29/15 0713  AST 855* 724*  ALT 415* 457*  ALKPHOS 169* 171*  BILITOT 1.5* 2.3*  PROT 6.4* 6.0*  ALBUMIN 2.3* 2.3*   No results for input(s): LIPASE, AMYLASE in the last 72 hours. Cardiac Enzymes  Recent Labs  09/28/15 1158 09/29/15 0713 09/30/15 0542  CKTOTAL 42683*  8800* 4415*   TELE  Afib at rate of 80-90s  Radiology/Studies  Dg Chest 2 View  09/24/2015  CLINICAL DATA:  Decreased appetite for 1 month. Chronic shortness of breath. EXAM: CHEST  2 VIEW COMPARISON:  08/30/2015 FINDINGS: Cardiomegaly. No overt edema. No confluent airspace opacities. Small right pleural effusion. No acute bony abnormality. IMPRESSION: Cardiomegaly. Small right pleural effusion. Electronically Signed   By: Rolm Baptise M.D.   On: 09/24/2015 16:32   US Renal  09/25/2015  CLINICAL DATA:  82 year old female with acute kidney insufficiency. Subsequent encounter. EXAM: RENAL / URINARY TRACT ULTRASOUND COMPLETE COMPARISON:  08/30/2015 ultrasound. FINDINGS: Right Kidney: Length: 9 cm. Increased echogenicity consistent with changes of medical renal disease. No hydronephrosis or mass. Left Kidney: Length: 9 cm. Increased echogenicity consistent with changes medical renal disease. No hydronephrosis or mass. Bladder: Appears normal for degree of bladder distention. Right-sided pleural effusion IMPRESSION: Bilateral increased renal echogenicity consistent with changes of medical renal disease. No hydronephrosis. Right-sided pleural effusion. Electronically Signed   By: Genia Del M.D.   On: 09/25/2015 07:51   Mr Abdomen Mrcp Wo Cm  09/26/2015  CLINICAL DATA:  81 year old female inpatient with cholelithiasis and elevated liver function tests. EXAM: MRI ABDOMEN WITHOUT CONTRAST  (INCLUDING MRCP) TECHNIQUE: Multiplanar multisequence MR imaging of the abdomen was performed. Heavily T2-weighted images of the biliary and pancreatic ducts were obtained, and three-dimensional MRCP images were rendered by post processing. COMPARISON:  09/25/2015 right upper quadrant abdominal sonogram. FINDINGS: Lower chest: Small layering right pleural effusion. Hepatobiliary: Normal liver size and configuration. No hepatic steatosis. No liver mass. Subcentimeter gallstone is seen in the gallbladder, with no  gallbladder wall thickening or pericholecystic fluid. Normal variant Phrygian cap. No biliary ductal dilatation. Common bile duct diameter 4 mm. No choledocholithiasis. No evidence of a biliary stricture. Pancreas: No pancreatic mass or duct dilation.  No pancreas divisum. Spleen: Normal size. No mass. Adrenals/Urinary Tract: Normal adrenals. No hydronephrosis. Subcentimeter simple appearing renal cyst in the posterior interpolar left kidney. No additional renal lesions. Stomach/Bowel: Grossly normal stomach. Visualized small and large bowel is normal caliber, with no bowel wall thickening. Vascular/Lymphatic: Normal caliber abdominal aorta. No pathologically enlarged lymph nodes in the abdomen. Other: No abdominal ascites or focal fluid collection. Musculoskeletal: No aggressive appearing focal osseous lesions. IMPRESSION: 1. Cholelithiasis. No unenhanced MRI findings of acute cholecystitis. 2. No biliary ductal dilatation.  No choledocholithiasis. 3. Normal liver. 4. Small layering right pleural effusion. Electronically Signed   By: Ilona Sorrel M.D.   On: 09/26/2015 21:05   Mr 3d Recon At Scanner  09/26/2015  CLINICAL DATA:  82 year old female inpatient with cholelithiasis and elevated liver function tests. EXAM: MRI ABDOMEN WITHOUT CONTRAST  (INCLUDING MRCP) TECHNIQUE: Multiplanar multisequence MR imaging of the abdomen was performed. Heavily T2-weighted images of the biliary and pancreatic ducts were obtained, and three-dimensional MRCP images were rendered by post processing. COMPARISON:  09/25/2015 right upper quadrant abdominal sonogram. FINDINGS: Lower chest: Small layering right pleural effusion. Hepatobiliary: Normal liver size and configuration. No hepatic steatosis. No liver mass. Subcentimeter gallstone is seen in the gallbladder, with no gallbladder wall thickening or pericholecystic fluid. Normal variant Phrygian cap. No biliary ductal dilatation. Common bile duct diameter 4 mm. No  choledocholithiasis. No evidence of a biliary stricture. Pancreas: No pancreatic mass or duct dilation.  No pancreas divisum. Spleen: Normal size. No mass. Adrenals/Urinary Tract: Normal adrenals. No hydronephrosis. Subcentimeter simple appearing renal cyst in the posterior interpolar left kidney. No additional renal lesions. Stomach/Bowel: Grossly normal stomach. Visualized small and large bowel is normal caliber, with no bowel wall thickening. Vascular/Lymphatic: Normal caliber abdominal aorta. No pathologically enlarged lymph nodes in the abdomen. Other: No abdominal ascites or focal fluid collection. Musculoskeletal: No aggressive appearing focal osseous lesions. IMPRESSION: 1. Cholelithiasis. No unenhanced MRI findings of acute cholecystitis. 2. No biliary ductal dilatation.  No choledocholithiasis. 3. Normal liver. 4. Small layering right pleural effusion. Electronically Signed   By: Ilona Sorrel M.D.   On: 09/26/2015 21:05   US Abdomen Limited Ruq  09/25/2015  CLINICAL DATA:  82 year old female with acute kidney insufficiency, elevated liver enzymes, weakness and fatigue. Subsequent encounter. EXAM: US ABDOMEN LIMITED - RIGHT UPPER QUADRANT COMPARISON:  08/30/2015 ultrasound. FINDINGS: Gallbladder: Mobile gallstones measuring up to 8 mm. Phrygian cap incidentally noted. No gallbladder wall thickening or pericholecystic fluid. Common bile duct: Diameter: 4 mm proximally. Mid to distal aspect not visualized secondary to bowel gas. Liver: No focal lesion identified. Within normal limits in parenchymal echogenicity. Right-sided pleural effusion. IMPRESSION: Gallstones without ultrasound findings of gallbladder inflammation. No liver abnormality noted. Right-sided pleural effusion. Electronically Signed   By: Remo Lipps  Jeannine Kitten M.D.   On: 09/25/2015 07:01    ASSESSMENT AND PLAN  1. PAF - Rate stable to 80s on digoxin. Not on amio due to elevated LFTs.  - CHADS2Vasc score of 8, she was on eliquis 2.'5mg'$  bid.  Per Dr. Rockne Menghini the patient has not been taking Eliquis for the past weeks. Today patient states that "I don't know what medication I am taking, My son manages all medications". Her Eliquis is discontinued. She is on ASA '81mg'$  QD. She is at risk for stroke given high score. The patient refused Eliquis initiation.   2. Chronic systolic HF - Echo 10/20/5499 showed LVEF 35-40%, diffuse hypokinesis, cannot eval diastolic function. Moderate MR, mild to moderate RV dysfunction. Mod TR, PASP 38 weight is below her baseline (typically around 130 lbs). - Drop in LVEF from 07/2015 at 45-50%, down to 35-40% this admission. Recent cath 07/2015 with nonobstructive disease. Likely tachycardia mediated cardiomyopathy.  - Continue ASA , BB, digoxin.  - She appears dry by exam, continue to hold diuretics. PRN lasix at discharge. Check Scr.   3. Elevated LFTs -GI following. Improving.   Dispo: Will sign off. Call with questions.   Jarrett Soho PA-C Pager [phone]  Patient seen and examined. Agree with assessment and plan.  ESR 42; CPK improving at 4415 down from 13739; aldolase not available. Cardiac status stable. Continue ASA for antiplatelet benefit.  No CHF on exam. Will sign off; call if cardiologic questions arise.     Troy Sine, MD, [facility] 09/30/2015 1:30 PM

## 2015-09-30 NOTE — Progress Notes (Signed)
NURSING PROGRESS NOTE  Nancy [name] [id number] Discharge Data: 09/30/2015 6:51 PM Attending Provider: Venetia Maxon Rama, MD [id number] Jerilynn Mages, MD     [name] to be D/C'd [facility] per MD order.  Discussed with the patient the After Visit Summary and all questions fully answered. All IV's discontinued with no bleeding noted. All belongings returned to patient for patient to take home.   Last Vital Signs:  Blood pressure 120/72, pulse 80, temperature 97.5 F (36.4 C), temperature source Oral, resp. rate 18, height 5' 6.5" (1.689 m), weight 56.382 kg (124 lb 4.8 oz), SpO2 99 %.  Discharge Medication List   Medication List    STOP taking these medications        apixaban 2.5 MG Tabs tablet  Commonly known as:  ELIQUIS     calcium-vitamin D 500-200 MG-UNIT tablet  Commonly known as:  OSCAL WITH D     CENTRUM SILVER PO     CO Q 10 PO     ergocalciferol 50000 units capsule  Commonly known as:  VITAMIN D2     furosemide 20 MG tablet  Commonly known as:  LASIX     pantoprazole 40 MG tablet  Commonly known as:  PROTONIX     rosuvastatin 40 MG tablet  Commonly known as:  CRESTOR     vitamin B-12 1000 MCG tablet  Commonly known as:  CYANOCOBALAMIN      TAKE these medications        aspirin 81 MG EC tablet  Take 1 tablet (81 mg total) by mouth daily.     digoxin 0.125 MG tablet  Commonly known as:  LANOXIN  Take 1 tablet (0.125 mg total) by mouth daily.     Metoprolol Tartrate 37.5 MG Tabs  Take 37.5 mg by mouth 2 (two) times daily.     nitroGLYCERIN 0.4 MG SL tablet  Commonly known as:  NITROSTAT  Place 1 tablet (0.4 mg total) under the tongue every 5 (five) minutes x 3 doses as needed for chest pain.

## 2015-10-01 DIAGNOSIS — N184 Chronic kidney disease, stage 4 (severe): Secondary | ICD-10-CM | POA: Diagnosis not present

## 2015-10-01 DIAGNOSIS — K8020 Calculus of gallbladder without cholecystitis without obstruction: Secondary | ICD-10-CM | POA: Diagnosis not present

## 2015-10-01 DIAGNOSIS — I272 Other secondary pulmonary hypertension: Secondary | ICD-10-CM | POA: Diagnosis not present

## 2015-10-01 DIAGNOSIS — J449 Chronic obstructive pulmonary disease, unspecified: Secondary | ICD-10-CM | POA: Diagnosis not present

## 2015-10-01 DIAGNOSIS — I480 Paroxysmal atrial fibrillation: Secondary | ICD-10-CM | POA: Diagnosis not present

## 2015-10-01 DIAGNOSIS — I5043 Acute on chronic combined systolic (congestive) and diastolic (congestive) heart failure: Secondary | ICD-10-CM | POA: Diagnosis not present

## 2015-10-01 LAB — FANA STAINING PATTERNS: Homogeneous Pattern: 1:160 {titer} — ABNORMAL HIGH

## 2015-10-01 LAB — ANTINUCLEAR ANTIBODIES, IFA: ANTINUCLEAR ANTIBODIES, IFA: POSITIVE — AB

## 2015-10-01 LAB — ALDOLASE: Aldolase: 86 U/L — ABNORMAL HIGH (ref 3.3–10.3)

## 2015-10-02 DIAGNOSIS — J90 Pleural effusion, not elsewhere classified: Secondary | ICD-10-CM | POA: Diagnosis not present

## 2015-10-02 DIAGNOSIS — M6282 Rhabdomyolysis: Secondary | ICD-10-CM | POA: Diagnosis not present

## 2015-10-02 DIAGNOSIS — R627 Adult failure to thrive: Secondary | ICD-10-CM | POA: Diagnosis not present

## 2015-10-02 DIAGNOSIS — I480 Paroxysmal atrial fibrillation: Secondary | ICD-10-CM | POA: Diagnosis not present

## 2015-10-02 DIAGNOSIS — R7989 Other specified abnormal findings of blood chemistry: Secondary | ICD-10-CM | POA: Diagnosis not present

## 2015-10-02 DIAGNOSIS — R5383 Other fatigue: Secondary | ICD-10-CM | POA: Diagnosis not present

## 2015-10-02 DIAGNOSIS — E46 Unspecified protein-calorie malnutrition: Secondary | ICD-10-CM | POA: Diagnosis not present

## 2015-10-02 DIAGNOSIS — N183 Chronic kidney disease, stage 3 (moderate): Secondary | ICD-10-CM | POA: Diagnosis not present

## 2015-10-14 DIAGNOSIS — I5043 Acute on chronic combined systolic (congestive) and diastolic (congestive) heart failure: Secondary | ICD-10-CM | POA: Diagnosis not present

## 2015-10-14 DIAGNOSIS — N184 Chronic kidney disease, stage 4 (severe): Secondary | ICD-10-CM | POA: Diagnosis not present

## 2015-10-14 DIAGNOSIS — M6282 Rhabdomyolysis: Secondary | ICD-10-CM | POA: Diagnosis not present

## 2015-10-14 DIAGNOSIS — I480 Paroxysmal atrial fibrillation: Secondary | ICD-10-CM | POA: Diagnosis not present

## 2015-10-14 DIAGNOSIS — I509 Heart failure, unspecified: Secondary | ICD-10-CM | POA: Diagnosis not present

## 2015-10-14 DIAGNOSIS — J449 Chronic obstructive pulmonary disease, unspecified: Secondary | ICD-10-CM | POA: Diagnosis not present

## 2015-10-14 DIAGNOSIS — K8020 Calculus of gallbladder without cholecystitis without obstruction: Secondary | ICD-10-CM | POA: Diagnosis not present

## 2015-10-14 DIAGNOSIS — K760 Fatty (change of) liver, not elsewhere classified: Secondary | ICD-10-CM | POA: Diagnosis not present

## 2015-10-14 DIAGNOSIS — I272 Other secondary pulmonary hypertension: Secondary | ICD-10-CM | POA: Diagnosis not present

## 2015-10-15 DIAGNOSIS — I480 Paroxysmal atrial fibrillation: Secondary | ICD-10-CM | POA: Diagnosis not present

## 2015-10-15 DIAGNOSIS — K8020 Calculus of gallbladder without cholecystitis without obstruction: Secondary | ICD-10-CM | POA: Diagnosis not present

## 2015-10-15 DIAGNOSIS — J449 Chronic obstructive pulmonary disease, unspecified: Secondary | ICD-10-CM | POA: Diagnosis not present

## 2015-10-15 DIAGNOSIS — N184 Chronic kidney disease, stage 4 (severe): Secondary | ICD-10-CM | POA: Diagnosis not present

## 2015-10-15 DIAGNOSIS — I272 Other secondary pulmonary hypertension: Secondary | ICD-10-CM | POA: Diagnosis not present

## 2015-10-15 DIAGNOSIS — I5043 Acute on chronic combined systolic (congestive) and diastolic (congestive) heart failure: Secondary | ICD-10-CM | POA: Diagnosis not present

## 2015-10-16 DIAGNOSIS — L821 Other seborrheic keratosis: Secondary | ICD-10-CM | POA: Diagnosis not present

## 2015-10-16 DIAGNOSIS — L570 Actinic keratosis: Secondary | ICD-10-CM | POA: Diagnosis not present

## 2015-10-16 DIAGNOSIS — Z85820 Personal history of malignant melanoma of skin: Secondary | ICD-10-CM | POA: Diagnosis not present

## 2015-10-16 DIAGNOSIS — Z85828 Personal history of other malignant neoplasm of skin: Secondary | ICD-10-CM | POA: Diagnosis not present

## 2015-10-17 DIAGNOSIS — J449 Chronic obstructive pulmonary disease, unspecified: Secondary | ICD-10-CM | POA: Diagnosis not present

## 2015-10-17 DIAGNOSIS — I272 Other secondary pulmonary hypertension: Secondary | ICD-10-CM | POA: Diagnosis not present

## 2015-10-17 DIAGNOSIS — K8020 Calculus of gallbladder without cholecystitis without obstruction: Secondary | ICD-10-CM | POA: Diagnosis not present

## 2015-10-17 DIAGNOSIS — I480 Paroxysmal atrial fibrillation: Secondary | ICD-10-CM | POA: Diagnosis not present

## 2015-10-17 DIAGNOSIS — I5043 Acute on chronic combined systolic (congestive) and diastolic (congestive) heart failure: Secondary | ICD-10-CM | POA: Diagnosis not present

## 2015-10-17 DIAGNOSIS — N184 Chronic kidney disease, stage 4 (severe): Secondary | ICD-10-CM | POA: Diagnosis not present

## 2015-10-21 DIAGNOSIS — I5043 Acute on chronic combined systolic (congestive) and diastolic (congestive) heart failure: Secondary | ICD-10-CM | POA: Diagnosis not present

## 2015-10-21 DIAGNOSIS — I272 Other secondary pulmonary hypertension: Secondary | ICD-10-CM | POA: Diagnosis not present

## 2015-10-21 DIAGNOSIS — J449 Chronic obstructive pulmonary disease, unspecified: Secondary | ICD-10-CM | POA: Diagnosis not present

## 2015-10-21 DIAGNOSIS — N184 Chronic kidney disease, stage 4 (severe): Secondary | ICD-10-CM | POA: Diagnosis not present

## 2015-10-21 DIAGNOSIS — I480 Paroxysmal atrial fibrillation: Secondary | ICD-10-CM | POA: Diagnosis not present

## 2015-10-21 DIAGNOSIS — K8020 Calculus of gallbladder without cholecystitis without obstruction: Secondary | ICD-10-CM | POA: Diagnosis not present

## 2015-10-23 DIAGNOSIS — I480 Paroxysmal atrial fibrillation: Secondary | ICD-10-CM | POA: Diagnosis not present

## 2015-10-23 DIAGNOSIS — J449 Chronic obstructive pulmonary disease, unspecified: Secondary | ICD-10-CM | POA: Diagnosis not present

## 2015-10-23 DIAGNOSIS — K8020 Calculus of gallbladder without cholecystitis without obstruction: Secondary | ICD-10-CM | POA: Diagnosis not present

## 2015-10-23 DIAGNOSIS — I5043 Acute on chronic combined systolic (congestive) and diastolic (congestive) heart failure: Secondary | ICD-10-CM | POA: Diagnosis not present

## 2015-10-23 DIAGNOSIS — N184 Chronic kidney disease, stage 4 (severe): Secondary | ICD-10-CM | POA: Diagnosis not present

## 2015-10-23 DIAGNOSIS — I272 Other secondary pulmonary hypertension: Secondary | ICD-10-CM | POA: Diagnosis not present

## 2015-10-24 DIAGNOSIS — K8020 Calculus of gallbladder without cholecystitis without obstruction: Secondary | ICD-10-CM | POA: Diagnosis not present

## 2015-10-24 DIAGNOSIS — I272 Other secondary pulmonary hypertension: Secondary | ICD-10-CM | POA: Diagnosis not present

## 2015-10-24 DIAGNOSIS — I480 Paroxysmal atrial fibrillation: Secondary | ICD-10-CM | POA: Diagnosis not present

## 2015-10-24 DIAGNOSIS — J449 Chronic obstructive pulmonary disease, unspecified: Secondary | ICD-10-CM | POA: Diagnosis not present

## 2015-10-24 DIAGNOSIS — I5043 Acute on chronic combined systolic (congestive) and diastolic (congestive) heart failure: Secondary | ICD-10-CM | POA: Diagnosis not present

## 2015-10-24 DIAGNOSIS — N184 Chronic kidney disease, stage 4 (severe): Secondary | ICD-10-CM | POA: Diagnosis not present

## 2015-10-27 DIAGNOSIS — I480 Paroxysmal atrial fibrillation: Secondary | ICD-10-CM | POA: Diagnosis not present

## 2015-10-27 DIAGNOSIS — J449 Chronic obstructive pulmonary disease, unspecified: Secondary | ICD-10-CM | POA: Diagnosis not present

## 2015-10-27 DIAGNOSIS — K8020 Calculus of gallbladder without cholecystitis without obstruction: Secondary | ICD-10-CM | POA: Diagnosis not present

## 2015-10-27 DIAGNOSIS — I272 Other secondary pulmonary hypertension: Secondary | ICD-10-CM | POA: Diagnosis not present

## 2015-10-27 DIAGNOSIS — I5043 Acute on chronic combined systolic (congestive) and diastolic (congestive) heart failure: Secondary | ICD-10-CM | POA: Diagnosis not present

## 2015-10-27 DIAGNOSIS — N184 Chronic kidney disease, stage 4 (severe): Secondary | ICD-10-CM | POA: Diagnosis not present

## 2015-10-28 DIAGNOSIS — J449 Chronic obstructive pulmonary disease, unspecified: Secondary | ICD-10-CM | POA: Diagnosis not present

## 2015-10-28 DIAGNOSIS — K8020 Calculus of gallbladder without cholecystitis without obstruction: Secondary | ICD-10-CM | POA: Diagnosis not present

## 2015-10-28 DIAGNOSIS — N184 Chronic kidney disease, stage 4 (severe): Secondary | ICD-10-CM | POA: Diagnosis not present

## 2015-10-28 DIAGNOSIS — I480 Paroxysmal atrial fibrillation: Secondary | ICD-10-CM | POA: Diagnosis not present

## 2015-10-28 DIAGNOSIS — I272 Other secondary pulmonary hypertension: Secondary | ICD-10-CM | POA: Diagnosis not present

## 2015-10-28 DIAGNOSIS — I5043 Acute on chronic combined systolic (congestive) and diastolic (congestive) heart failure: Secondary | ICD-10-CM | POA: Diagnosis not present

## 2015-10-29 DIAGNOSIS — R627 Adult failure to thrive: Secondary | ICD-10-CM | POA: Diagnosis not present

## 2015-10-29 DIAGNOSIS — M6282 Rhabdomyolysis: Secondary | ICD-10-CM | POA: Diagnosis not present

## 2015-10-29 DIAGNOSIS — N183 Chronic kidney disease, stage 3 (moderate): Secondary | ICD-10-CM | POA: Diagnosis not present

## 2015-10-29 DIAGNOSIS — I480 Paroxysmal atrial fibrillation: Secondary | ICD-10-CM | POA: Diagnosis not present

## 2015-10-29 DIAGNOSIS — E46 Unspecified protein-calorie malnutrition: Secondary | ICD-10-CM | POA: Diagnosis not present

## 2015-10-29 DIAGNOSIS — R5383 Other fatigue: Secondary | ICD-10-CM | POA: Diagnosis not present

## 2015-10-29 DIAGNOSIS — J90 Pleural effusion, not elsewhere classified: Secondary | ICD-10-CM | POA: Diagnosis not present

## 2015-10-29 DIAGNOSIS — R7989 Other specified abnormal findings of blood chemistry: Secondary | ICD-10-CM | POA: Diagnosis not present

## 2015-10-30 DIAGNOSIS — I480 Paroxysmal atrial fibrillation: Secondary | ICD-10-CM | POA: Diagnosis not present

## 2015-10-30 DIAGNOSIS — N184 Chronic kidney disease, stage 4 (severe): Secondary | ICD-10-CM | POA: Diagnosis not present

## 2015-10-30 DIAGNOSIS — K8020 Calculus of gallbladder without cholecystitis without obstruction: Secondary | ICD-10-CM | POA: Diagnosis not present

## 2015-10-30 DIAGNOSIS — J449 Chronic obstructive pulmonary disease, unspecified: Secondary | ICD-10-CM | POA: Diagnosis not present

## 2015-10-30 DIAGNOSIS — I5043 Acute on chronic combined systolic (congestive) and diastolic (congestive) heart failure: Secondary | ICD-10-CM | POA: Diagnosis not present

## 2015-10-30 DIAGNOSIS — I272 Other secondary pulmonary hypertension: Secondary | ICD-10-CM | POA: Diagnosis not present

## 2015-11-04 DIAGNOSIS — I5043 Acute on chronic combined systolic (congestive) and diastolic (congestive) heart failure: Secondary | ICD-10-CM | POA: Diagnosis not present

## 2015-11-04 DIAGNOSIS — J449 Chronic obstructive pulmonary disease, unspecified: Secondary | ICD-10-CM | POA: Diagnosis not present

## 2015-11-04 DIAGNOSIS — I480 Paroxysmal atrial fibrillation: Secondary | ICD-10-CM | POA: Diagnosis not present

## 2015-11-04 DIAGNOSIS — I272 Other secondary pulmonary hypertension: Secondary | ICD-10-CM | POA: Diagnosis not present

## 2015-11-04 DIAGNOSIS — K8020 Calculus of gallbladder without cholecystitis without obstruction: Secondary | ICD-10-CM | POA: Diagnosis not present

## 2015-11-04 DIAGNOSIS — N184 Chronic kidney disease, stage 4 (severe): Secondary | ICD-10-CM | POA: Diagnosis not present

## 2015-11-06 DIAGNOSIS — N184 Chronic kidney disease, stage 4 (severe): Secondary | ICD-10-CM | POA: Diagnosis not present

## 2015-11-06 DIAGNOSIS — K8020 Calculus of gallbladder without cholecystitis without obstruction: Secondary | ICD-10-CM | POA: Diagnosis not present

## 2015-11-06 DIAGNOSIS — I480 Paroxysmal atrial fibrillation: Secondary | ICD-10-CM | POA: Diagnosis not present

## 2015-11-06 DIAGNOSIS — J449 Chronic obstructive pulmonary disease, unspecified: Secondary | ICD-10-CM | POA: Diagnosis not present

## 2015-11-06 DIAGNOSIS — I5043 Acute on chronic combined systolic (congestive) and diastolic (congestive) heart failure: Secondary | ICD-10-CM | POA: Diagnosis not present

## 2015-11-06 DIAGNOSIS — I272 Other secondary pulmonary hypertension: Secondary | ICD-10-CM | POA: Diagnosis not present

## 2015-11-11 DIAGNOSIS — I480 Paroxysmal atrial fibrillation: Secondary | ICD-10-CM | POA: Diagnosis not present

## 2015-11-11 DIAGNOSIS — N184 Chronic kidney disease, stage 4 (severe): Secondary | ICD-10-CM | POA: Diagnosis not present

## 2015-11-11 DIAGNOSIS — I272 Other secondary pulmonary hypertension: Secondary | ICD-10-CM | POA: Diagnosis not present

## 2015-11-11 DIAGNOSIS — I5043 Acute on chronic combined systolic (congestive) and diastolic (congestive) heart failure: Secondary | ICD-10-CM | POA: Diagnosis not present

## 2015-11-11 DIAGNOSIS — J449 Chronic obstructive pulmonary disease, unspecified: Secondary | ICD-10-CM | POA: Diagnosis not present

## 2015-11-11 DIAGNOSIS — K8020 Calculus of gallbladder without cholecystitis without obstruction: Secondary | ICD-10-CM | POA: Diagnosis not present

## 2015-11-13 DIAGNOSIS — I5043 Acute on chronic combined systolic (congestive) and diastolic (congestive) heart failure: Secondary | ICD-10-CM | POA: Diagnosis not present

## 2015-11-13 DIAGNOSIS — J449 Chronic obstructive pulmonary disease, unspecified: Secondary | ICD-10-CM | POA: Diagnosis not present

## 2015-11-13 DIAGNOSIS — N184 Chronic kidney disease, stage 4 (severe): Secondary | ICD-10-CM | POA: Diagnosis not present

## 2015-11-13 DIAGNOSIS — I272 Other secondary pulmonary hypertension: Secondary | ICD-10-CM | POA: Diagnosis not present

## 2015-11-13 DIAGNOSIS — I480 Paroxysmal atrial fibrillation: Secondary | ICD-10-CM | POA: Diagnosis not present

## 2015-11-13 DIAGNOSIS — K8020 Calculus of gallbladder without cholecystitis without obstruction: Secondary | ICD-10-CM | POA: Diagnosis not present

## 2015-11-18 DIAGNOSIS — K8020 Calculus of gallbladder without cholecystitis without obstruction: Secondary | ICD-10-CM | POA: Diagnosis not present

## 2015-11-18 DIAGNOSIS — N184 Chronic kidney disease, stage 4 (severe): Secondary | ICD-10-CM | POA: Diagnosis not present

## 2015-11-18 DIAGNOSIS — I5043 Acute on chronic combined systolic (congestive) and diastolic (congestive) heart failure: Secondary | ICD-10-CM | POA: Diagnosis not present

## 2015-11-18 DIAGNOSIS — I480 Paroxysmal atrial fibrillation: Secondary | ICD-10-CM | POA: Diagnosis not present

## 2015-11-18 DIAGNOSIS — J449 Chronic obstructive pulmonary disease, unspecified: Secondary | ICD-10-CM | POA: Diagnosis not present

## 2015-11-18 DIAGNOSIS — I272 Other secondary pulmonary hypertension: Secondary | ICD-10-CM | POA: Diagnosis not present

## 2015-11-21 DIAGNOSIS — K8020 Calculus of gallbladder without cholecystitis without obstruction: Secondary | ICD-10-CM | POA: Diagnosis not present

## 2015-11-21 DIAGNOSIS — I480 Paroxysmal atrial fibrillation: Secondary | ICD-10-CM | POA: Diagnosis not present

## 2015-11-21 DIAGNOSIS — I272 Other secondary pulmonary hypertension: Secondary | ICD-10-CM | POA: Diagnosis not present

## 2015-11-21 DIAGNOSIS — I5043 Acute on chronic combined systolic (congestive) and diastolic (congestive) heart failure: Secondary | ICD-10-CM | POA: Diagnosis not present

## 2015-11-21 DIAGNOSIS — J449 Chronic obstructive pulmonary disease, unspecified: Secondary | ICD-10-CM | POA: Diagnosis not present

## 2015-11-21 DIAGNOSIS — N184 Chronic kidney disease, stage 4 (severe): Secondary | ICD-10-CM | POA: Diagnosis not present

## 2015-11-23 ENCOUNTER — Emergency Department (HOSPITAL_COMMUNITY)
Admission: EM | Admit: 2015-11-23 | Discharge: 2015-11-24 | Disposition: A | Discharge status: Home / Self Care | Payer: Medicare Other | Attending: Emergency Medicine | Admitting: Emergency Medicine

## 2015-11-23 ENCOUNTER — Emergency Department (HOSPITAL_COMMUNITY): Payer: Medicare Other

## 2015-11-23 ENCOUNTER — Encounter (HOSPITAL_COMMUNITY): Payer: Self-pay | Admitting: Emergency Medicine

## 2015-11-23 DIAGNOSIS — N390 Urinary tract infection, site not specified: Secondary | ICD-10-CM | POA: Diagnosis not present

## 2015-11-23 DIAGNOSIS — Y939 Activity, unspecified: Secondary | ICD-10-CM | POA: Insufficient documentation

## 2015-11-23 DIAGNOSIS — J449 Chronic obstructive pulmonary disease, unspecified: Secondary | ICD-10-CM | POA: Diagnosis not present

## 2015-11-23 DIAGNOSIS — M5440 Lumbago with sciatica, unspecified side: Secondary | ICD-10-CM | POA: Diagnosis not present

## 2015-11-23 DIAGNOSIS — Z87891 Personal history of nicotine dependence: Secondary | ICD-10-CM | POA: Diagnosis not present

## 2015-11-23 DIAGNOSIS — W228XXA Striking against or struck by other objects, initial encounter: Secondary | ICD-10-CM | POA: Diagnosis not present

## 2015-11-23 DIAGNOSIS — Y999 Unspecified external cause status: Secondary | ICD-10-CM | POA: Insufficient documentation

## 2015-11-23 DIAGNOSIS — W19XXXA Unspecified fall, initial encounter: Secondary | ICD-10-CM

## 2015-11-23 DIAGNOSIS — S0083XA Contusion of other part of head, initial encounter: Secondary | ICD-10-CM | POA: Diagnosis not present

## 2015-11-23 DIAGNOSIS — I5041 Acute combined systolic (congestive) and diastolic (congestive) heart failure: Secondary | ICD-10-CM | POA: Insufficient documentation

## 2015-11-23 DIAGNOSIS — M542 Cervicalgia: Secondary | ICD-10-CM | POA: Diagnosis not present

## 2015-11-23 DIAGNOSIS — R6889 Other general symptoms and signs: Secondary | ICD-10-CM | POA: Diagnosis not present

## 2015-11-23 DIAGNOSIS — R51 Headache: Secondary | ICD-10-CM | POA: Diagnosis not present

## 2015-11-23 DIAGNOSIS — Z7901 Long term (current) use of anticoagulants: Secondary | ICD-10-CM | POA: Diagnosis not present

## 2015-11-23 DIAGNOSIS — Z7982 Long term (current) use of aspirin: Secondary | ICD-10-CM | POA: Insufficient documentation

## 2015-11-23 DIAGNOSIS — Y9289 Other specified places as the place of occurrence of the external cause: Secondary | ICD-10-CM | POA: Insufficient documentation

## 2015-11-23 DIAGNOSIS — Z85820 Personal history of malignant melanoma of skin: Secondary | ICD-10-CM | POA: Diagnosis not present

## 2015-11-23 DIAGNOSIS — I252 Old myocardial infarction: Secondary | ICD-10-CM | POA: Diagnosis not present

## 2015-11-23 DIAGNOSIS — S299XXA Unspecified injury of thorax, initial encounter: Secondary | ICD-10-CM | POA: Diagnosis not present

## 2015-11-23 DIAGNOSIS — N184 Chronic kidney disease, stage 4 (severe): Secondary | ICD-10-CM | POA: Insufficient documentation

## 2015-11-23 DIAGNOSIS — S0093XA Contusion of unspecified part of head, initial encounter: Secondary | ICD-10-CM | POA: Insufficient documentation

## 2015-11-23 DIAGNOSIS — M546 Pain in thoracic spine: Secondary | ICD-10-CM | POA: Diagnosis not present

## 2015-11-23 DIAGNOSIS — R509 Fever, unspecified: Secondary | ICD-10-CM

## 2015-11-23 DIAGNOSIS — M545 Low back pain: Secondary | ICD-10-CM

## 2015-11-23 DIAGNOSIS — S0990XA Unspecified injury of head, initial encounter: Secondary | ICD-10-CM | POA: Diagnosis present

## 2015-11-23 DIAGNOSIS — R404 Transient alteration of awareness: Secondary | ICD-10-CM | POA: Diagnosis not present

## 2015-11-23 DIAGNOSIS — S3992XA Unspecified injury of lower back, initial encounter: Secondary | ICD-10-CM | POA: Diagnosis not present

## 2015-11-23 DIAGNOSIS — E041 Nontoxic single thyroid nodule: Secondary | ICD-10-CM

## 2015-11-23 DIAGNOSIS — R4182 Altered mental status, unspecified: Secondary | ICD-10-CM | POA: Diagnosis not present

## 2015-11-23 LAB — BASIC METABOLIC PANEL
Anion gap: 12 (ref 5–15)
BUN: 11 mg/dL (ref 6–20)
CALCIUM: 9.7 mg/dL (ref 8.9–10.3)
CO2: 26 mmol/L (ref 22–32)
CREATININE: 1.19 mg/dL — AB (ref 0.44–1.00)
Chloride: 97 mmol/L — ABNORMAL LOW (ref 101–111)
GFR calc non Af Amer: 40 mL/min — ABNORMAL LOW (ref >60)
GFR, EST AFRICAN AMERICAN: 46 mL/min — AB (ref >60)
Glucose, Bld: 118 mg/dL — ABNORMAL HIGH (ref 65–99)
Potassium: 4.1 mmol/L (ref 3.5–5.1)
SODIUM: 135 mmol/L (ref 135–145)

## 2015-11-23 LAB — CBC WITH DIFFERENTIAL/PLATELET
BASOS PCT: 0 %
Basophils Absolute: 0 10*3/uL (ref 0.0–0.1)
EOS ABS: 0 10*3/uL (ref 0.0–0.7)
EOS PCT: 0 %
HCT: 45.3 % (ref 36.0–46.0)
Hemoglobin: 15.4 g/dL — ABNORMAL HIGH (ref 12.0–15.0)
LYMPHS ABS: 0.9 10*3/uL (ref 0.7–4.0)
Lymphocytes Relative: 7 %
MCH: 32 pg (ref 26.0–34.0)
MCHC: 34 g/dL (ref 30.0–36.0)
MCV: 94.2 fL (ref 78.0–100.0)
MONO ABS: 0.7 10*3/uL (ref 0.1–1.0)
MONOS PCT: 5 %
NEUTROS PCT: 88 %
Neutro Abs: 11.9 10*3/uL — ABNORMAL HIGH (ref 1.7–7.7)
PLATELETS: 246 10*3/uL (ref 150–400)
RBC: 4.81 MIL/uL (ref 3.87–5.11)
RDW: 14.9 % (ref 11.5–15.5)
WBC: 13.5 10*3/uL — ABNORMAL HIGH (ref 4.0–10.5)

## 2015-11-23 LAB — URINE MICROSCOPIC-ADD ON: RBC / HPF: NONE SEEN RBC/hpf (ref 0–5)

## 2015-11-23 LAB — URINALYSIS, ROUTINE W REFLEX MICROSCOPIC
BILIRUBIN URINE: NEGATIVE
GLUCOSE, UA: NEGATIVE mg/dL
KETONES UR: 15 mg/dL — AB
NITRITE: NEGATIVE
PH: 7 (ref 5.0–8.0)
Protein, ur: 100 mg/dL — AB
Specific Gravity, Urine: 1.018 (ref 1.005–1.030)

## 2015-11-23 LAB — DIGOXIN LEVEL: DIGOXIN LVL: 0.6 ng/mL — AB (ref 0.8–2.0)

## 2015-11-23 MED ORDER — DEXTROSE 5 % IV SOLN
1.0000 g | Freq: Once | INTRAVENOUS | Status: AC
  Administered 2015-11-24: 1 g via INTRAVENOUS
  Filled 2015-11-23: qty 10

## 2015-11-23 NOTE — ED Triage Notes (Signed)
Per [facility]   Found on the floor. Coming from [employer].   Normally A&O  Not alert now. She knows she fell and that he back hurts. Admits to hitting her head. Ptdoes take eliquis. Unknown downtime. Agitated.

## 2015-11-23 NOTE — ED Notes (Signed)
Pt incontinent prior to I&O cath attempt. Will try again later

## 2015-11-23 NOTE — ED Provider Notes (Addendum)
[facility] DEPT Provider Note   CSN: [id number] Arrival date & time: 11/23/15  2038     History   Chief Complaint Chief Complaint  Patient presents with  . Fall  Level V caveat  HPI Nancy [name] is a 82 y.o. female.  HPI   81 year old female presents today with report that she was found on the floor. She is independent living. She cannot tell me exactly what happened. She does state that she hit her head. She appears somewhat confused from her baseline. I spoke with her son is out of town. He states that she sounds and she is more confused than usual.  Past Medical History:  Diagnosis Date  . CKD (chronic kidney disease), stage III   . Depression   . Dyslipidemia   . GERD (gastroesophageal reflux disease)   . History of scarlet fever   . Melanoma ([place])   . Migraine   . Mitral regurgitation    a. Echo (11/15):  EF 50-55%, Gr 2 DD, MAC, prolapsing P2 segment of post MV leaflet with mod to possibly severe MR, mild LAE, normal RVF, PASP 35 mmHg;  b. Echo 5/16: Mild LVH, EF 55-60%, trivial AI, moderate MR (LVID, ES 32.3 mm), mild BAE, moderate TR, PASP 48 c. Echo 07/2015:EF 45-50%, moderate to severe MR   . NSTEMI (non-ST elevated myocardial infarction) ([place]) 07/2015   a. Troponin peak of 7.17. Cath w/ normal cors, thought to be 2ry to vasospasm  . PAF (paroxysmal atrial fibrillation) ([place])    a. On Coumadin    Patient Active Problem List   Diagnosis Date Noted  . Rhabdomyolysis 09/30/2015  . Drug-induced injury of liver   . Liver injury   . AKI (acute kidney injury) ([place])   . Chronic fatigue   . Elevated LFTs 09/25/2015  . Cholelithiasis 09/25/2015  . PAF (paroxysmal atrial fibrillation) ([place]) 09/25/2015  . Chronic kidney disease (CKD), stage IV (severe) ([place]) 09/25/2015  . Systolic and diastolic CHF, acute on chronic ([place]) 09/25/2015  . SOB (shortness of breath) 09/24/2015  . Fatigue 09/24/2015  . Melena 08/30/2015  . Moderate to severe pulmonary hypertension  ([place]) 08/30/2015  . Acute epigastric pain 08/30/2015  . Elevated transaminase level 08/30/2015  . Acute diastolic heart failure, NYHA class 1 ([place]) 08/30/2015  . Chronic anticoagulation 08/11/2015  . Elevated troponin 07/20/2015  . Recent NSTEMI  07/20/2015  . Chest pain with high risk of acute coronary syndrome 07/20/2015  . Contact dermatitis 12/18/2014  . Encounter for therapeutic drug monitoring 09/05/2014  . Acute combined systolic (EF [id number]) and grade 3 diastolic heart failure ([place]) 03/11/2014  . Mitral regurgitation 02/11/2014  . FATIGUE / MALAISE 11/05/2009  . Atrial fibrillation ([place]) 08/26/2009  . SCARLET FEVER 07/29/2009  . COPD GOLD II  07/29/2009  . GERD 07/29/2009  . DIVERTICULOSIS, COLON 07/29/2009  . COLONIC POLYPS, ADENOMATOUS, HX OF 07/29/2009  . HLD (hyperlipidemia) 07/02/2009  . EXPRESSIVE LANGUAGE DISORDER 07/02/2009  . Memory loss 07/02/2009  . APHASIA 07/02/2009    Past Surgical History:  Procedure Laterality Date  . APPENDECTOMY    . CARDIAC CATHETERIZATION N/A 07/21/2015   Procedure: Left Heart Cath and Coronary Angiography;  Surgeon: Lorretta Harp, MD;  Location: [facility];  Service: Cardiovascular;  Laterality: N/A;  . CATARACT EXTRACTION     bilateral  . CESAREAN SECTION    . SKIN BIOPSY    . TONSILLECTOMY      OB History    No data available  Home Medications    Prior to Admission medications   Medication Sig Start Date End Date Taking? Authorizing Provider  aspirin EC 81 MG EC tablet Take 1 tablet (81 mg total) by mouth daily. 09/30/15   Venetia Maxon Rama, MD  digoxin (LANOXIN) 0.125 MG tablet Take 1 tablet (0.125 mg total) by mouth daily. 09/30/15   Venetia Maxon Rama, MD  metoprolol tartrate 37.5 MG TABS Take 37.5 mg by mouth 2 (two) times daily. 08/31/15   Reyne Dumas, MD  nitroGLYCERIN (NITROSTAT) 0.4 MG SL tablet Place 1 tablet (0.4 mg total) under the tongue every 5 (five) minutes x 3 doses as needed for chest pain.  07/22/15   Erma Heritage, PA    Family History Family History  Problem Relation Age of Onset  . Colon polyps    . Heart attack Neg Hx   . Stroke Neg Hx   . Hypertension Neg Hx     Social History Social History  Substance Use Topics  . Smoking status: Former Smoker    Years: 6.00    Types: Cigarettes    Quit date: 09/09/1967  . Smokeless tobacco: Never Used     Comment: pt reports she smoked socially  . Alcohol use 4.2 oz/week    7 Glasses of wine per week     Allergies   Review of patient's allergies indicates no known allergies.   Review of Systems Review of Systems  All other systems reviewed and are negative.    Physical Exam Updated Vital Signs BP 142/77 (BP Location: Right Arm)   Pulse 89   Temp 100 F (37.8 C) (Rectal)   Resp 20   SpO2 92%   Physical Exam  Constitutional: She appears well-developed and well-nourished.  HENT:  Head: Normocephalic and atraumatic.  Contusion base of skull  Eyes: Conjunctivae and EOM are normal. Pupils are equal, round, and reactive to light.  Neck: Normal range of motion. Neck supple.  Cardiovascular: Normal rate, regular rhythm, normal heart sounds and intact distal pulses.   Pulmonary/Chest: Effort normal and breath sounds normal.  Abdominal: Soft. Bowel sounds are normal.  Musculoskeletal: Normal range of motion.  No ttp over spine  Neurological: She is alert. She has normal reflexes.  Patient is oriented to year but not to place  Skin: Skin is warm and dry. Capillary refill takes less than 2 seconds.  Nursing note and vitals reviewed.    ED Treatments / Results  Labs (all labs ordered are listed, but only abnormal results are displayed) Labs Reviewed  CBC WITH DIFFERENTIAL/PLATELET - Abnormal; Notable for the following:       Result Value   WBC 13.5 (*)    Hemoglobin 15.4 (*)    Neutro Abs 11.9 (*)    All other components within normal limits  BASIC METABOLIC PANEL - Abnormal; Notable for the  following:    Chloride 97 (*)    Glucose, Bld 118 (*)    Creatinine, Ser 1.19 (*)    GFR calc non Af Amer 40 (*)    GFR calc Af Amer 46 (*)    All other components within normal limits  DIGOXIN LEVEL - Abnormal; Notable for the following:    Digoxin Level 0.6 (*)    All other components within normal limits  URINALYSIS, ROUTINE W REFLEX MICROSCOPIC (NOT AT [facility])    EKG  EKG Interpretation  Date/Time:  Sunday November 23 2015 22:22:11 EDT Ventricular Rate:  95 PR Interval:    QRS  Duration: 80 QT Interval:  324 QTC Calculation: 407 R Axis:   46 Text Interpretation:  Atrial fibrillation with premature ventricular or aberrantly conducted complexes ST & T wave abnormality, consider inferolateral ischemia or digitalis effect Abnormal ECG Confirmed by Jayin Derousse MD, Enedelia Martorelli [phone]) on 11/23/2015 11:11:16 PM       Radiology Ct Head Wo Contrast  Result Date: 11/23/2015 CLINICAL DATA:  Fall with loss of consciousness. Left-sided head and neck pain. Difficulty swallowing. EXAM: CT HEAD WITHOUT CONTRAST CT CERVICAL SPINE WITHOUT CONTRAST TECHNIQUE: Multidetector CT imaging of the head and cervical spine was performed following the standard protocol without intravenous contrast. Multiplanar CT image reconstructions of the cervical spine were also generated. COMPARISON:  Head CT 05/30/2015 FINDINGS: CT HEAD FINDINGS Brain: Stable generalized atrophy and chronic small vessel ischemia. No intracranial hemorrhage, mass effect, or midline shift. No hydrocephalus. The basilar cisterns are patent. No evidence of territorial infarct. No intracranial fluid collection. Vascular: No hyperdense vessel or abnormal calcification. Atherosclerosis of skullbase vasculature. Skull: No acute fracture.  Calvarium is intact. Sinuses/Orbits: Bilateral cataract resection. Included paranasal sinuses and mastoid air cells are well aerated. CT CERVICAL SPINE FINDINGS No fracture or acute subluxation. The dens is intact. There are no  jumped or perched facets. Disc space narrowing most prominent at C4-C5 and C5-C6 with endplate spurring. There is multilevel facet arthropathy. Minimal anterolisthesis of C7 on T1 appears degenerative. No prevertebral soft tissue edema. There is biapical pleural parenchymal scarring at the lung apices. No focal soft tissue abnormality to account for difficulty swallowing. Bilateral thyroid nodules, previously assessed with ultrasound. IMPRESSION: 1. No acute intracranial abnormality. Atrophy and chronic small vessel ischemia, stable from CT earlier this year. 2. No acute fracture or subluxation of the cervical spine. Multilevel degenerative disc disease and facet arthropathy. 3. Thyroid nodules, assessed by ultrasound. No specific soft tissue etiology to explain difficulty swallowing. Electronically Signed   By: Jeb Levering M.D.   On: 11/23/2015 22:38   Ct Cervical Spine Wo Contrast  Result Date: 11/23/2015 CLINICAL DATA:  Fall with loss of consciousness. Left-sided head and neck pain. Difficulty swallowing. EXAM: CT HEAD WITHOUT CONTRAST CT CERVICAL SPINE WITHOUT CONTRAST TECHNIQUE: Multidetector CT imaging of the head and cervical spine was performed following the standard protocol without intravenous contrast. Multiplanar CT image reconstructions of the cervical spine were also generated. COMPARISON:  Head CT 05/30/2015 FINDINGS: CT HEAD FINDINGS Brain: Stable generalized atrophy and chronic small vessel ischemia. No intracranial hemorrhage, mass effect, or midline shift. No hydrocephalus. The basilar cisterns are patent. No evidence of territorial infarct. No intracranial fluid collection. Vascular: No hyperdense vessel or abnormal calcification. Atherosclerosis of skullbase vasculature. Skull: No acute fracture.  Calvarium is intact. Sinuses/Orbits: Bilateral cataract resection. Included paranasal sinuses and mastoid air cells are well aerated. CT CERVICAL SPINE FINDINGS No fracture or acute  subluxation. The dens is intact. There are no jumped or perched facets. Disc space narrowing most prominent at C4-C5 and C5-C6 with endplate spurring. There is multilevel facet arthropathy. Minimal anterolisthesis of C7 on T1 appears degenerative. No prevertebral soft tissue edema. There is biapical pleural parenchymal scarring at the lung apices. No focal soft tissue abnormality to account for difficulty swallowing. Bilateral thyroid nodules, previously assessed with ultrasound. IMPRESSION: 1. No acute intracranial abnormality. Atrophy and chronic small vessel ischemia, stable from CT earlier this year. 2. No acute fracture or subluxation of the cervical spine. Multilevel degenerative disc disease and facet arthropathy. 3. Thyroid nodules, assessed by ultrasound. No specific soft tissue  etiology to explain difficulty swallowing. Electronically Signed   By: Jeb Levering M.D.   On: 11/23/2015 22:38    Procedures Procedures (including critical care time)  Medications Ordered in ED Medications - No data to display   Initial Impression / Assessment and Plan / ED Course  I have reviewed the triage vital signs and the nursing notes.  Pertinent labs & imaging results that were available during my care of the patient were reviewed by me and considered in my medical decision making (see chart for details).  Clinical Course    82 y.o. Female with fall at independent living today. Low grade temp, mild confusion here with possible uti. Patient treated with rocephin and will d/c to home if son able to take home and follow up with pmd tomorrow.  Dr.Knott aware.  Patient ambulating but now complains of back pain with ambulation. Still does not have pain with palpation. Plan x-rays lumbar and thoracic spine. Final Clinical Impressions(s) / ED Diagnoses   Final diagnoses:  UTI (lower urinary tract infection)  Fall, initial encounter  Head contusion, initial encounter  Low back pain, unspecified back  pain laterality, with sciatica presence unspecified    New Prescriptions New Prescriptions   No medications on file     Pattricia Boss, MD 11/24/15 0004    Pattricia Boss, MD 11/24/15 [id number]    Pattricia Boss, MD 11/24/15 [id number]    Pattricia Boss, MD 11/24/15 0020

## 2015-11-24 ENCOUNTER — Emergency Department (HOSPITAL_COMMUNITY): Payer: Medicare Other

## 2015-11-24 DIAGNOSIS — S299XXA Unspecified injury of thorax, initial encounter: Secondary | ICD-10-CM | POA: Diagnosis not present

## 2015-11-24 DIAGNOSIS — N390 Urinary tract infection, site not specified: Secondary | ICD-10-CM | POA: Diagnosis not present

## 2015-11-24 DIAGNOSIS — M546 Pain in thoracic spine: Secondary | ICD-10-CM | POA: Diagnosis not present

## 2015-11-24 DIAGNOSIS — S3992XA Unspecified injury of lower back, initial encounter: Secondary | ICD-10-CM | POA: Diagnosis not present

## 2015-11-24 DIAGNOSIS — S0093XA Contusion of unspecified part of head, initial encounter: Secondary | ICD-10-CM | POA: Diagnosis not present

## 2015-11-24 DIAGNOSIS — R6520 Severe sepsis without septic shock: Secondary | ICD-10-CM | POA: Diagnosis not present

## 2015-11-24 DIAGNOSIS — M545 Low back pain: Secondary | ICD-10-CM | POA: Diagnosis not present

## 2015-11-24 MED ORDER — ACETAMINOPHEN 325 MG PO TABS
650.0000 mg | ORAL_TABLET | Freq: Once | ORAL | Status: AC
  Administered 2015-11-24: 650 mg via ORAL
  Filled 2015-11-24: qty 2

## 2015-11-24 MED ORDER — CEPHALEXIN 500 MG PO CAPS: 500.0000 mg | ORAL_CAPSULE | Freq: Two times a day (BID) | ORAL | 0 refills | Status: AC

## 2015-11-24 NOTE — ED Notes (Signed)
Attempted to call son, Denice Paradise to come get patient, left voicemail.

## 2015-11-24 NOTE — Discharge Instructions (Signed)
Nancy [name] should have someone stay with her tonight and recheck with Dr.Russo tomorrow.

## 2015-11-24 NOTE — ED Provider Notes (Signed)
I reviewed the patient's radiology studies which showed an age indeterminant 25% compression fracture of L1. The patient is complaining of pain in the thoracic region so this is more likely a chronic lesion. She ambulated without difficulty at her baseline and is mentating appropriately on my reexamination. Son was called by nursing with plan to complete outpatient antibiotic regimen at home per Dr. Jeanell Sparrow and return with any changes.  The patient's son who lives in [place] is unavailable at this time by phone to the patient has no transportation or family to help her. Nursing is contacting her facility to see if there is a more appropriate level of care than assisted living, we will try to recontact the local brother in the morning, and social work consult was placed for help with disposition management.  Facility was able to take patient under increased supervision. Patient will be discharged.   Leo Grosser, MD 11/24/15 [phone]

## 2015-11-24 NOTE — ED Notes (Signed)
Attempted to comtanct Rich (son) no answer. Voicemail left.

## 2015-11-26 DIAGNOSIS — J449 Chronic obstructive pulmonary disease, unspecified: Secondary | ICD-10-CM | POA: Diagnosis not present

## 2015-11-26 DIAGNOSIS — N184 Chronic kidney disease, stage 4 (severe): Secondary | ICD-10-CM | POA: Diagnosis not present

## 2015-11-26 DIAGNOSIS — I480 Paroxysmal atrial fibrillation: Secondary | ICD-10-CM | POA: Diagnosis not present

## 2015-11-26 DIAGNOSIS — K8020 Calculus of gallbladder without cholecystitis without obstruction: Secondary | ICD-10-CM | POA: Diagnosis not present

## 2015-11-26 DIAGNOSIS — I272 Other secondary pulmonary hypertension: Secondary | ICD-10-CM | POA: Diagnosis not present

## 2015-11-26 DIAGNOSIS — I5043 Acute on chronic combined systolic (congestive) and diastolic (congestive) heart failure: Secondary | ICD-10-CM | POA: Diagnosis not present

## 2015-11-27 DIAGNOSIS — J449 Chronic obstructive pulmonary disease, unspecified: Secondary | ICD-10-CM | POA: Diagnosis not present

## 2015-11-27 DIAGNOSIS — N184 Chronic kidney disease, stage 4 (severe): Secondary | ICD-10-CM | POA: Diagnosis not present

## 2015-11-27 DIAGNOSIS — I5043 Acute on chronic combined systolic (congestive) and diastolic (congestive) heart failure: Secondary | ICD-10-CM | POA: Diagnosis not present

## 2015-11-27 DIAGNOSIS — K8020 Calculus of gallbladder without cholecystitis without obstruction: Secondary | ICD-10-CM | POA: Diagnosis not present

## 2015-11-27 DIAGNOSIS — I272 Other secondary pulmonary hypertension: Secondary | ICD-10-CM | POA: Diagnosis not present

## 2015-11-27 DIAGNOSIS — I480 Paroxysmal atrial fibrillation: Secondary | ICD-10-CM | POA: Diagnosis not present

## 2015-11-30 DIAGNOSIS — I480 Paroxysmal atrial fibrillation: Secondary | ICD-10-CM | POA: Diagnosis not present

## 2015-11-30 DIAGNOSIS — I5042 Chronic combined systolic (congestive) and diastolic (congestive) heart failure: Secondary | ICD-10-CM | POA: Diagnosis not present

## 2015-11-30 DIAGNOSIS — N184 Chronic kidney disease, stage 4 (severe): Secondary | ICD-10-CM | POA: Diagnosis not present

## 2015-11-30 DIAGNOSIS — S32009D Unspecified fracture of unspecified lumbar vertebra, subsequent encounter for fracture with routine healing: Secondary | ICD-10-CM | POA: Diagnosis not present

## 2015-11-30 DIAGNOSIS — S1093XD Contusion of unspecified part of neck, subsequent encounter: Secondary | ICD-10-CM | POA: Diagnosis not present

## 2015-11-30 DIAGNOSIS — S0083XD Contusion of other part of head, subsequent encounter: Secondary | ICD-10-CM | POA: Diagnosis not present

## 2015-12-02 DIAGNOSIS — S0083XD Contusion of other part of head, subsequent encounter: Secondary | ICD-10-CM | POA: Diagnosis not present

## 2015-12-02 DIAGNOSIS — I480 Paroxysmal atrial fibrillation: Secondary | ICD-10-CM | POA: Diagnosis not present

## 2015-12-02 DIAGNOSIS — N184 Chronic kidney disease, stage 4 (severe): Secondary | ICD-10-CM | POA: Diagnosis not present

## 2015-12-02 DIAGNOSIS — I5042 Chronic combined systolic (congestive) and diastolic (congestive) heart failure: Secondary | ICD-10-CM | POA: Diagnosis not present

## 2015-12-02 DIAGNOSIS — Z681 Body mass index (BMI) 19 or less, adult: Secondary | ICD-10-CM | POA: Diagnosis not present

## 2015-12-02 DIAGNOSIS — N183 Chronic kidney disease, stage 3 (moderate): Secondary | ICD-10-CM | POA: Diagnosis not present

## 2015-12-02 DIAGNOSIS — S1093XD Contusion of unspecified part of neck, subsequent encounter: Secondary | ICD-10-CM | POA: Diagnosis not present

## 2015-12-02 DIAGNOSIS — E46 Unspecified protein-calorie malnutrition: Secondary | ICD-10-CM | POA: Diagnosis not present

## 2015-12-02 DIAGNOSIS — S32009D Unspecified fracture of unspecified lumbar vertebra, subsequent encounter for fracture with routine healing: Secondary | ICD-10-CM | POA: Diagnosis not present

## 2015-12-02 DIAGNOSIS — R627 Adult failure to thrive: Secondary | ICD-10-CM | POA: Diagnosis not present

## 2015-12-02 DIAGNOSIS — M5489 Other dorsalgia: Secondary | ICD-10-CM | POA: Diagnosis not present

## 2015-12-02 DIAGNOSIS — R413 Other amnesia: Secondary | ICD-10-CM | POA: Diagnosis not present

## 2015-12-05 DIAGNOSIS — S0083XD Contusion of other part of head, subsequent encounter: Secondary | ICD-10-CM | POA: Diagnosis not present

## 2015-12-05 DIAGNOSIS — I480 Paroxysmal atrial fibrillation: Secondary | ICD-10-CM | POA: Diagnosis not present

## 2015-12-05 DIAGNOSIS — I5042 Chronic combined systolic (congestive) and diastolic (congestive) heart failure: Secondary | ICD-10-CM | POA: Diagnosis not present

## 2015-12-05 DIAGNOSIS — S32009D Unspecified fracture of unspecified lumbar vertebra, subsequent encounter for fracture with routine healing: Secondary | ICD-10-CM | POA: Diagnosis not present

## 2015-12-05 DIAGNOSIS — N184 Chronic kidney disease, stage 4 (severe): Secondary | ICD-10-CM | POA: Diagnosis not present

## 2015-12-05 DIAGNOSIS — S1093XD Contusion of unspecified part of neck, subsequent encounter: Secondary | ICD-10-CM | POA: Diagnosis not present

## 2015-12-08 DIAGNOSIS — N184 Chronic kidney disease, stage 4 (severe): Secondary | ICD-10-CM | POA: Diagnosis not present

## 2015-12-08 DIAGNOSIS — S1093XD Contusion of unspecified part of neck, subsequent encounter: Secondary | ICD-10-CM | POA: Diagnosis not present

## 2015-12-08 DIAGNOSIS — I5042 Chronic combined systolic (congestive) and diastolic (congestive) heart failure: Secondary | ICD-10-CM | POA: Diagnosis not present

## 2015-12-08 DIAGNOSIS — I480 Paroxysmal atrial fibrillation: Secondary | ICD-10-CM | POA: Diagnosis not present

## 2015-12-08 DIAGNOSIS — S32009D Unspecified fracture of unspecified lumbar vertebra, subsequent encounter for fracture with routine healing: Secondary | ICD-10-CM | POA: Diagnosis not present

## 2015-12-08 DIAGNOSIS — S0083XD Contusion of other part of head, subsequent encounter: Secondary | ICD-10-CM | POA: Diagnosis not present

## 2015-12-09 DIAGNOSIS — I480 Paroxysmal atrial fibrillation: Secondary | ICD-10-CM | POA: Diagnosis not present

## 2015-12-09 DIAGNOSIS — S1093XD Contusion of unspecified part of neck, subsequent encounter: Secondary | ICD-10-CM | POA: Diagnosis not present

## 2015-12-09 DIAGNOSIS — S0083XD Contusion of other part of head, subsequent encounter: Secondary | ICD-10-CM | POA: Diagnosis not present

## 2015-12-09 DIAGNOSIS — S32009D Unspecified fracture of unspecified lumbar vertebra, subsequent encounter for fracture with routine healing: Secondary | ICD-10-CM | POA: Diagnosis not present

## 2015-12-09 DIAGNOSIS — N184 Chronic kidney disease, stage 4 (severe): Secondary | ICD-10-CM | POA: Diagnosis not present

## 2015-12-09 DIAGNOSIS — I5042 Chronic combined systolic (congestive) and diastolic (congestive) heart failure: Secondary | ICD-10-CM | POA: Diagnosis not present

## 2015-12-11 DIAGNOSIS — N184 Chronic kidney disease, stage 4 (severe): Secondary | ICD-10-CM | POA: Diagnosis not present

## 2015-12-11 DIAGNOSIS — S0083XD Contusion of other part of head, subsequent encounter: Secondary | ICD-10-CM | POA: Diagnosis not present

## 2015-12-11 DIAGNOSIS — I480 Paroxysmal atrial fibrillation: Secondary | ICD-10-CM | POA: Diagnosis not present

## 2015-12-11 DIAGNOSIS — S1093XD Contusion of unspecified part of neck, subsequent encounter: Secondary | ICD-10-CM | POA: Diagnosis not present

## 2015-12-11 DIAGNOSIS — I5042 Chronic combined systolic (congestive) and diastolic (congestive) heart failure: Secondary | ICD-10-CM | POA: Diagnosis not present

## 2015-12-11 DIAGNOSIS — S32009D Unspecified fracture of unspecified lumbar vertebra, subsequent encounter for fracture with routine healing: Secondary | ICD-10-CM | POA: Diagnosis not present

## 2015-12-17 DIAGNOSIS — M549 Dorsalgia, unspecified: Secondary | ICD-10-CM | POA: Diagnosis not present

## 2015-12-22 DIAGNOSIS — E440 Moderate protein-calorie malnutrition: Secondary | ICD-10-CM | POA: Diagnosis not present

## 2015-12-22 DIAGNOSIS — Z9181 History of falling: Secondary | ICD-10-CM | POA: Diagnosis not present

## 2015-12-22 DIAGNOSIS — F0150 Vascular dementia without behavioral disturbance: Secondary | ICD-10-CM | POA: Diagnosis not present

## 2015-12-22 DIAGNOSIS — I4891 Unspecified atrial fibrillation: Secondary | ICD-10-CM | POA: Diagnosis not present

## 2015-12-23 DIAGNOSIS — I4891 Unspecified atrial fibrillation: Secondary | ICD-10-CM | POA: Diagnosis not present

## 2015-12-23 DIAGNOSIS — F0150 Vascular dementia without behavioral disturbance: Secondary | ICD-10-CM | POA: Diagnosis not present

## 2015-12-23 DIAGNOSIS — Z9181 History of falling: Secondary | ICD-10-CM | POA: Diagnosis not present

## 2015-12-23 DIAGNOSIS — E440 Moderate protein-calorie malnutrition: Secondary | ICD-10-CM | POA: Diagnosis not present

## 2015-12-24 DIAGNOSIS — I4891 Unspecified atrial fibrillation: Secondary | ICD-10-CM | POA: Diagnosis not present

## 2015-12-24 DIAGNOSIS — F0150 Vascular dementia without behavioral disturbance: Secondary | ICD-10-CM | POA: Diagnosis not present

## 2015-12-24 DIAGNOSIS — Z9181 History of falling: Secondary | ICD-10-CM | POA: Diagnosis not present

## 2015-12-24 DIAGNOSIS — E440 Moderate protein-calorie malnutrition: Secondary | ICD-10-CM | POA: Diagnosis not present

## 2015-12-25 DIAGNOSIS — Z9181 History of falling: Secondary | ICD-10-CM | POA: Diagnosis not present

## 2015-12-25 DIAGNOSIS — E440 Moderate protein-calorie malnutrition: Secondary | ICD-10-CM | POA: Diagnosis not present

## 2015-12-25 DIAGNOSIS — I4891 Unspecified atrial fibrillation: Secondary | ICD-10-CM | POA: Diagnosis not present

## 2015-12-25 DIAGNOSIS — F0150 Vascular dementia without behavioral disturbance: Secondary | ICD-10-CM | POA: Diagnosis not present

## 2015-12-26 DIAGNOSIS — I4891 Unspecified atrial fibrillation: Secondary | ICD-10-CM | POA: Diagnosis not present

## 2015-12-26 DIAGNOSIS — F0150 Vascular dementia without behavioral disturbance: Secondary | ICD-10-CM | POA: Diagnosis not present

## 2015-12-26 DIAGNOSIS — E440 Moderate protein-calorie malnutrition: Secondary | ICD-10-CM | POA: Diagnosis not present

## 2015-12-26 DIAGNOSIS — Z9181 History of falling: Secondary | ICD-10-CM | POA: Diagnosis not present

## 2015-12-30 DIAGNOSIS — Z9181 History of falling: Secondary | ICD-10-CM | POA: Diagnosis not present

## 2015-12-30 DIAGNOSIS — F0150 Vascular dementia without behavioral disturbance: Secondary | ICD-10-CM | POA: Diagnosis not present

## 2015-12-30 DIAGNOSIS — E440 Moderate protein-calorie malnutrition: Secondary | ICD-10-CM | POA: Diagnosis not present

## 2015-12-30 DIAGNOSIS — I4891 Unspecified atrial fibrillation: Secondary | ICD-10-CM | POA: Diagnosis not present

## 2015-12-31 DIAGNOSIS — E440 Moderate protein-calorie malnutrition: Secondary | ICD-10-CM | POA: Diagnosis not present

## 2015-12-31 DIAGNOSIS — F0150 Vascular dementia without behavioral disturbance: Secondary | ICD-10-CM | POA: Diagnosis not present

## 2015-12-31 DIAGNOSIS — I4891 Unspecified atrial fibrillation: Secondary | ICD-10-CM | POA: Diagnosis not present

## 2015-12-31 DIAGNOSIS — Z9181 History of falling: Secondary | ICD-10-CM | POA: Diagnosis not present

## 2016-01-02 DIAGNOSIS — F0150 Vascular dementia without behavioral disturbance: Secondary | ICD-10-CM | POA: Diagnosis not present

## 2016-01-02 DIAGNOSIS — Z9181 History of falling: Secondary | ICD-10-CM | POA: Diagnosis not present

## 2016-01-02 DIAGNOSIS — E440 Moderate protein-calorie malnutrition: Secondary | ICD-10-CM | POA: Diagnosis not present

## 2016-01-02 DIAGNOSIS — I4891 Unspecified atrial fibrillation: Secondary | ICD-10-CM | POA: Diagnosis not present

## 2016-01-04 DIAGNOSIS — Z9181 History of falling: Secondary | ICD-10-CM | POA: Diagnosis not present

## 2016-01-04 DIAGNOSIS — F0150 Vascular dementia without behavioral disturbance: Secondary | ICD-10-CM | POA: Diagnosis not present

## 2016-01-04 DIAGNOSIS — I4891 Unspecified atrial fibrillation: Secondary | ICD-10-CM | POA: Diagnosis not present

## 2016-01-04 DIAGNOSIS — E440 Moderate protein-calorie malnutrition: Secondary | ICD-10-CM | POA: Diagnosis not present

## 2016-01-05 DIAGNOSIS — I4891 Unspecified atrial fibrillation: Secondary | ICD-10-CM | POA: Diagnosis not present

## 2016-01-05 DIAGNOSIS — Z9181 History of falling: Secondary | ICD-10-CM | POA: Diagnosis not present

## 2016-01-05 DIAGNOSIS — E440 Moderate protein-calorie malnutrition: Secondary | ICD-10-CM | POA: Diagnosis not present

## 2016-01-05 DIAGNOSIS — F0150 Vascular dementia without behavioral disturbance: Secondary | ICD-10-CM | POA: Diagnosis not present

## 2016-01-06 DIAGNOSIS — E440 Moderate protein-calorie malnutrition: Secondary | ICD-10-CM | POA: Diagnosis not present

## 2016-01-06 DIAGNOSIS — I4891 Unspecified atrial fibrillation: Secondary | ICD-10-CM | POA: Diagnosis not present

## 2016-01-06 DIAGNOSIS — F0150 Vascular dementia without behavioral disturbance: Secondary | ICD-10-CM | POA: Diagnosis not present

## 2016-01-06 DIAGNOSIS — Z9181 History of falling: Secondary | ICD-10-CM | POA: Diagnosis not present

## 2016-01-07 DIAGNOSIS — Z9181 History of falling: Secondary | ICD-10-CM | POA: Diagnosis not present

## 2016-01-07 DIAGNOSIS — E440 Moderate protein-calorie malnutrition: Secondary | ICD-10-CM | POA: Diagnosis not present

## 2016-01-07 DIAGNOSIS — I4891 Unspecified atrial fibrillation: Secondary | ICD-10-CM | POA: Diagnosis not present

## 2016-01-07 DIAGNOSIS — F0150 Vascular dementia without behavioral disturbance: Secondary | ICD-10-CM | POA: Diagnosis not present

## 2016-01-08 DIAGNOSIS — E785 Hyperlipidemia, unspecified: Secondary | ICD-10-CM | POA: Diagnosis not present

## 2016-01-08 DIAGNOSIS — I672 Cerebral atherosclerosis: Secondary | ICD-10-CM | POA: Diagnosis not present

## 2016-01-08 DIAGNOSIS — K219 Gastro-esophageal reflux disease without esophagitis: Secondary | ICD-10-CM | POA: Diagnosis not present

## 2016-01-08 DIAGNOSIS — J449 Chronic obstructive pulmonary disease, unspecified: Secondary | ICD-10-CM | POA: Diagnosis not present

## 2016-01-08 DIAGNOSIS — I509 Heart failure, unspecified: Secondary | ICD-10-CM | POA: Diagnosis not present

## 2016-01-08 DIAGNOSIS — R634 Abnormal weight loss: Secondary | ICD-10-CM | POA: Diagnosis not present

## 2016-01-08 DIAGNOSIS — N183 Chronic kidney disease, stage 3 (moderate): Secondary | ICD-10-CM | POA: Diagnosis not present

## 2016-01-08 DIAGNOSIS — I270 Primary pulmonary hypertension: Secondary | ICD-10-CM | POA: Diagnosis not present

## 2016-01-08 DIAGNOSIS — I080 Rheumatic disorders of both mitral and aortic valves: Secondary | ICD-10-CM | POA: Diagnosis not present

## 2016-01-08 DIAGNOSIS — I25810 Atherosclerosis of coronary artery bypass graft(s) without angina pectoris: Secondary | ICD-10-CM | POA: Diagnosis not present

## 2016-01-08 DIAGNOSIS — F0150 Vascular dementia without behavioral disturbance: Secondary | ICD-10-CM | POA: Diagnosis not present

## 2016-01-08 DIAGNOSIS — I4891 Unspecified atrial fibrillation: Secondary | ICD-10-CM | POA: Diagnosis not present

## 2016-01-12 DIAGNOSIS — I672 Cerebral atherosclerosis: Secondary | ICD-10-CM | POA: Diagnosis not present

## 2016-01-12 DIAGNOSIS — N183 Chronic kidney disease, stage 3 (moderate): Secondary | ICD-10-CM | POA: Diagnosis not present

## 2016-01-12 DIAGNOSIS — I509 Heart failure, unspecified: Secondary | ICD-10-CM | POA: Diagnosis not present

## 2016-01-12 DIAGNOSIS — I25810 Atherosclerosis of coronary artery bypass graft(s) without angina pectoris: Secondary | ICD-10-CM | POA: Diagnosis not present

## 2016-01-12 DIAGNOSIS — F0150 Vascular dementia without behavioral disturbance: Secondary | ICD-10-CM | POA: Diagnosis not present

## 2016-01-12 DIAGNOSIS — I4891 Unspecified atrial fibrillation: Secondary | ICD-10-CM | POA: Diagnosis not present

## 2016-01-13 DIAGNOSIS — I4891 Unspecified atrial fibrillation: Secondary | ICD-10-CM | POA: Diagnosis not present

## 2016-01-13 DIAGNOSIS — N183 Chronic kidney disease, stage 3 (moderate): Secondary | ICD-10-CM | POA: Diagnosis not present

## 2016-01-13 DIAGNOSIS — I25810 Atherosclerosis of coronary artery bypass graft(s) without angina pectoris: Secondary | ICD-10-CM | POA: Diagnosis not present

## 2016-01-13 DIAGNOSIS — I672 Cerebral atherosclerosis: Secondary | ICD-10-CM | POA: Diagnosis not present

## 2016-01-13 DIAGNOSIS — I509 Heart failure, unspecified: Secondary | ICD-10-CM | POA: Diagnosis not present

## 2016-01-13 DIAGNOSIS — F0150 Vascular dementia without behavioral disturbance: Secondary | ICD-10-CM | POA: Diagnosis not present

## 2016-01-15 DIAGNOSIS — I25810 Atherosclerosis of coronary artery bypass graft(s) without angina pectoris: Secondary | ICD-10-CM | POA: Diagnosis not present

## 2016-01-15 DIAGNOSIS — F0150 Vascular dementia without behavioral disturbance: Secondary | ICD-10-CM | POA: Diagnosis not present

## 2016-01-15 DIAGNOSIS — I4891 Unspecified atrial fibrillation: Secondary | ICD-10-CM | POA: Diagnosis not present

## 2016-01-15 DIAGNOSIS — I672 Cerebral atherosclerosis: Secondary | ICD-10-CM | POA: Diagnosis not present

## 2016-01-15 DIAGNOSIS — I509 Heart failure, unspecified: Secondary | ICD-10-CM | POA: Diagnosis not present

## 2016-01-15 DIAGNOSIS — N183 Chronic kidney disease, stage 3 (moderate): Secondary | ICD-10-CM | POA: Diagnosis not present

## 2016-01-16 DIAGNOSIS — N183 Chronic kidney disease, stage 3 (moderate): Secondary | ICD-10-CM | POA: Diagnosis not present

## 2016-01-16 DIAGNOSIS — F0150 Vascular dementia without behavioral disturbance: Secondary | ICD-10-CM | POA: Diagnosis not present

## 2016-01-16 DIAGNOSIS — I25810 Atherosclerosis of coronary artery bypass graft(s) without angina pectoris: Secondary | ICD-10-CM | POA: Diagnosis not present

## 2016-01-16 DIAGNOSIS — I509 Heart failure, unspecified: Secondary | ICD-10-CM | POA: Diagnosis not present

## 2016-01-16 DIAGNOSIS — I672 Cerebral atherosclerosis: Secondary | ICD-10-CM | POA: Diagnosis not present

## 2016-01-16 DIAGNOSIS — I4891 Unspecified atrial fibrillation: Secondary | ICD-10-CM | POA: Diagnosis not present

## 2016-01-20 DIAGNOSIS — I509 Heart failure, unspecified: Secondary | ICD-10-CM | POA: Diagnosis not present

## 2016-01-20 DIAGNOSIS — I672 Cerebral atherosclerosis: Secondary | ICD-10-CM | POA: Diagnosis not present

## 2016-01-20 DIAGNOSIS — N183 Chronic kidney disease, stage 3 (moderate): Secondary | ICD-10-CM | POA: Diagnosis not present

## 2016-01-20 DIAGNOSIS — I4891 Unspecified atrial fibrillation: Secondary | ICD-10-CM | POA: Diagnosis not present

## 2016-01-20 DIAGNOSIS — I25810 Atherosclerosis of coronary artery bypass graft(s) without angina pectoris: Secondary | ICD-10-CM | POA: Diagnosis not present

## 2016-01-20 DIAGNOSIS — F0150 Vascular dementia without behavioral disturbance: Secondary | ICD-10-CM | POA: Diagnosis not present

## 2016-01-24 DIAGNOSIS — I25810 Atherosclerosis of coronary artery bypass graft(s) without angina pectoris: Secondary | ICD-10-CM | POA: Diagnosis not present

## 2016-01-24 DIAGNOSIS — I672 Cerebral atherosclerosis: Secondary | ICD-10-CM | POA: Diagnosis not present

## 2016-01-24 DIAGNOSIS — N183 Chronic kidney disease, stage 3 (moderate): Secondary | ICD-10-CM | POA: Diagnosis not present

## 2016-01-24 DIAGNOSIS — I509 Heart failure, unspecified: Secondary | ICD-10-CM | POA: Diagnosis not present

## 2016-01-24 DIAGNOSIS — I4891 Unspecified atrial fibrillation: Secondary | ICD-10-CM | POA: Diagnosis not present

## 2016-01-24 DIAGNOSIS — F0150 Vascular dementia without behavioral disturbance: Secondary | ICD-10-CM | POA: Diagnosis not present

## 2016-01-28 DIAGNOSIS — I4891 Unspecified atrial fibrillation: Secondary | ICD-10-CM | POA: Diagnosis not present

## 2016-01-28 DIAGNOSIS — N183 Chronic kidney disease, stage 3 (moderate): Secondary | ICD-10-CM | POA: Diagnosis not present

## 2016-01-28 DIAGNOSIS — F0150 Vascular dementia without behavioral disturbance: Secondary | ICD-10-CM | POA: Diagnosis not present

## 2016-01-28 DIAGNOSIS — I25810 Atherosclerosis of coronary artery bypass graft(s) without angina pectoris: Secondary | ICD-10-CM | POA: Diagnosis not present

## 2016-01-28 DIAGNOSIS — I672 Cerebral atherosclerosis: Secondary | ICD-10-CM | POA: Diagnosis not present

## 2016-01-28 DIAGNOSIS — I509 Heart failure, unspecified: Secondary | ICD-10-CM | POA: Diagnosis not present

## 2016-02-04 DIAGNOSIS — N183 Chronic kidney disease, stage 3 (moderate): Secondary | ICD-10-CM | POA: Diagnosis not present

## 2016-02-04 DIAGNOSIS — F0150 Vascular dementia without behavioral disturbance: Secondary | ICD-10-CM | POA: Diagnosis not present

## 2016-02-04 DIAGNOSIS — I672 Cerebral atherosclerosis: Secondary | ICD-10-CM | POA: Diagnosis not present

## 2016-02-04 DIAGNOSIS — I4891 Unspecified atrial fibrillation: Secondary | ICD-10-CM | POA: Diagnosis not present

## 2016-02-04 DIAGNOSIS — R634 Abnormal weight loss: Secondary | ICD-10-CM | POA: Diagnosis not present

## 2016-02-04 DIAGNOSIS — K219 Gastro-esophageal reflux disease without esophagitis: Secondary | ICD-10-CM | POA: Diagnosis not present

## 2016-02-04 DIAGNOSIS — J449 Chronic obstructive pulmonary disease, unspecified: Secondary | ICD-10-CM | POA: Diagnosis not present

## 2016-02-04 DIAGNOSIS — I25810 Atherosclerosis of coronary artery bypass graft(s) without angina pectoris: Secondary | ICD-10-CM | POA: Diagnosis not present

## 2016-02-04 DIAGNOSIS — I509 Heart failure, unspecified: Secondary | ICD-10-CM | POA: Diagnosis not present

## 2016-02-04 DIAGNOSIS — I270 Primary pulmonary hypertension: Secondary | ICD-10-CM | POA: Diagnosis not present

## 2016-02-04 DIAGNOSIS — E785 Hyperlipidemia, unspecified: Secondary | ICD-10-CM | POA: Diagnosis not present

## 2016-02-04 DIAGNOSIS — I080 Rheumatic disorders of both mitral and aortic valves: Secondary | ICD-10-CM | POA: Diagnosis not present

## 2016-02-17 ENCOUNTER — Emergency Department (HOSPITAL_COMMUNITY)

## 2016-02-17 ENCOUNTER — Inpatient Hospital Stay (HOSPITAL_COMMUNITY)
Admission: EM | Admit: 2016-02-17 | Discharge: 2016-02-20 | DRG: 064 | Disposition: A | Discharge status: Hospice | Attending: Internal Medicine | Admitting: Internal Medicine

## 2016-02-17 ENCOUNTER — Encounter (HOSPITAL_COMMUNITY): Payer: Self-pay | Admitting: Emergency Medicine

## 2016-02-17 DIAGNOSIS — G9340 Encephalopathy, unspecified: Secondary | ICD-10-CM | POA: Diagnosis present

## 2016-02-17 DIAGNOSIS — I672 Cerebral atherosclerosis: Secondary | ICD-10-CM | POA: Diagnosis not present

## 2016-02-17 DIAGNOSIS — Z79899 Other long term (current) drug therapy: Secondary | ICD-10-CM | POA: Diagnosis not present

## 2016-02-17 DIAGNOSIS — I480 Paroxysmal atrial fibrillation: Secondary | ICD-10-CM | POA: Diagnosis present

## 2016-02-17 DIAGNOSIS — T466X5A Adverse effect of antihyperlipidemic and antiarteriosclerotic drugs, initial encounter: Secondary | ICD-10-CM | POA: Diagnosis present

## 2016-02-17 DIAGNOSIS — I63432 Cerebral infarction due to embolism of left posterior cerebral artery: Secondary | ICD-10-CM | POA: Diagnosis present

## 2016-02-17 DIAGNOSIS — Z87891 Personal history of nicotine dependence: Secondary | ICD-10-CM | POA: Diagnosis not present

## 2016-02-17 DIAGNOSIS — I252 Old myocardial infarction: Secondary | ICD-10-CM

## 2016-02-17 DIAGNOSIS — I5042 Chronic combined systolic (congestive) and diastolic (congestive) heart failure: Secondary | ICD-10-CM | POA: Diagnosis present

## 2016-02-17 DIAGNOSIS — I639 Cerebral infarction, unspecified: Secondary | ICD-10-CM | POA: Diagnosis present

## 2016-02-17 DIAGNOSIS — F0150 Vascular dementia without behavioral disturbance: Secondary | ICD-10-CM | POA: Diagnosis not present

## 2016-02-17 DIAGNOSIS — I130 Hypertensive heart and chronic kidney disease with heart failure and stage 1 through stage 4 chronic kidney disease, or unspecified chronic kidney disease: Secondary | ICD-10-CM | POA: Diagnosis present

## 2016-02-17 DIAGNOSIS — N184 Chronic kidney disease, stage 4 (severe): Secondary | ICD-10-CM | POA: Diagnosis not present

## 2016-02-17 DIAGNOSIS — M6281 Muscle weakness (generalized): Secondary | ICD-10-CM | POA: Diagnosis not present

## 2016-02-17 DIAGNOSIS — G720 Drug-induced myopathy: Secondary | ICD-10-CM | POA: Diagnosis present

## 2016-02-17 DIAGNOSIS — I4891 Unspecified atrial fibrillation: Secondary | ICD-10-CM | POA: Diagnosis not present

## 2016-02-17 DIAGNOSIS — R414 Neurologic neglect syndrome: Secondary | ICD-10-CM | POA: Diagnosis present

## 2016-02-17 DIAGNOSIS — Z7982 Long term (current) use of aspirin: Secondary | ICD-10-CM

## 2016-02-17 DIAGNOSIS — I63521 Cerebral infarction due to unspecified occlusion or stenosis of right anterior cerebral artery: Secondary | ICD-10-CM

## 2016-02-17 DIAGNOSIS — N183 Chronic kidney disease, stage 3 (moderate): Secondary | ICD-10-CM | POA: Diagnosis not present

## 2016-02-17 DIAGNOSIS — E785 Hyperlipidemia, unspecified: Secondary | ICD-10-CM | POA: Diagnosis not present

## 2016-02-17 DIAGNOSIS — Z681 Body mass index (BMI) 19 or less, adult: Secondary | ICD-10-CM | POA: Diagnosis not present

## 2016-02-17 DIAGNOSIS — F0390 Unspecified dementia without behavioral disturbance: Secondary | ICD-10-CM | POA: Diagnosis not present

## 2016-02-17 DIAGNOSIS — I2510 Atherosclerotic heart disease of native coronary artery without angina pectoris: Secondary | ICD-10-CM | POA: Diagnosis present

## 2016-02-17 DIAGNOSIS — Z515 Encounter for palliative care: Secondary | ICD-10-CM | POA: Diagnosis present

## 2016-02-17 DIAGNOSIS — E43 Unspecified severe protein-calorie malnutrition: Secondary | ICD-10-CM | POA: Diagnosis present

## 2016-02-17 DIAGNOSIS — I10 Essential (primary) hypertension: Secondary | ICD-10-CM | POA: Diagnosis not present

## 2016-02-17 DIAGNOSIS — I25810 Atherosclerosis of coronary artery bypass graft(s) without angina pectoris: Secondary | ICD-10-CM | POA: Diagnosis not present

## 2016-02-17 DIAGNOSIS — I509 Heart failure, unspecified: Secondary | ICD-10-CM | POA: Diagnosis not present

## 2016-02-17 DIAGNOSIS — I6310 Cerebral infarction due to embolism of unspecified precerebral artery: Secondary | ICD-10-CM | POA: Diagnosis not present

## 2016-02-17 DIAGNOSIS — Z66 Do not resuscitate: Secondary | ICD-10-CM | POA: Diagnosis present

## 2016-02-17 DIAGNOSIS — I69359 Hemiplegia and hemiparesis following cerebral infarction affecting unspecified side: Secondary | ICD-10-CM | POA: Diagnosis not present

## 2016-02-17 DIAGNOSIS — M4856XA Collapsed vertebra, not elsewhere classified, lumbar region, initial encounter for fracture: Secondary | ICD-10-CM | POA: Diagnosis present

## 2016-02-17 DIAGNOSIS — F0280 Dementia in other diseases classified elsewhere without behavioral disturbance: Secondary | ICD-10-CM | POA: Diagnosis present

## 2016-02-17 DIAGNOSIS — J449 Chronic obstructive pulmonary disease, unspecified: Secondary | ICD-10-CM | POA: Diagnosis present

## 2016-02-17 DIAGNOSIS — I638 Other cerebral infarction: Secondary | ICD-10-CM | POA: Diagnosis present

## 2016-02-17 DIAGNOSIS — R1312 Dysphagia, oropharyngeal phase: Secondary | ICD-10-CM | POA: Diagnosis not present

## 2016-02-17 DIAGNOSIS — I63512 Cerebral infarction due to unspecified occlusion or stenosis of left middle cerebral artery: Secondary | ICD-10-CM | POA: Diagnosis not present

## 2016-02-17 DIAGNOSIS — I6389 Other cerebral infarction: Secondary | ICD-10-CM

## 2016-02-17 DIAGNOSIS — R278 Other lack of coordination: Secondary | ICD-10-CM | POA: Diagnosis not present

## 2016-02-17 DIAGNOSIS — R402411 Glasgow coma scale score 13-15, in the field [EMT or ambulance]: Secondary | ICD-10-CM | POA: Diagnosis not present

## 2016-02-17 DIAGNOSIS — I5043 Acute on chronic combined systolic (congestive) and diastolic (congestive) heart failure: Secondary | ICD-10-CM | POA: Diagnosis not present

## 2016-02-17 LAB — CBC
HCT: 42 % (ref 36.0–46.0)
HCT: 42.4 % (ref 36.0–46.0)
Hemoglobin: 14.5 g/dL (ref 12.0–15.0)
Hemoglobin: 14.5 g/dL (ref 12.0–15.0)
MCH: 32.2 pg (ref 26.0–34.0)
MCH: 32 pg (ref 26.0–34.0)
MCHC: 34.5 g/dL (ref 30.0–36.0)
MCHC: 34.2 g/dL (ref 30.0–36.0)
MCV: 93.3 fL (ref 78.0–100.0)
MCV: 93.6 fL (ref 78.0–100.0)
PLATELETS: 287 10*3/uL (ref 150–400)
PLATELETS: 274 10*3/uL (ref 150–400)
RBC: 4.5 MIL/uL (ref 3.87–5.11)
RBC: 4.53 MIL/uL (ref 3.87–5.11)
RDW: 13.3 % (ref 11.5–15.5)
RDW: 13.3 % (ref 11.5–15.5)
WBC: 7.8 10*3/uL (ref 4.0–10.5)
WBC: 10.7 10*3/uL — ABNORMAL HIGH (ref 4.0–10.5)

## 2016-02-17 LAB — COMPREHENSIVE METABOLIC PANEL
ALT: 21 U/L (ref 14–54)
AST: 45 U/L — AB (ref 15–41)
Albumin: 3.7 g/dL (ref 3.5–5.0)
Alkaline Phosphatase: 114 U/L (ref 38–126)
Anion gap: 9 (ref 5–15)
BILIRUBIN TOTAL: 2.5 mg/dL — AB (ref 0.3–1.2)
BUN: 13 mg/dL (ref 6–20)
CHLORIDE: 101 mmol/L (ref 101–111)
CO2: 29 mmol/L (ref 22–32)
Calcium: 9.5 mg/dL (ref 8.9–10.3)
Creatinine, Ser: 1.04 mg/dL — ABNORMAL HIGH (ref 0.44–1.00)
GFR, EST AFRICAN AMERICAN: 54 mL/min — AB (ref >60)
GFR, EST NON AFRICAN AMERICAN: 47 mL/min — AB (ref >60)
Glucose, Bld: 107 mg/dL — ABNORMAL HIGH (ref 65–99)
POTASSIUM: 4.8 mmol/L (ref 3.5–5.1)
SODIUM: 139 mmol/L (ref 135–145)
TOTAL PROTEIN: 7.4 g/dL (ref 6.5–8.1)

## 2016-02-17 LAB — I-STAT CG4 LACTIC ACID, ED
LACTIC ACID, VENOUS: 1.21 mmol/L (ref 0.5–1.9)
LACTIC ACID, VENOUS: 1.7 mmol/L (ref 0.5–1.9)

## 2016-02-17 LAB — CREATININE, SERUM
Creatinine, Ser: 0.95 mg/dL (ref 0.44–1.00)
GFR calc Af Amer: >60 mL/min (ref >60)
GFR calc non Af Amer: 52 mL/min — ABNORMAL LOW (ref >60)

## 2016-02-17 LAB — URINALYSIS, ROUTINE W REFLEX MICROSCOPIC
Bilirubin Urine: NEGATIVE
Glucose, UA: NEGATIVE mg/dL
Hgb urine dipstick: NEGATIVE
KETONES UR: NEGATIVE mg/dL
LEUKOCYTES UA: NEGATIVE
NITRITE: NEGATIVE
PROTEIN: NEGATIVE mg/dL
Specific Gravity, Urine: 1.018 (ref 1.005–1.030)
pH: 6.5 (ref 5.0–8.0)

## 2016-02-17 LAB — DIGOXIN LEVEL: Digoxin Level: 0.3 ng/mL — ABNORMAL LOW (ref 0.8–2.0)

## 2016-02-17 LAB — PROTIME-INR
INR: 1.15
PROTHROMBIN TIME: 14.8 s (ref 11.4–15.2)

## 2016-02-17 LAB — CBG MONITORING, ED: GLUCOSE-CAPILLARY: 82 mg/dL (ref 65–99)

## 2016-02-17 MED ORDER — DIGOXIN 125 MCG PO TABS
0.1250 mg | ORAL_TABLET | Freq: Every day | ORAL | Status: DC
  Administered 2016-02-17 – 2016-02-20 (×4): 0.125 mg via ORAL
  Filled 2016-02-17 (×4): qty 1

## 2016-02-17 MED ORDER — SODIUM CHLORIDE 0.9 % IV SOLN
INTRAVENOUS | Status: DC
  Administered 2016-02-17: 1000 mL via INTRAVENOUS
  Administered 2016-02-18 – 2016-02-20 (×3): via INTRAVENOUS

## 2016-02-17 MED ORDER — SODIUM CHLORIDE 0.9 % IV BOLUS (SEPSIS)
1000.0000 mL | Freq: Once | INTRAVENOUS | Status: AC
  Administered 2016-02-17: 1000 mL via INTRAVENOUS

## 2016-02-17 MED ORDER — SODIUM CHLORIDE 0.9 % IV SOLN: INTRAVENOUS | Status: DC

## 2016-02-17 MED ORDER — NITROGLYCERIN 0.4 MG SL SUBL: 0.4000 mg | SUBLINGUAL_TABLET | SUBLINGUAL | Status: DC | PRN

## 2016-02-17 MED ORDER — ENOXAPARIN SODIUM 30 MG/0.3ML ~~LOC~~ SOLN
30.0000 mg | SUBCUTANEOUS | Status: DC
  Administered 2016-02-17 – 2016-02-19 (×3): 30 mg via SUBCUTANEOUS
  Filled 2016-02-17 (×3): qty 0.3

## 2016-02-17 MED ORDER — STROKE: EARLY STAGES OF RECOVERY BOOK
Freq: Once | Status: AC
  Administered 2016-02-17: 1

## 2016-02-17 MED ORDER — METOPROLOL TARTRATE 25 MG PO TABS
37.5000 mg | ORAL_TABLET | Freq: Two times a day (BID) | ORAL | Status: DC
  Administered 2016-02-17 – 2016-02-20 (×6): 37.5 mg via ORAL
  Filled 2016-02-17 (×6): qty 1

## 2016-02-17 MED ORDER — SENNOSIDES-DOCUSATE SODIUM 8.6-50 MG PO TABS: 1.0000 | ORAL_TABLET | Freq: Every evening | ORAL | Status: DC | PRN

## 2016-02-17 MED ORDER — METOPROLOL TARTRATE 37.5 MG PO TABS: 37.5000 mg | ORAL_TABLET | Freq: Two times a day (BID) | ORAL | Status: DC

## 2016-02-17 NOTE — ED Notes (Signed)
Spoke with patient's sons on the telephone. Sons state it is okay to transport patient to [facility].

## 2016-02-17 NOTE — ED Notes (Signed)
ED Provider at bedside. 

## 2016-02-17 NOTE — ED Notes (Signed)
Patient transported to X-ray 

## 2016-02-17 NOTE — ED Notes (Signed)
Delay on vitals pt was in MRI

## 2016-02-17 NOTE — ED Triage Notes (Signed)
Pt sent from assisted living, staff reports patient with altered mental status and weakness. Pt with hx of dementia and lives semi-independently, staff checks on resident 3x per day and states they noticed a difference in patient today and she wasn't "as sharp". Pt also unable to ambulated as usual.

## 2016-02-17 NOTE — ED Notes (Signed)
Bed: WA16 Expected date:  Expected time:  Means of arrival:  Comments: EMS/AMS 

## 2016-02-17 NOTE — Consult Note (Signed)
Neurology Consult Note  Reason for Consultation: Stroke  Requesting provider: Duffy Bruce, MD  CC: None, though patient is demented and encephalopathic  HPI: This is an 38-yo woman with h/o dementia who presented to the ED for evaluation of R--sided weakness. She resides in an assistant living facility in the independent section, with caregivers checking in on her several times daily. Caregivers noted that the patient did not seem as sharp as usual today. When they tried to help her out of bed, she was noted to have right-sided weakness. She was last seen in her normal state of health last night before going to bed.   She was taken to the [facility] ED where exam showed a right gaze preference with 3/5 strength in the R arm and leg, no facial droop. She did not withdraw to pain on the right side. MRI of the brain was obtained and showed patchy areas of acute ischemia in the L cerebral hemisphere. She was transferred to [facility] for further evaluation of acute stroke.   On review of her chart, she has a known history of atrial fibrillation. Notes indicate that the patient and family elected not to put her on oral anticoagulation during her admission to the hospital in June 2017. She is also not taking a statin as she had rhabdomyolysis at that time as well. She was discharged on aspirin.   PMH:  Past Medical History:  Diagnosis Date  . CKD (chronic kidney disease), stage III   . Depression   . Dyslipidemia   . GERD (gastroesophageal reflux disease)   . History of scarlet fever   . Melanoma ([place])   . Migraine   . Mitral regurgitation    a. Echo (11/15):  EF 50-55%, Gr 2 DD, MAC, prolapsing P2 segment of post MV leaflet with mod to possibly severe MR, mild LAE, normal RVF, PASP 35 mmHg;  b. Echo 5/16: Mild LVH, EF 55-60%, trivial AI, moderate MR (LVID, ES 32.3 mm), mild BAE, moderate TR, PASP 48 c. Echo 07/2015:EF 45-50%, moderate to severe MR   . NSTEMI (non-ST elevated myocardial  infarction) ([place]) 07/2015   a. Troponin peak of 7.17. Cath w/ normal cors, thought to be 2ry to vasospasm  . PAF (paroxysmal atrial fibrillation) ([place])    a. On Coumadin    PSH:  Past Surgical History:  Procedure Laterality Date  . APPENDECTOMY    . CARDIAC CATHETERIZATION N/A 07/21/2015   Procedure: Left Heart Cath and Coronary Angiography;  Surgeon: Lorretta Harp, MD;  Location: [facility];  Service: Cardiovascular;  Laterality: N/A;  . CATARACT EXTRACTION     bilateral  . CESAREAN SECTION    . SKIN BIOPSY    . TONSILLECTOMY      Family history: Family History  Problem Relation Age of Onset  . Colon polyps    . Heart attack Neg Hx   . Stroke Neg Hx   . Hypertension Neg Hx     Social history:  Social History   Social History  . Marital status: Married    Spouse name: N/A  . Number of children: N/A  . Years of education: N/A   Occupational History  . Not on file.   Social History Main Topics  . Smoking status: Former Smoker    Years: 6.00    Types: Cigarettes    Quit date: 09/09/1967  . Smokeless tobacco: Never Used     Comment: pt reports she smoked socially  . Alcohol  use 4.2 oz/week    7 Glasses of wine per week  . Drug use: No  . Sexual activity: No   Other Topics Concern  . Not on file   Social History Narrative  . No narrative on file    Current outpatient meds: Current Meds  Medication Sig  . aspirin EC 81 MG EC tablet Take 1 tablet (81 mg total) by mouth daily.  . digoxin (LANOXIN) 0.125 MG tablet Take 1 tablet (0.125 mg total) by mouth daily. (Patient taking differently: Take 0.0625 mg by mouth daily. )  . metoprolol tartrate 37.5 MG TABS Take 37.5 mg by mouth 2 (two) times daily.  . nitroGLYCERIN (NITROSTAT) 0.4 MG SL tablet Place 1 tablet (0.4 mg total) under the tongue every 5 (five) minutes x 3 doses as needed for chest pain.    Current inpatient meds:  No current facility-administered medications for this encounter.      Allergies: No Known Allergies  ROS: As per HPI. A full 14-point review of systems was negative but this is limited by the patient's dementia and encephalopathy.   PE:  BP 180/98 (BP Location: Left Arm)   Pulse 93   Temp 97.8 F (36.6 C) (Oral)   Resp 18   SpO2 95%   General: WDWN elderly Caucasian woman lying in bed. She is mildly restless and confused. She is alert, oriented to self only. She is very hard of hearing. Speech is moderately slurred, no obvious aphasia. Affect is somewhat irritable. She has a left gaze preference but will attend to the right side with prompting.  HEENT: Normocephalic. Neck supple without LAD. MMM, OP clear. Dentition good. Sclerae anicteric. No conjunctival injection.  CV: Irregularly irregular, 2/6 systolic murmur. Carotid pulses full and symmetric, no bruits. Distal pulses 2+ and symmetric.  Lungs: CTAB on anterior exam.  Abdomen: Soft, non-distended, non-tender. Bowel sounds present x4.  Extremities: No C/C/E. Neuro:  CN: Pupils are equal and round. They are symmetrically reactive from 3-->2 mm. She has decreased blink to threat from the right side. EOMI notable for breakup of smooth pursuits without nystagmus. There is mild flattening of the right nasolabial fold but facial movement appears symmetric. She is very hard of hearing. Tongue is midline with normal bulk and mobility.  Motor: Normal bulk for age. She has normal tone with both mitgehen and gegenhalten paratonia. She moves all extremities spontaneously. The left side is 5/5. She has some neglect of the right side but appears to have at least 4/5 strength on that side. No tremor or other abnormal movements.  Sensation: She withdraws the left side better than the right from noxious stimuli.   DTRs: 2+ on the L, 3+ on the R. Toes downgoing on the L, upgoing on the R. Coordination: Deferred as she has difficulty comprehending the instructions. No overt dysmetria with spontaneous movement.   Labs:   Lab Results  Component Value Date   WBC 7.8 02/17/2016   HGB 14.5 02/17/2016   HCT 42.0 02/17/2016   PLT 287 02/17/2016   GLUCOSE 107 (H) 02/17/2016   ALT 21 02/17/2016   AST 45 (H) 02/17/2016   NA 139 02/17/2016   K 4.8 02/17/2016   CL 101 02/17/2016   CREATININE 1.04 (H) 02/17/2016   BUN 13 02/17/2016   CO2 29 02/17/2016   TSH 1.875 09/24/2015   INR 1.15 02/17/2016   Lactic acid 1.70 UA negative Digoxin 0.3  Imaging:  I have personally and independently reviewed the MRI of  the brain without contrast from today. This shows scattered areas of restricted diffusion throughout the left cerebral hemisphere. The largest of these is in the left parietal region with additional areas of ischemia involving the left frontal lobe, medial left temporal lobe, left occipital lobe and the left thalamus. There is moderate diffuse generalized atrophy with prominence of the temporal horns of the lateral ventricles bilaterally. Moderate to severe diffuse chronic small vessel ischemic disease is noted in both cerebral hemispheres.   Other diagnostic studies:  TTE 09/26/15: normal LV size; EF 35-40%; diffuse hypokinesis; myxomatous mitral valve with moderate prolapse and associated moderate MR; mild dilation of the L atrium; moderate dilation of the R atrium; moderate tricuspid and pulmonic regurg  Assessment and Plan:  1. Acute ischemic stroke: This is an acute stroke involving the L MCA and PCA territories. It is most likely embolic in etiology given history of atrial fibrillation. Known risk factors for cerebrovascular disease in this patient include afib not on anticoagulation, hyperlipidemia, CAD, HTN, and age. Additional workup will be ordered to include carotid Dopplers, TTE, fasting lipids, and hemoglobin a1c. Further testing will be determined by results from these initial studies. Recommend continued antiplatelet therapy with aspirin for secondary stroke prevention once cleared to take oral  medications. Will need to discuss possible anticoagulation with patient and family again after this event. She is not on statin after developing rhabdo in June 2017 so will continue to defer these. Allow permissive hypertension in the acute phase, treating only SBP greater than 220 mmHg and/or DBP greater than 110 mmHg. Avoid fever and hyperglycemia as these can extend the infarct. Avoid hypotonic IVF to minimize exacerbation of post-stroke edema. Initiate rehab services. DVT prophylaxis as needed.   2. Right hemiparesis: This is acute, due to stroke. PT/OT/rehab. She reportedly ambulates with a walker at baseline. Unfortunately, her dementia may limit rehabilitation potential but this will become apparent once rehabilitation services to working with her.  3. Right hemi-neglect: This is acute, due to stroke. PT/OT/rehabilitation. Underlying dementia may limit rehabilitation potential.  4. Acute encephalopathy: This is likely due to her acute stroke superimposed on background dementia. Labs are unrevealing. Treatment is supportive. Minimize CNS active medications as much as possible. Opiates, benzos, and anything with strong anticholinergic properties (ie Benadryl) should be avoided. Optimize metabolic status. Her dementia places her at risk for encephalopathy and delirium from all causes and predicts delayed and perhaps incomplete recovery from same. Optimize circadian rhythm with room bright and active by day, dark and quiet with clustering of care to minimize waking by night.   5. Dementia: This is chronic. Given degree of small vessel disease on MRI, this is likely vascular vs mixed (alzheimer's + vascular). Treatment is supportive with management of cerebrovascular risk factors as above.   This was discussed with her caregiver at the bedside. She was given opportunity to ask any questions and these were addressed to her satisfaction.  Thank you for this consultation. The stroke team will assume care  of this patient beginning 02/18/16.

## 2016-02-17 NOTE — ED Notes (Signed)
Patient was last seen normal was last night

## 2016-02-17 NOTE — ED Notes (Signed)
[facility] at bedside to transport patient to [facility].

## 2016-02-17 NOTE — ED Notes (Signed)
Patient transported to MRI 

## 2016-02-17 NOTE — H&P (Signed)
[facility] History and Physical  Nancy [name] [id number] DOB: 05-19-1925 DOA: 02/17/2016  Referring physician: ED PCP: [name], MD  Specialists: Neuro  Chief Complaint:   HPI:   82 y/o ? p afib ~4 supposed to be on elliquis tia GOLD COPD stg II Chr systolic & grade 3 Diastolic HF-EF [id number]   NSTEMI 07/2015 hld CKD stg II/III Compression fracture lower back L1 Prior elevated LFT with cholelithiasis Statin induced myopathy  Mod-dementia-basleine alert oriented x 2 normally, using walker Over this past weekend was able to go to have lunch out and was able to ambulate without issues Had been on hospice secondary to lethargy and change in mental state after receiving pain medication for a recent back fracture in February of this ear and hospice had come out to help under direction of primary care physician Note that after statin induced myopathy multiple medications were discontinued including anticoagulation   Has been at facility since the past year in the independent section Has on and off episodes of confusion  Was seen last normal 11 13 2017 and was found to be confused more so than normal this morning unable to move the right side upper and lower areas with some hemi-neglect  Went to college used to be a Regulatory affairs officer never smoker never drinker Multiple sons and the family  Review of systems is impossible to obtain as patient is confused   Past Medical History:  Diagnosis Date  . CKD (chronic kidney disease), stage III   . Depression   . Dyslipidemia   . GERD (gastroesophageal reflux disease)   . History of scarlet fever   . Melanoma ([place])   . Migraine   . Mitral regurgitation    a. Echo (11/15):  EF 50-55%, Gr 2 DD, MAC, prolapsing P2 segment of post MV leaflet with mod to possibly severe MR, mild LAE, normal RVF, PASP 35 mmHg;  b. Echo 5/16: Mild LVH, EF 55-60%, trivial AI, moderate MR (LVID, ES 32.3 mm), mild BAE, moderate TR, PASP 48 c.  Echo 07/2015:EF 45-50%, moderate to severe MR   . NSTEMI (non-ST elevated myocardial infarction) ([place]) 07/2015   a. Troponin peak of 7.17. Cath w/ normal cors, thought to be 2ry to vasospasm  . PAF (paroxysmal atrial fibrillation) ([place])    a. On Coumadin   Past Surgical History:  Procedure Laterality Date  . APPENDECTOMY    . CARDIAC CATHETERIZATION N/A 07/21/2015   Procedure: Left Heart Cath and Coronary Angiography;  Surgeon: Lorretta Harp, MD;  Location: [facility];  Service: Cardiovascular;  Laterality: N/A;  . CATARACT EXTRACTION     bilateral  . CESAREAN SECTION    . SKIN BIOPSY    . TONSILLECTOMY     Social History:  Social History   Social History Narrative  . No narrative on file    No Known Allergies  Family History  Problem Relation Age of Onset  . Colon polyps    . Heart attack Neg Hx   . Stroke Neg Hx   . Hypertension Neg Hx    Prior to Admission medications   Medication Sig Start Date End Date Taking? Authorizing Provider  aspirin EC 81 MG EC tablet Take 1 tablet (81 mg total) by mouth daily. 09/30/15  Yes Venetia Maxon Rama, MD  digoxin (LANOXIN) 0.125 MG tablet Take 1 tablet (0.125 mg total) by mouth daily. Patient taking differently: Take 0.0625 mg by mouth daily.  09/30/15  Yes Venetia Maxon Rama, MD  metoprolol tartrate 37.5 MG TABS Take 37.5 mg by mouth 2 (two) times daily. 08/31/15  Yes Reyne Dumas, MD  nitroGLYCERIN (NITROSTAT) 0.4 MG SL tablet Place 1 tablet (0.4 mg total) under the tongue every 5 (five) minutes x 3 doses as needed for chest pain. 07/22/15  Yes Erma Heritage, [place]   Physical Exam: Vitals:   02/17/16 1110 02/17/16 1322 02/17/16 1345  BP: (!) 172/112 179/81   Pulse: 80 81 84  Resp: 16 16 17   Temp: 97.8 F (36.6 C)    TempSrc: Oral    SpO2: 97% 98% 96%    EOMI NCAT no pallor no icterus mouth is slightly twisted downwards on the right side external ocular movements are difficult to ascertain I cannot look at the back of  her throat as she does not understand commands No JVD No bruit S1 and S2 tachycardic low 100s chest is clinically clear Abdomen soft nontender no rebound No lower extremity edema Power is 3/5 2/5 in the right upper and lower extremity and she is confused and unable to give me much of a response Risk Alexis R risk brachioradialis and in the patella  Labs on Admission:  Basic Metabolic Panel:  Recent Labs Lab 02/17/16 1118  NA 139  K 4.8  CL 101  CO2 29  GLUCOSE 107*  BUN 13  CREATININE 1.04*  CALCIUM 9.5   Liver Function Tests:  Recent Labs Lab 02/17/16 1118  AST 45*  ALT 21  ALKPHOS 114  BILITOT 2.5*  PROT 7.4  ALBUMIN 3.7   No results for input(s): LIPASE, AMYLASE in the last 168 hours. No results for input(s): AMMONIA in the last 168 hours. CBC:  Recent Labs Lab 02/17/16 1118  WBC 7.8  HGB 14.5  HCT 42.0  MCV 93.3  PLT 287   Cardiac Enzymes: No results for input(s): CKTOTAL, CKMB, CKMBINDEX, TROPONINI in the last 168 hours.  BNP (last 3 results)  Recent Labs  08/30/15 1020 09/24/15 1600 09/28/15 1155  BNP 1,433.0* 1,347.3* 787.3*    ProBNP (last 3 results) No results for input(s): PROBNP in the last 8760 hours.  CBG:  Recent Labs Lab 02/17/16 1128  GLUCAP 82    Radiological Exams on Admission: Dg Chest 2 View  Result Date: 02/17/2016 CLINICAL DATA:  Right-sided weakness. EXAM: CHEST  2 VIEW COMPARISON:  Chest x-ray 11/23/2015, lumbar spine 11/24/2015 FINDINGS: Mild hyperinflation. Negative for pneumonia. Negative for heart failure or effusion. Severe compression fracture L1 has progressed since the prior study of 11/24/2015. IMPRESSION: Pulmonary hyperinflation.  No acute cardiopulmonary abnormality Severe compression fracture L1 with significant progression since 11/24/2015 Electronically Signed   By: Franchot Gallo M.D.   On: 02/17/2016 12:45   Ct Head Wo Contrast  Result Date: 02/17/2016 CLINICAL DATA:  Altered mental status.  EXAM: CT HEAD WITHOUT CONTRAST TECHNIQUE: Contiguous axial images were obtained from the base of the skull through the vertex without intravenous contrast. COMPARISON:  CT scan of November 23, 2015. FINDINGS: Brain: Mild diffuse cortical atrophy is noted. Mild chronic ischemic white matter disease is noted. No mass effect or midline shift is noted. Ventricular size is within normal limits. There is no evidence of mass lesion, hemorrhage or acute infarction. Vascular: Atherosclerosis of carotid siphons is noted. Skull: Bony calvarium appears intact. Sinuses/Orbits: Visualized paranasal sinuses appear normal. Other: None. IMPRESSION: Mild diffuse cortical atrophy. Mild chronic ischemic white matter disease. No acute intracranial abnormality seen. Electronically Signed   By: Marijo Conception, M.D.  On: 02/17/2016 12:02   Mr Brain Wo Contrast  Result Date: 02/17/2016 CLINICAL DATA:  Right-sided weakness EXAM: MRI HEAD WITHOUT CONTRAST TECHNIQUE: Multiplanar, multiecho pulse sequences of the brain and surrounding structures were obtained without intravenous contrast. COMPARISON:  Head CT from earlier today.  Brain MRI 08/18/2015 FINDINGS: Brain: Restricted diffusion in the left PCA territory involving the inferior and parasagittal occipital lobe, periventricular white matter, and posterior left thalamus. 1 cm or smaller DWI hyperintense foci in the parasagittal left frontal lobe and posterior left frontal white matter, 3:34 and 3:36, are not convincing when correlated with ADC map and could be related to motion artifact. No evidence of superimposed hemorrhage. There is generalized atrophy with moderate mesial temporal volume loss, similar to prior. Chronic microvascular disease with ischemic gliosis confluent the deep cerebral white matter. Prominent dilated perivascular spaces in the deep gray nuclei. Vascular: Grossly preserved flow voids. Skull and upper cervical spine: No focal lesion identified. Sinuses/Orbits:  Mild mucosal thickening in the left sphenoid sinus with retained secretions. IMPRESSION: 1. Patchy acute infarct in the left PCA territory, involving the occipital lobe and thalamus. 2. Cannot exclude small foci acute infarction in the left MCA and ACA territories, certainty limited by extensive motion artifact. 3. Atrophy including mesial temporal lobes. Moderate chronic microvascular disease. Electronically Signed   By: Monte Fantasia M.D.   On: 02/17/2016 13:19    EKG: Independently reviewed. Atrial fibrillation rate 100 QRS axis 60 ST-T wave depressions across V4 through 6 which are not new compared to EKG in August 2017  Assessment/Plan  Stroke-MRI consistent with patchy infarct left PCA Will need discussion with family/patient regarding anticoagulation >aspirin and ideally should the on novel oral anticoagulant We will obtain echo and complete stroke workup Will probably need skilled facility placement  Paroxysmal atrial fibrillation [place] score >4 Probably needs to be on novel oral anticoagulant Family is made a conscious decision in the past to not use this citing medication side effects which will need to be discussed again in detail Continue digoxin 0.125 g daily as well as metoprolol 37.5 twice a day  Goals COPD stage II-not currently on any medications Monitor  Chronic compensated systolic and grade 2 diastolic dysfunction based on echo 09/2015 Continue metoprolol Might benefit from ACE inhibitor  Stage I chronic kidney disease Monitor labs a.m.  Compression fracture lower back L1 We'll probably need pain management with ibuprofen and other medications    DNR Transfer [facility] Inpatient pending [email or web address] [place] [facility] Pager [phone]  If 7PM-7AM, please contact night-coverage [email or web address] Password [email or web address] 02/17/2016, 1:57 PM

## 2016-02-17 NOTE — ED Provider Notes (Signed)
[facility] DEPT Provider Note   CSN: [id number] Arrival date & time: 02/17/16  1101     History   Chief Complaint Chief Complaint  Patient presents with  . Altered Mental Status    HPI Nancy [name] is a 82 y.o. female.  HPI 82 year old female with past medical history of mild to moderate dementia who presents with altered mental status. Per report from nursing facility, the patient is alert and oriented at baseline to person and place but not time. She is normally ambulatory with a walker. She was normal last night upon going to bed. Upon awakening this morning, patient was noted to have right sided arm and leg weakness. This is new for her. Unknown when this began overnight. EMS was subsequently called. On EMS assessment, patient confused, disoriented but hemodynamically stable.  Level 5 caveat invoked as remainder of history, ROS, and physical exam limited due to patient's AMS.   Past Medical History:  Diagnosis Date  . CKD (chronic kidney disease), stage III   . Depression   . Dyslipidemia   . GERD (gastroesophageal reflux disease)   . History of scarlet fever   . Melanoma ([place])   . Migraine   . Mitral regurgitation    a. Echo (11/15):  EF 50-55%, Gr 2 DD, MAC, prolapsing P2 segment of post MV leaflet with mod to possibly severe MR, mild LAE, normal RVF, PASP 35 mmHg;  b. Echo 5/16: Mild LVH, EF 55-60%, trivial AI, moderate MR (LVID, ES 32.3 mm), mild BAE, moderate TR, PASP 48 c. Echo 07/2015:EF 45-50%, moderate to severe MR   . NSTEMI (non-ST elevated myocardial infarction) ([place]) 07/2015   a. Troponin peak of 7.17. Cath w/ normal cors, thought to be 2ry to vasospasm  . PAF (paroxysmal atrial fibrillation) ([place])    a. On Coumadin    Patient Active Problem List   Diagnosis Date Noted  . CVA (cerebral vascular accident) ([place]) 02/17/2016  . Stroke (cerebrum) ([place]) 02/17/2016  . Rhabdomyolysis 09/30/2015  . Drug-induced injury of liver   . Liver injury   . AKI  (acute kidney injury) ([place])   . Chronic fatigue   . Elevated LFTs 09/25/2015  . Cholelithiasis 09/25/2015  . PAF (paroxysmal atrial fibrillation) ([place]) 09/25/2015  . Chronic kidney disease (CKD), stage IV (severe) ([place]) 09/25/2015  . Systolic and diastolic CHF, acute on chronic ([place]) 09/25/2015  . SOB (shortness of breath) 09/24/2015  . Fatigue 09/24/2015  . Melena 08/30/2015  . Moderate to severe pulmonary hypertension 08/30/2015  . Acute epigastric pain 08/30/2015  . Elevated transaminase level 08/30/2015  . Acute diastolic heart failure, NYHA class 1 ([place]) 08/30/2015  . Chronic anticoagulation 08/11/2015  . Elevated troponin 07/20/2015  . Recent NSTEMI  07/20/2015  . Chest pain with high risk of acute coronary syndrome 07/20/2015  . Contact dermatitis 12/18/2014  . Encounter for therapeutic drug monitoring 09/05/2014  . Acute combined systolic (EF [id number]) and grade 3 diastolic heart failure ([place]) 03/11/2014  . Mitral regurgitation 02/11/2014  . FATIGUE / MALAISE 11/05/2009  . Atrial fibrillation ([place]) 08/26/2009  . SCARLET FEVER 07/29/2009  . COPD GOLD II  07/29/2009  . GERD 07/29/2009  . DIVERTICULOSIS, COLON 07/29/2009  . COLONIC POLYPS, ADENOMATOUS, HX OF 07/29/2009  . HLD (hyperlipidemia) 07/02/2009  . EXPRESSIVE LANGUAGE DISORDER 07/02/2009  . Memory loss 07/02/2009  . APHASIA 07/02/2009    Past Surgical History:  Procedure Laterality Date  . APPENDECTOMY    . CARDIAC CATHETERIZATION N/A 07/21/2015   Procedure: Left  Heart Cath and Coronary Angiography;  Surgeon: Lorretta Harp, MD;  Location: [facility];  Service: Cardiovascular;  Laterality: N/A;  . CATARACT EXTRACTION     bilateral  . CESAREAN SECTION    . SKIN BIOPSY    . TONSILLECTOMY      OB History    No data available       Home Medications    Prior to Admission medications   Medication Sig Start Date End Date Taking? Authorizing Provider  aspirin EC 81 MG EC tablet Take 1 tablet (81 mg  total) by mouth daily. 09/30/15  Yes Venetia Maxon Rama, MD  digoxin (LANOXIN) 0.125 MG tablet Take 1 tablet (0.125 mg total) by mouth daily. Patient taking differently: Take 0.0625 mg by mouth daily.  09/30/15  Yes Christina P Rama, MD  metoprolol tartrate 37.5 MG TABS Take 37.5 mg by mouth 2 (two) times daily. 08/31/15  Yes Reyne Dumas, MD  nitroGLYCERIN (NITROSTAT) 0.4 MG SL tablet Place 1 tablet (0.4 mg total) under the tongue every 5 (five) minutes x 3 doses as needed for chest pain. 07/22/15  Yes Erma Heritage, PA    Family History Family History  Problem Relation Age of Onset  . Colon polyps    . Heart attack Neg Hx   . Stroke Neg Hx   . Hypertension Neg Hx     Social History Social History  Substance Use Topics  . Smoking status: Former Smoker    Years: 6.00    Types: Cigarettes    Quit date: 09/09/1967  . Smokeless tobacco: Never Used     Comment: pt reports she smoked socially  . Alcohol use 4.2 oz/week    7 Glasses of wine per week     Allergies   Patient has no known allergies.   Review of Systems Review of Systems  Unable to perform ROS: Mental status change  Neurological: Positive for weakness.     Physical Exam Updated Vital Signs BP 180/98 (BP Location: Left Arm)   Pulse 93   Temp 97.8 F (36.6 C) (Oral)   Resp 18   SpO2 95%   Physical Exam  Constitutional: She appears well-developed and well-nourished. No distress.  HENT:  Head: Normocephalic and atraumatic.  Eyes: Conjunctivae are normal.  Neck: Neck supple.  Cardiovascular: Normal rate, regular rhythm and normal heart sounds.  Exam reveals no friction rub.   No murmur heard. Pulmonary/Chest: Effort normal and breath sounds normal. No respiratory distress. She has no wheezes. She has no rales.  Abdominal: Soft. Bowel sounds are normal. She exhibits no distension. There is no tenderness. There is no guarding.  Musculoskeletal: She exhibits no edema.  Neurological: She is alert. She exhibits  normal muscle tone.  Oriented to person only. Face is symmetric. Left-sided neglect noted with rightward gaze preference. Right upper and lower extremity strength 3 out of 5. Left upper and lower extremity strength 5 out of 5. Does not withdraw to painful stimuli and right upper extremity.  Skin: Skin is warm. Capillary refill takes less than 2 seconds.  Psychiatric: She has a normal mood and affect.  Nursing note and vitals reviewed.    ED Treatments / Results  Labs (all labs ordered are listed, but only abnormal results are displayed) Labs Reviewed  URINALYSIS, ROUTINE W REFLEX MICROSCOPIC (NOT AT [facility]) - Abnormal; Notable for the following:       Result Value   Color, Urine AMBER (*)    All other  components within normal limits  COMPREHENSIVE METABOLIC PANEL - Abnormal; Notable for the following:    Glucose, Bld 107 (*)    Creatinine, Ser 1.04 (*)    AST 45 (*)    Total Bilirubin 2.5 (*)    GFR calc non Af Amer 47 (*)    GFR calc Af Amer 54 (*)    All other components within normal limits  DIGOXIN LEVEL - Abnormal; Notable for the following:    Digoxin Level 0.3 (*)    All other components within normal limits  CBC  PROTIME-INR  CBG MONITORING, ED  I-STAT CG4 LACTIC ACID, ED  I-STAT CG4 LACTIC ACID, ED    EKG  EKG Interpretation None       Radiology Dg Chest 2 View  Result Date: 02/17/2016 CLINICAL DATA:  Right-sided weakness. EXAM: CHEST  2 VIEW COMPARISON:  Chest x-ray 11/23/2015, lumbar spine 11/24/2015 FINDINGS: Mild hyperinflation. Negative for pneumonia. Negative for heart failure or effusion. Severe compression fracture L1 has progressed since the prior study of 11/24/2015. IMPRESSION: Pulmonary hyperinflation.  No acute cardiopulmonary abnormality Severe compression fracture L1 with significant progression since 11/24/2015 Electronically Signed   By: Franchot Gallo M.D.   On: 02/17/2016 12:45   Ct Head Wo Contrast  Result Date: 02/17/2016 CLINICAL DATA:   Altered mental status. EXAM: CT HEAD WITHOUT CONTRAST TECHNIQUE: Contiguous axial images were obtained from the base of the skull through the vertex without intravenous contrast. COMPARISON:  CT scan of November 23, 2015. FINDINGS: Brain: Mild diffuse cortical atrophy is noted. Mild chronic ischemic white matter disease is noted. No mass effect or midline shift is noted. Ventricular size is within normal limits. There is no evidence of mass lesion, hemorrhage or acute infarction. Vascular: Atherosclerosis of carotid siphons is noted. Skull: Bony calvarium appears intact. Sinuses/Orbits: Visualized paranasal sinuses appear normal. Other: None. IMPRESSION: Mild diffuse cortical atrophy. Mild chronic ischemic white matter disease. No acute intracranial abnormality seen. Electronically Signed   By: Marijo Conception, M.D.   On: 02/17/2016 12:02   Mr Brain Wo Contrast  Result Date: 02/17/2016 CLINICAL DATA:  Right-sided weakness EXAM: MRI HEAD WITHOUT CONTRAST TECHNIQUE: Multiplanar, multiecho pulse sequences of the brain and surrounding structures were obtained without intravenous contrast. COMPARISON:  Head CT from earlier today.  Brain MRI 08/18/2015 FINDINGS: Brain: Restricted diffusion in the left PCA territory involving the inferior and parasagittal occipital lobe, periventricular white matter, and posterior left thalamus. 1 cm or smaller DWI hyperintense foci in the parasagittal left frontal lobe and posterior left frontal white matter, 3:34 and 3:36, are not convincing when correlated with ADC map and could be related to motion artifact. No evidence of superimposed hemorrhage. There is generalized atrophy with moderate mesial temporal volume loss, similar to prior. Chronic microvascular disease with ischemic gliosis confluent the deep cerebral white matter. Prominent dilated perivascular spaces in the deep gray nuclei. Vascular: Grossly preserved flow voids. Skull and upper cervical spine: No focal lesion  identified. Sinuses/Orbits: Mild mucosal thickening in the left sphenoid sinus with retained secretions. IMPRESSION: 1. Patchy acute infarct in the left PCA territory, involving the occipital lobe and thalamus. 2. Cannot exclude small foci acute infarction in the left MCA and ACA territories, certainty limited by extensive motion artifact. 3. Atrophy including mesial temporal lobes. Moderate chronic microvascular disease. Electronically Signed   By: Monte Fantasia M.D.   On: 02/17/2016 13:19    Procedures Procedures (including critical care time)  Medications Ordered in ED Medications  sodium  chloride 0.9 % bolus 1,000 mL (0 mLs Intravenous Stopped 02/17/16 1243)     Initial Impression / Assessment and Plan / ED Course  I have reviewed the triage vital signs and the nursing notes.  Pertinent labs & imaging results that were available during my care of the patient were reviewed by me and considered in my medical decision making (see chart for details).  Clinical Course     82 year old female with past medical history of dementia here with left-sided gaze preference, right-sided arm and leg weakness. On arrival, patient hypertensive but otherwise hemodynamically stable. Initial CT head is negative. Concern for acute stroke versus encephalopathy secondary to UTI, metabolic derangement, or medication effect. MRI subsequently obtained and shows acute left ICA stroke. Labs are otherwise unremarkable with no evidence of UTI or significant abdomen mildly. Discussed with neurology and will admit to [facility] for further workup.   Final Clinical Impressions(s) / ED Diagnoses   Final diagnoses:  Cerebrovascular accident (CVA) due to other mechanism [facility])      Duffy Bruce, MD 02/17/16 1616

## 2016-02-18 ENCOUNTER — Encounter (HOSPITAL_COMMUNITY): Payer: Medicare Other

## 2016-02-18 ENCOUNTER — Inpatient Hospital Stay (HOSPITAL_COMMUNITY)

## 2016-02-18 DIAGNOSIS — I63512 Cerebral infarction due to unspecified occlusion or stenosis of left middle cerebral artery: Secondary | ICD-10-CM

## 2016-02-18 LAB — LIPID PANEL
CHOLESTEROL: 226 mg/dL — AB (ref 0–200)
HDL: 46 mg/dL (ref >40)
LDL Cholesterol: 162 mg/dL — ABNORMAL HIGH (ref 0–99)
Total CHOL/HDL Ratio: 4.9 RATIO
Triglycerides: 89 mg/dL (ref <150)
VLDL: 18 mg/dL (ref 0–40)

## 2016-02-18 MED ORDER — METOPROLOL TARTRATE 5 MG/5ML IV SOLN
INTRAVENOUS | Status: AC
  Filled 2016-02-18: qty 5

## 2016-02-18 MED ORDER — HALOPERIDOL LACTATE 5 MG/ML IJ SOLN
1.0000 mg | Freq: Once | INTRAMUSCULAR | Status: AC
  Administered 2016-02-18: 1 mg via INTRAVENOUS
  Filled 2016-02-18: qty 1

## 2016-02-18 MED ORDER — METOPROLOL TARTRATE 5 MG/5ML IV SOLN
2.5000 mg | INTRAVENOUS | Status: AC | PRN
  Administered 2016-02-18 (×3): 2.5 mg via INTRAVENOUS
  Filled 2016-02-18: qty 5

## 2016-02-18 MED ORDER — ORAL CARE MOUTH RINSE
15.0000 mL | Freq: Two times a day (BID) | OROMUCOSAL | Status: DC
  Administered 2016-02-18 – 2016-02-20 (×5): 15 mL via OROMUCOSAL

## 2016-02-18 MED ORDER — CHLORHEXIDINE GLUCONATE 0.12 % MT SOLN
15.0000 mL | Freq: Two times a day (BID) | OROMUCOSAL | Status: DC
  Administered 2016-02-18 – 2016-02-20 (×5): 15 mL via OROMUCOSAL
  Filled 2016-02-18 (×4): qty 15

## 2016-02-18 MED ORDER — ASPIRIN 81 MG PO CHEW
81.0000 mg | CHEWABLE_TABLET | Freq: Every day | ORAL | Status: DC
  Administered 2016-02-19 – 2016-02-20 (×2): 81 mg via ORAL
  Filled 2016-02-18 (×3): qty 1

## 2016-02-18 NOTE — Progress Notes (Signed)
[name] 50[facility]15 [facility] Liaison note  [facility] facesheet and med list placed on shadow chart.  This is an [facility] related and covered admission of 02/17/16, per Dr. Catalina Lunger Mazzocchi.  Patient went from her independent living facility to [facility] on 11/14, after caregivers noted changes in her mental status and right sided weakness.  Patient was then transferred to [facility] where work-up revealed an acute ischemic stroke.    Met with patient and family friend, Mickel Baas, at the bedside.  Also spoke with patient's son, Clair Gulling, by telephone.  Clair Gulling states he plans to arrive on 11/16.  Patient appears irritable/restless.  Requested nurse tech assist patient with repositioning.  Patient was also suctioned by nurse tech.    Patient gives one word responses to questions asked.  She denies pain.  She appears to have difficulty managing oral secretions.  Per Staff RN, Ave Filter, patient will have an MBS at 2:30 this afternoon.  Patient is currently NPO.    Noted, patient is receiving 0.9% NACL via peripheral IV at 75 ml/hr. Patient is also receiving Metoprolol 2.5 mg IV prn for high bp (and has received 3 doses today).    [facility] RN will continue to follow daily and assist as needed.  Efrain Sella, RN, BSN [phone]

## 2016-02-18 NOTE — Progress Notes (Signed)
PROGRESS NOTE    Nancy [name]  [id number] DOB: 1926/02/19 DOA: 02/17/2016 PCP: [name], MD  Brief Narrative:This is an 33-yo woman with h/o dementia who presented to the ED for evaluation of R--sided weakness. She resides in an assistant living facility in the independent section, with caregivers checking in on her several times daily. Caregivers noted that the patient did not seem as sharp as usual 11/14 and noted right-sided weakness. She was taken to the [facility] ED where exam showed a right gaze preference with 3/5 strength in the R arm and leg, no facial droop. She did not withdraw to pain on the right side. MRI of the brain was obtained and showed patchy areas of acute ischemia in the L cerebral hemisphere. She was transferred to [facility] for further evaluation of acute stroke  Assessment & Plan:  Stroke:  Patchy left PCA infarct embolic secondary to known atrial fibrillation  -MRA  Not ordered -d/w NEurology Dr.Sethi felt poor prognosis recommends Palliative care/Hospice  -Carotid Doppler and ECHO cancelled -continued on ASA -She currently followed by hospice at home in the last 3 weeks -await MBS/swallow evaluation this afternoon -Discussed the possibility of pursuing residential hospice with family friend and son at bedside  Atrial Fibrillation w/ RVR -continue digoxin and metoprolol -Chads score is 6, or anticoagulation stopped per family  Dementia -known, moderate per family friend   Hypertension -continue metoprolol  Hyperlipidemia -no plans to start statin now  Coronary artery disease  - NSTEMI 07/2015, stable continue ASA/metoprolol  DVT prophylaxis: lovenox Code Status:DNR Family Communication:son and female caregiver at bedisde Disposition Plan:may need residential hospice   Consultants:  Neurology    Subjective: Eyes open, mumbles few words  Objective: Vitals:   02/18/16 0500 02/18/16 0600 02/18/16 0644 02/18/16 0928  BP: (!) 163/85  (!) 166/71  (!) 157/90  Pulse: (!) 107 62  74  Resp: 20 18  20   Temp:   98.8 F (37.1 C) 98.2 F (36.8 C)  TempSrc:    Oral  SpO2: 99% 96%  94%  Weight:      Height:        Intake/Output Summary (Last 24 hours) at 02/18/16 1400 Last data filed at 02/18/16 1235  Gross per 24 hour  Intake             1245 ml  Output                0 ml  Net             1245 ml   Filed Weights   02/17/16 1700  Weight: 50.4 kg (111 lb 1.8 oz)    Examination:  General exam: eyes open, mumbles, no distress Respiratory system: Clear to auscultation. Respiratory effort normal. Cardiovascular system: S1 & S2 heard, RRR. No JVD, murmurs, rubs, gallops or clicks. No pedal edema. Gastrointestinal system: Abdomen is nondistended, soft and nontender. No organomegaly or masses felt. Normal bowel sounds heard. Central nervous system: not oriented,will not follow commands, Mild right hemiparesis, R sided neglect noted Extremities: no edema Skin: No rashes, lesions or ulcers Psychiatry: unable to assess    Data Reviewed: I have personally reviewed following labs and imaging studies  CBC:  Recent Labs Lab 02/17/16 1118 02/17/16 1750  WBC 7.8 10.7*  HGB 14.5 14.5  HCT 42.0 42.4  MCV 93.3 93.6  PLT 287 [id number]   Basic Metabolic Panel:  Recent Labs Lab 02/17/16 1118 02/17/16 1750  NA 139  --  K 4.8  --   CL 101  --   CO2 29  --   GLUCOSE 107*  --   BUN 13  --   CREATININE 1.04* 0.95  CALCIUM 9.5  --    GFR: Estimated Creatinine Clearance: 32.6 mL/min (by C-G formula based on SCr of 0.95 mg/dL). Liver Function Tests:  Recent Labs Lab 02/17/16 1118  AST 45*  ALT 21  ALKPHOS 114  BILITOT 2.5*  PROT 7.4  ALBUMIN 3.7   No results for input(s): LIPASE, AMYLASE in the last 168 hours. No results for input(s): AMMONIA in the last 168 hours. Coagulation Profile:  Recent Labs Lab 02/17/16 1119  INR 1.15   Cardiac Enzymes: No results for input(s): CKTOTAL, CKMB, CKMBINDEX,  TROPONINI in the last 168 hours. BNP (last 3 results) No results for input(s): PROBNP in the last 8760 hours. HbA1C: No results for input(s): HGBA1C in the last 72 hours. CBG:  Recent Labs Lab 02/17/16 1128  GLUCAP 82   Lipid Profile:  Recent Labs  02/18/16 0224  CHOL 226*  HDL 46  LDLCALC 162*  TRIG 89  CHOLHDL 4.9   Thyroid Function Tests: No results for input(s): TSH, T4TOTAL, FREET4, T3FREE, THYROIDAB in the last 72 hours. Anemia Panel: No results for input(s): VITAMINB12, FOLATE, FERRITIN, TIBC, IRON, RETICCTPCT in the last 72 hours. Urine analysis:    Component Value Date/Time   COLORURINE AMBER (A) 02/17/2016 1141   APPEARANCEUR CLEAR 02/17/2016 1141   LABSPEC 1.018 02/17/2016 1141   PHURINE 6.5 02/17/2016 1141   GLUCOSEU NEGATIVE 02/17/2016 1141   HGBUR NEGATIVE 02/17/2016 1141   BILIRUBINUR NEGATIVE 02/17/2016 1141   KETONESUR NEGATIVE 02/17/2016 1141   PROTEINUR NEGATIVE 02/17/2016 1141   NITRITE NEGATIVE 02/17/2016 1141   LEUKOCYTESUR NEGATIVE 02/17/2016 1141   Sepsis Labs: @LABRCNTIP (procalcitonin:4,lacticidven:4)  )No results found for this or any previous visit (from the past 240 hour(s)).       Radiology Studies: Dg Chest 2 View  Result Date: 02/17/2016 CLINICAL DATA:  Right-sided weakness. EXAM: CHEST  2 VIEW COMPARISON:  Chest x-ray 11/23/2015, lumbar spine 11/24/2015 FINDINGS: Mild hyperinflation. Negative for pneumonia. Negative for heart failure or effusion. Severe compression fracture L1 has progressed since the prior study of 11/24/2015. IMPRESSION: Pulmonary hyperinflation.  No acute cardiopulmonary abnormality Severe compression fracture L1 with significant progression since 11/24/2015 Electronically Signed   By: Franchot Gallo M.D.   On: 02/17/2016 12:45   Ct Head Wo Contrast  Result Date: 02/17/2016 CLINICAL DATA:  Altered mental status. EXAM: CT HEAD WITHOUT CONTRAST TECHNIQUE: Contiguous axial images were obtained from the base  of the skull through the vertex without intravenous contrast. COMPARISON:  CT scan of November 23, 2015. FINDINGS: Brain: Mild diffuse cortical atrophy is noted. Mild chronic ischemic white matter disease is noted. No mass effect or midline shift is noted. Ventricular size is within normal limits. There is no evidence of mass lesion, hemorrhage or acute infarction. Vascular: Atherosclerosis of carotid siphons is noted. Skull: Bony calvarium appears intact. Sinuses/Orbits: Visualized paranasal sinuses appear normal. Other: None. IMPRESSION: Mild diffuse cortical atrophy. Mild chronic ischemic white matter disease. No acute intracranial abnormality seen. Electronically Signed   By: Marijo Conception, M.D.   On: 02/17/2016 12:02   Mr Brain Wo Contrast  Result Date: 02/17/2016 CLINICAL DATA:  Right-sided weakness EXAM: MRI HEAD WITHOUT CONTRAST TECHNIQUE: Multiplanar, multiecho pulse sequences of the brain and surrounding structures were obtained without intravenous contrast. COMPARISON:  Head CT from earlier today.  Brain  MRI 08/18/2015 FINDINGS: Brain: Restricted diffusion in the left PCA territory involving the inferior and parasagittal occipital lobe, periventricular white matter, and posterior left thalamus. 1 cm or smaller DWI hyperintense foci in the parasagittal left frontal lobe and posterior left frontal white matter, 3:34 and 3:36, are not convincing when correlated with ADC map and could be related to motion artifact. No evidence of superimposed hemorrhage. There is generalized atrophy with moderate mesial temporal volume loss, similar to prior. Chronic microvascular disease with ischemic gliosis confluent the deep cerebral white matter. Prominent dilated perivascular spaces in the deep gray nuclei. Vascular: Grossly preserved flow voids. Skull and upper cervical spine: No focal lesion identified. Sinuses/Orbits: Mild mucosal thickening in the left sphenoid sinus with retained secretions. IMPRESSION: 1.  Patchy acute infarct in the left PCA territory, involving the occipital lobe and thalamus. 2. Cannot exclude small foci acute infarction in the left MCA and ACA territories, certainty limited by extensive motion artifact. 3. Atrophy including mesial temporal lobes. Moderate chronic microvascular disease. Electronically Signed   By: Monte Fantasia M.D.   On: 02/17/2016 13:19        Scheduled Meds: . sodium chloride   Intravenous STAT  . aspirin  81 mg Oral Daily  . chlorhexidine  15 mL Mouth Rinse BID  . digoxin  0.125 mg Oral Daily  . enoxaparin (LOVENOX) injection  30 mg Subcutaneous Q24H  . mouth rinse  15 mL Mouth Rinse q12n4p  . metoprolol      . metoprolol tartrate  37.5 mg Oral BID   Continuous Infusions: . sodium chloride 75 mL/hr at 02/18/16 0928     LOS: 1 day    Time spent: 70min    Domenic Polite, MD [facility] Pager [phone]  If 7PM-7AM, please contact night-coverage [email or web address] Password [email or web address] 02/18/2016, 2:00 PM

## 2016-02-18 NOTE — Progress Notes (Signed)
OT Cancellation Note  Patient Details Name: Nancy [name] MRN: [id number] DOB: [birth year]   Cancelled Treatment:    Reason Eval/Treat Not Completed: Patient at procedure or test/ unavailable Pt out of room at this time for MBS. OT will attempt at the next most appropriate time.   Parke Poisson B 02/18/2016, 14:00PM

## 2016-02-18 NOTE — Social Work (Signed)
[facility] SW Note -  SW met with pt to assess comfort and needs.  Pt was alert, but kept falling asleep during visit.  Pt's family friend, Mickel Baas, was present.  Pt gave brief yes/no answers.  She did not display any nonverbal indicators of pain.  She attempted to remove her mitten when alert.  SW is familiar with pt from [place].  SW is available for meeting tomorrow with son.  Creola Corn. [place], [place]

## 2016-02-18 NOTE — Progress Notes (Addendum)
Modified Barium Swallow Progress Note  Patient Details  Name: Dann Magistro MRN: [id number] Date of Birth: 1925-05-15  Today's Date: 02/18/2016  Modified Barium Swallow completed.  Full report located under Chart Review in the Imaging Section.  Brief recommendations include the following:  Clinical Impression  Pt exhibited mod-severe oral and severe pharyngeal dysphagia resulting in delayed oral transit up to 1-3 minutes exacerbated by aphasia and cognitive impairments (poor attentin). Premature spill with most consistencies. Silent and frank aspiration with nectar thick during the swallow. Max vallecuar and mild-mod pyriform sinsuses with all consistencies. She is unable to perform compensatory strategies and is at high risk for aspiration with all consistencies at present. Spoke with pt's former daughter-in-law and pt's MD and thought is pt may have comfort care with comfort feeds with known aspiration risks. Discussed with MD and decided to hold on ordering comfort care diet until pt's son and POA arrives next date.   If comfort feeds is decided upon, recommend Dys 1 and thin liquids.    Swallow Evaluation Recommendations       SLP Diet Recommendations: NPO                       Oral Care Recommendations: Oral care BID        Houston Siren 02/18/2016,4:01 PM   Orbie Pyo [place].Ed [employer] [phone]

## 2016-02-18 NOTE — Evaluation (Signed)
Speech Language Pathology Evaluation Patient Details Name: Nancy [name] MRN: [id number] DOB: [birth year] Today's Date: 02/18/2016 Time: 2952-8413 SLP Time Calculation (min) (ACUTE ONLY): 8 min  Problem List:  Patient Active Problem List   Diagnosis Date Noted  . CVA (cerebral vascular accident) (HCC) 02/17/2016  . Stroke (cerebrum) (HCC) 02/17/2016  . Rhabdomyolysis 09/30/2015  . Drug-induced injury of liver   . Liver injury   . AKI (acute kidney injury) (HCC)   . Chronic fatigue   . Elevated LFTs 09/25/2015  . Cholelithiasis 09/25/2015  . PAF (paroxysmal atrial fibrillation) (HCC) 09/25/2015  . Chronic kidney disease (CKD), stage IV (severe) (HCC) 09/25/2015  . Systolic and diastolic CHF, acute on chronic (HCC) 09/25/2015  . SOB (shortness of breath) 09/24/2015  . Fatigue 09/24/2015  . Melena 08/30/2015  . Moderate to severe pulmonary hypertension 08/30/2015  . Acute epigastric pain 08/30/2015  . Elevated transaminase level 08/30/2015  . Acute diastolic heart failure, NYHA class 1 (HCC) 08/30/2015  . Chronic anticoagulation 08/11/2015  . Elevated troponin 07/20/2015  . Recent NSTEMI  07/20/2015  . Chest pain with high risk of acute coronary syndrome 07/20/2015  . Contact dermatitis 12/18/2014  . Encounter for therapeutic drug monitoring 09/05/2014  . Acute combined systolic (EF 45%) and grade 3 diastolic heart failure (HCC) 03/11/2014  . Mitral regurgitation 02/11/2014  . FATIGUE / MALAISE 11/05/2009  . Atrial fibrillation (HCC) 08/26/2009  . SCARLET FEVER 07/29/2009  . COPD GOLD II  07/29/2009  . GERD 07/29/2009  . DIVERTICULOSIS, COLON 07/29/2009  . COLONIC POLYPS, ADENOMATOUS, HX OF 07/29/2009  . HLD (hyperlipidemia) 07/02/2009  . EXPRESSIVE LANGUAGE DISORDER 07/02/2009  . Memory loss 07/02/2009  . APHASIA 07/02/2009   Past Medical History:  Past Medical History:  Diagnosis Date  . CKD (chronic kidney disease), stage III   . Depression   . Dyslipidemia   .  GERD (gastroesophageal reflux disease)   . History of scarlet fever   . Melanoma (HCC)   . Migraine   . Mitral regurgitation    a. Echo (11/15):  EF 50-55%, Gr 2 DD, MAC, prolapsing P2 segment of post MV leaflet with mod to possibly severe MR, mild LAE, normal RVF, PASP 35 mmHg;  b. Echo 5/16: Mild LVH, EF 55-60%, trivial AI, moderate MR (LVID, ES 32.3 mm), mild BAE, moderate TR, PASP 48 c. Echo 07/2015:EF 45-50%, moderate to severe MR   . NSTEMI (non-ST elevated myocardial infarction) (HCC) 07/2015   a. Troponin peak of 7.17. Cath w/ normal cors, thought to be 2ry to vasospasm  . PAF (paroxysmal atrial fibrillation) (HCC)    a. On Coumadin   Past Surgical History:  Past Surgical History:  Procedure Laterality Date  . APPENDECTOMY    . CARDIAC CATHETERIZATION N/A 07/21/2015   Procedure: Left Heart Cath and Coronary Angiography;  Surgeon: Runell Gess, MD;  Location: [facility] INVASIVE CV LAB;  Service: Cardiovascular;  Laterality: N/A;  . CATARACT EXTRACTION     bilateral  . CESAREAN SECTION    . SKIN BIOPSY    . TONSILLECTOMY     HPI:  82 year old female with past medical history of mild to moderate dementia from SNF who presents with altered mental status, right sided arm and leg weakness. MRI patchy acute infarct in the left PCA territory, involving the occipital lobe and thalamus, cannot exclude small foci acute infarction in the left MCA and ACA territories, certainty limited by extensive motion artifact.   Assessment / Plan / Recommendation Clinical Impression  Baseline  communication not known. Pt non verbal majority of session however did state spontaneous words x 2 during eval. Pt exhibited poor sustained attention and required frequent redirection. Unable to follow one step commands and significant right neglect. Pt would benefit from ongoing ST services and will determine baseline communication abilities.     SLP Assessment  Patient needs continued Speech Lanaguage Pathology  Services    Follow Up Recommendations  Skilled Nursing facility    Frequency and Duration min 2x/week  2 weeks      SLP Evaluation Cognition  Overall Cognitive Status: Impaired/Different from baseline (suspect-no family present) Arousal/Alertness:  (awake/periods of sleepiness and apnea) Orientation Level:  (no response to yes/no questions) Attention: Sustained Sustained Attention: Impaired Sustained Attention Impairment: Functional basic;Verbal basic Memory:  (TBA) Awareness: Impaired Awareness Impairment: Emergent impairment Problem Solving: Impaired Problem Solving Impairment: Functional basic Behaviors: Restless Safety/Judgment: Impaired       Comprehension  Auditory Comprehension Overall Auditory Comprehension: Impaired Yes/No Questions:  (no response to y/n questions) Commands: Impaired One Step Basic Commands: 0-24% accurate Interfering Components: Attention Visual Recognition/Discrimination Discrimination: Not tested Reading Comprehension Reading Status: Not tested    Expression Expression Primary Mode of Expression:  (non verbal majority of the time) Verbal Expression Overall Verbal Expression: Impaired Initiation: Impaired Level of Generative/Spontaneous Verbalization: Word (spontaneous x 2) Repetition: Impaired Level of Impairment: Word level Naming: Impairment Confrontation: Impaired Convergent: 0-24% accurate Pragmatics: Impairment Impairments: Eye contact Interfering Components: Attention Written Expression Dominant Hand: Left Written Expression: Not tested   Oral / Motor  Oral Motor/Sensory Function Overall Oral Motor/Sensory Function:  (unable to accurately assess/apraxia) Motor Speech Overall Motor Speech: Impaired Respiration: Impaired (periods of apnea when becomes sleepy) Phonation: Low vocal intensity Resonance:  (UTA) Articulation:  (UTA) Intelligibility: Unable to assess (comment) Motor Planning: Impaired Motor Speech Errors:  Unaware   GO                    Royce Macadamia 02/18/2016, 9:19 AM   Breck Coons Avon Molock M.Ed [employer] [phone]

## 2016-02-18 NOTE — Evaluation (Signed)
Physical Therapy Evaluation Patient Details Name: Nancy [name] MRN: [id number] DOB: 1925-04-11 Today's Date: 02/18/2016   History of Present Illness  HPI 82 year old female with past medical history of mild to moderate dementia who presents with altered mental status. Per report from nursing facility, the patient is alert and oriented at baseline to person and place but not time. She is normally ambulatory with a walker. She was normal night of 11/13 upon going to bed. Upon awakening this morning, patient was noted to have right sided arm and leg weakness. MR brain shows.   Patchy acute infarct in the left PCA territory, involving the occipital lobe and thalamus  Clinical Impression  The  Patient presents with significant  Right neglect, requiring max cues to turn head to near midline. Easily distracted to the left. Noted minimal volitional gross movement of the right U and LE. The patient is noted at times to have periods of apnea, more of  A stare gaze, then begins to breath deep and rapid. The patient does not follow verbal commands or gestures.Pt admitted with above diagnosis. Pt currently with functional limitations due to the deficits listed below (see PT Problem List).  Pt will benefit from skilled PT to increase their independence and safety with mobility to allow discharge to the venue listed below.     Follow Up Recommendations SNF;Supervision/Assistance - 24 hour    Equipment Recommendations  None recommended by PT    Recommendations for Other Services       Precautions / Restrictions Precautions Precautions: Fall      Mobility  Bed Mobility Overal bed mobility: Needs Assistance Bed Mobility: Rolling;Supine to Sit;Sit to Supine Rolling: Mod assist   Supine to sit: Mod assist;HOB elevated Sit to supine: Max assist   General bed mobility comments: with multimodal cues, hand over hand to rech for  left rail, the patient rolled. unable to roll to the left without max  assistance. Tactile cues to sit to edge of bed with patient  moving the legs toward the edge and sitting up, trunk leaning forward and to the right. The patient was noted to grossly place the right hand on the bed and use it to  push  toward bed edge. The right  hand was then noted to be left  behind  and under her right hip. The patient then required  max assistance to return to supine. The right leg was noted to have increased extensor tone during the transition.  Transfers                 General transfer comment: NT  Ambulation/Gait                Stairs            Wheelchair Mobility    Modified Rankin (Stroke Patients Only)       Balance Overall balance assessment: Needs assistance Sitting-balance support: Single extremity supported;Feet supported Sitting balance-Leahy Scale: Poor Sitting balance - Comments: leaning forward Postural control: Left lateral lean                                   Pertinent Vitals/Pain Pain Assessment: Faces Faces Pain Scale: No hurt    Home Living     Available Help at Discharge: [place] Type of Home: Reserve           Additional Comments: from ALF  Prior Function           Comments: per notes and previous encounter 4 months ago, was ambualtory with RW and performed ADL's     Hand Dominance   Dominant Hand: Left    Extremity/Trunk Assessment               Lower Extremity Assessment: RLE deficits/detail RLE Deficits / Details: increased extension tone noted when  she moved around, Noted volitional knee and hip flexion x 1 when in supine.  R toes upgoing posture.    Cervical / Trunk Assessment: Other exceptions  Communication   Communication: Expressive difficulties;Receptive difficulties (attempted  speaking but garbled)  Cognition Arousal/Alertness: Awake/alert   Overall Cognitive Status: Impaired/Different from baseline Area of Impairment:  Following commands;Awareness               General Comments: does not follow any verbal commands. Noted  to place cup to face(missed mouth) when given by ST.      General Comments      Exercises     Assessment/Plan    PT Assessment Patient needs continued PT services  PT Problem List Decreased strength;Decreased range of motion;Decreased activity tolerance;Decreased balance;Decreased mobility;Impaired tone;Decreased cognition;Decreased safety awareness;Impaired sensation          PT Treatment Interventions Functional mobility training;Therapeutic activities;Balance training;Patient/family education;Neuromuscular re-education    PT Goals (Current goals can be found in the Care Plan section)  Acute Rehab PT Goals Patient Stated Goal: patient unable  PT Goal Formulation: Patient unable to participate in goal setting Time For Goal Achievement: 03/03/16 Potential to Achieve Goals: Fair    Frequency Min 2X/week   Barriers to discharge        Co-evaluation               End of Session   Activity Tolerance: Patient tolerated treatment well Patient left: in bed;with call bell/phone within reach;with bed alarm set Nurse Communication: Mobility status         Time: [id number] PT Time Calculation (min) (ACUTE ONLY): 25 min   Charges:   PT Evaluation $PT Eval Moderate Complexity: 1 Procedure PT Treatments $Neuromuscular Re-education: 8-22 mins   PT G Codes:        Claretha Cooper 02/18/2016, 10:04 AM Tresa Endo PT [phone]

## 2016-02-18 NOTE — Significant Event (Signed)
S/O: Called by nursing for new Neurological change consisting of new aphasia, agitation as well as tachycardia and elevated BP in the context of the patient's atrial fibrillation.   Haldol 1 mg IV given for agitation. 2.5 mg metoprolol given IV with resolution of tachycardia and some improvement in BP.   Examination after tachycardia resolved:   BP (!) 166/71 (BP Location: Left Arm)   Pulse 62   Temp 98.8 F (37.1 C)   Resp 18   Ht 5\' 6"  (1.676 m)   Wt 50.4 kg (111 lb 1.8 oz)   SpO2 96%   BMI 17.93 kg/m   Ment: Receptive and expressive aphasia (new). Word salad responses to all questions. Good eye contact and attempts to cooperate. No agitation noted. Mild/moderate right sided neglect.  CN: PERRL. Right visual field cut. Able to gaze to the right conjugately, but with difficulty. Decreased response to tactile applied in distribution of Vth nerve on right relative to left. Mild right facial droop. No hypophonia noted.  Motor: Right hemiparesis. LUE and LLE 5/5.  Sensory: Decreased attentiveness to right sided stimul. Cerebellar: No gross ataxia.  Gait: Deferred.   A/R: 1. Agitation. Resolved with low-dose IV Haldol. 2. Tachycardia. Improved with one dose IV metoprolol.  3. Continue previous orders. May need to add an additional antihypertensive.  4. Not a tPA candidate due to recent stroke. Overall testing and procedural risks for endovascular pathway are felt to outweigh potential benefits over the short and long term given the patient's dementia. Risks of morbidity and mortality from hemorrhagic complications of endovascular procedure are also significantly higher for this patient given her advanced age.  5. Starting ASA. Family considering restarting anticoagulation.  6. Obtain repeat MRI brain or CT head in 3 days to assess possible new left MCA ischemic stroke.   Electronically signed: Dr. Kerney Elbe

## 2016-02-18 NOTE — Progress Notes (Signed)
STROKE TEAM PROGRESS NOTE   HISTORY OF PRESENT ILLNESS (per record) This is an 82-yo woman with h/o dementia who presented to the ED for evaluation of R--sided weakness. She resides in an assistant living facility in the independent section, with caregivers checking in on her several times daily. Caregivers noted that the patient did not seem as sharp as usual today. When they tried to help her out of bed, she was noted to have right-sided weakness. She was last seen in her normal state of health last night before going to bed.   She was taken to the [facility] ED where exam showed a right gaze preference with 3/5 strength in the R arm and leg, no facial droop. She did not withdraw to pain on the right side. MRI of the brain was obtained and showed patchy areas of acute ischemia in the L cerebral hemisphere. She was transferred to [facility] for further evaluation of acute stroke.   On review of her chart, she has a known history of atrial fibrillation. Notes indicate that the patient and family elected not to put her on oral anticoagulation during her admission to the hospital in June 2017. She is also not taking a statin as she had rhabdomyolysis at that time as well. She was discharged on aspirin.   Patient was not administered IV t-PA. She was admitted for further evaluation and treatment.   SUBJECTIVE (INTERVAL HISTORY) No family is at the bedside.  She is lying in the bed. she does not speak, only moans.     OBJECTIVE Temp:  [97.8 F (36.6 C)-98.8 F (37.1 C)] 98.2 F (36.8 C) (11/15 0928) Pulse Rate:  [62-109] 74 (11/15 0928) Cardiac Rhythm: Atrial fibrillation (11/15 0700) Resp:  [16-24] 20 (11/15 0928) BP: (145-200)/(67-137) 157/90 (11/15 0928) SpO2:  [93 %-100 %] 94 % (11/15 0928) Weight:  [50.4 kg (111 lb 1.8 oz)] 50.4 kg (111 lb 1.8 oz) (11/14 1700)  CBC:   Recent Labs Lab 02/17/16 1118 02/17/16 1750  WBC 7.8 10.7*  HGB 14.5 14.5  HCT 42.0 42.4  MCV 93.3 93.6   PLT [id number]    Basic Metabolic Panel:   Recent Labs Lab 02/17/16 1118 02/17/16 1750  NA 139  --   K 4.8  --   CL 101  --   CO2 29  --   GLUCOSE 107*  --   BUN 13  --   CREATININE 1.04* 0.95  CALCIUM 9.5  --     Lipid Panel:     Component Value Date/Time   CHOL 226 (H) 02/18/2016 0224   TRIG 89 02/18/2016 0224   HDL 46 02/18/2016 0224   CHOLHDL 4.9 02/18/2016 0224   VLDL 18 02/18/2016 0224   LDLCALC 162 (H) 02/18/2016 0224   HgbA1c: No results found for: HGBA1C Urine Drug Screen: No results found for: LABOPIA, COCAINSCRNUR, LABBENZ, AMPHETMU, THCU, LABBARB    IMAGING  Dg Chest 2 View  Result Date: 02/17/2016 CLINICAL DATA:  Right-sided weakness. EXAM: CHEST  2 VIEW COMPARISON:  Chest x-ray 11/23/2015, lumbar spine 11/24/2015 FINDINGS: Mild hyperinflation. Negative for pneumonia. Negative for heart failure or effusion. Severe compression fracture L1 has progressed since the prior study of 11/24/2015. IMPRESSION: Pulmonary hyperinflation.  No acute cardiopulmonary abnormality Severe compression fracture L1 with significant progression since 11/24/2015 Electronically Signed   By: Franchot Gallo M.D.   On: 02/17/2016 12:45   Ct Head Wo Contrast  Result Date: 02/17/2016 CLINICAL DATA:  Altered mental status. EXAM:  CT HEAD WITHOUT CONTRAST TECHNIQUE: Contiguous axial images were obtained from the base of the skull through the vertex without intravenous contrast. COMPARISON:  CT scan of November 23, 2015. FINDINGS: Brain: Mild diffuse cortical atrophy is noted. Mild chronic ischemic white matter disease is noted. No mass effect or midline shift is noted. Ventricular size is within normal limits. There is no evidence of mass lesion, hemorrhage or acute infarction. Vascular: Atherosclerosis of carotid siphons is noted. Skull: Bony calvarium appears intact. Sinuses/Orbits: Visualized paranasal sinuses appear normal. Other: None. IMPRESSION: Mild diffuse cortical atrophy. Mild chronic  ischemic white matter disease. No acute intracranial abnormality seen. Electronically Signed   By: Marijo Conception, M.D.   On: 02/17/2016 12:02   Mr Brain Wo Contrast  Result Date: 02/17/2016 CLINICAL DATA:  Right-sided weakness EXAM: MRI HEAD WITHOUT CONTRAST TECHNIQUE: Multiplanar, multiecho pulse sequences of the brain and surrounding structures were obtained without intravenous contrast. COMPARISON:  Head CT from earlier today.  Brain MRI 08/18/2015 FINDINGS: Brain: Restricted diffusion in the left PCA territory involving the inferior and parasagittal occipital lobe, periventricular white matter, and posterior left thalamus. 1 cm or smaller DWI hyperintense foci in the parasagittal left frontal lobe and posterior left frontal white matter, 3:34 and 3:36, are not convincing when correlated with ADC map and could be related to motion artifact. No evidence of superimposed hemorrhage. There is generalized atrophy with moderate mesial temporal volume loss, similar to prior. Chronic microvascular disease with ischemic gliosis confluent the deep cerebral white matter. Prominent dilated perivascular spaces in the deep gray nuclei. Vascular: Grossly preserved flow voids. Skull and upper cervical spine: No focal lesion identified. Sinuses/Orbits: Mild mucosal thickening in the left sphenoid sinus with retained secretions. IMPRESSION: 1. Patchy acute infarct in the left PCA territory, involving the occipital lobe and thalamus. 2. Cannot exclude small foci acute infarction in the left MCA and ACA territories, certainty limited by extensive motion artifact. 3. Atrophy including mesial temporal lobes. Moderate chronic microvascular disease. Electronically Signed   By: Monte Fantasia M.D.   On: 02/17/2016 13:19       PHYSICAL EXAM Frail elderly Caucasian lady not in distress. . Afebrile. Head is nontraumatic. Neck is supple without bruit.    Cardiac exam no murmur or gallop. Lungs are clear to auscultation. Distal  pulses are well felt. Neurological Exam ;  Awake  Alert mute and barely speaks few nonsensical words. Will not follow any commands.left gaze deviation and right gaze palsy. She does not blink to threat on the right but will do so on the left. Right hemineglect..fundi were not visualized. Vision acuity and fields appear normal. Hearing is normal. Palatal movements are normal. Face asymmetric with right lower face weakness.. Tongue midline. Mild right hemiparesis but can move right side against gravity. Purposeful antigravity strength on the left without weakness.    ASSESSMENT/PLAN Nancy [name] is a 82 y.o. female with history of CKD, depresseion, HLD, GERD, migrain, MR and CAD presenting with R sided weakness. She did not receive IV t-PA.   Stroke:  Patchy left PCA infarct embolic secondary to known atrial fibrillation   MRI  Patchy L PCA infarct  MRA  Not ordered  Carotid Doppler  canceled  2D Echo  canceled  LDL 162  HgbA1c pending  Lovenox 30 mg sq daily for VTE prophylaxis Diet NPO time specified  aspirin 81 mg daily prior to admission, now on aspirin 81 mg daily  Ongoing aggressive stroke risk factor management  Therapy recommendations:  SNF.   Disposition:  pending  (from ALF, independent. Per RN, involved with hospice)  Dr. Leonie Man discussed with Dr. Broadus John related to end of life care. No further stroke testing  Atrial Fibrillation w/ RVR  Treated with IV metoprolol during the nigh  Home anticoagulation:  none . Refused AC during June 2017 hospital admission  CHA2DS2-VASc Score = 6, ?2 oral anticoagulation recommended  Age in Years:  ?81   +2    Sex:  Female  Female   +1    Hypertension History:  0     Diabetes Mellitus:  0   Congestive Heart Failure History:  0  Vascular Disease History:  yes   +1     Stroke/TIA/Thromboembolism History:  2  Consider AC depending on plan of care decided on    Hypertension  Stable  Permissive hypertension (OK if <  220/120) but gradually normalize in 5-7 days  Long-term BP goal normotensive  Hyperlipidemia  Home meds:  No statin  LDL 162, goal < 70  Consider statin at discharge pending plan of care  Other Stroke Risk Factors  Advanced age  Former Cigarette smoker  ETOH use  Documented Hx stroke/TIA, however, no stroke admissions or positive MRIs in EPIC  Coronary artery disease - NSTEMI 07/2015  Migraines, hx complicated migraine 0000000. Seen by Leonie Man at that time  Other Active Problems  Baseline dementia with behavioral disturbance, treated with haldol  CKD stage III  Hospital day # [place] for Pager information 02/18/2016 10:42 AM  I have personally examined this patient, reviewed notes, independently viewed imaging studies, participated in medical decision making and plan of care.ROS completed by me personally and pertinent positives fully documented  I have made any additions or clarifications directly to the above note. Agree with note above.  She has presented with right-sided weakness secondary to left posterior cerebral artery embolic infarct from atrial fibrillation. She has underlying dementia and prior history of stroke and family has chosen not to do long-term anticoagulation. Her prognosis is very poor and agree with hospice and palliative care. No further stroke workup is necessary. Discussed with Dr. Broadus John. Greater than 50% time during this 25 minute visit was spent on counseling and coordination of care of her stroke risk, prevention and treatment Kindly call for questions. Stroke team will sign off. Antony Contras, MD Medical Director [facility] Stroke [facility] Pager: [phone] 02/18/2016 1:40 PM  To contact Stroke Continuity provider, please refer to [email or web address] After hours, contact General Neurology

## 2016-02-18 NOTE — Evaluation (Signed)
Clinical/Bedside Swallow Evaluation Patient Details  Name: Nancy [name] MRN: [id number] Date of Birth: Jan 04, 1926  Today's Date: 02/18/2016 Time: SLP Start Time (ACUTE ONLY): [id number] SLP Stop Time (ACUTE ONLY): 0847 SLP Time Calculation (min) (ACUTE ONLY): 10 min  Past Medical History:  Past Medical History:  Diagnosis Date  . CKD (chronic kidney disease), stage III   . Depression   . Dyslipidemia   . GERD (gastroesophageal reflux disease)   . History of scarlet fever   . Melanoma ([place])   . Migraine   . Mitral regurgitation    a. Echo (11/15):  EF 50-55%, Gr 2 DD, MAC, prolapsing P2 segment of post MV leaflet with mod to possibly severe MR, mild LAE, normal RVF, PASP 35 mmHg;  b. Echo 5/16: Mild LVH, EF 55-60%, trivial AI, moderate MR (LVID, ES 32.3 mm), mild BAE, moderate TR, PASP 48 c. Echo 07/2015:EF 45-50%, moderate to severe MR   . NSTEMI (non-ST elevated myocardial infarction) ([place]) 07/2015   a. Troponin peak of 7.17. Cath w/ normal cors, thought to be 2ry to vasospasm  . PAF (paroxysmal atrial fibrillation) ([place])    a. On Coumadin   Past Surgical History:  Past Surgical History:  Procedure Laterality Date  . APPENDECTOMY    . CARDIAC CATHETERIZATION N/A 07/21/2015   Procedure: Left Heart Cath and Coronary Angiography;  Surgeon: Lorretta Harp, MD;  Location: [facility];  Service: Cardiovascular;  Laterality: N/A;  . CATARACT EXTRACTION     bilateral  . CESAREAN SECTION    . SKIN BIOPSY    . TONSILLECTOMY     HPI:  82 year old female with past medical history of mild to moderate dementia from SNF who presents with altered mental status, right sided arm and leg weakness. MRI patchy acute infarct in the left PCA territory, involving the occipital lobe and thalamus, cannot exclude small foci acute infarction in the left MCA and ACA territories, certainty limited by extensive motion artifact.   Assessment / Plan / Recommendation Clinical Impression  She required  max cues to attend to eating/drinking and assist to scoop food and initiate feeding. Likely penetration/aspiration with thin due to immediate cough and delayed throat clear mitigated with nectar thick liquids. Recommend crush meds in applesauce only until MBS today at 1400.    Aspiration Risk   (mod-severe)    Diet Recommendation NPO except meds   Medication Administration: Crushed with puree    Other  Recommendations Oral Care Recommendations: Oral care QID   Follow up Recommendations Skilled Nursing facility      Frequency and Duration            Prognosis        Swallow Study   General HPI: 82 year old female with past medical history of mild to moderate dementia from SNF who presents with altered mental status, right sided arm and leg weakness. MRI patchy acute infarct in the left PCA territory, involving the occipital lobe and thalamus, cannot exclude small foci acute infarction in the left MCA and ACA territories, certainty limited by extensive motion artifact. Type of Study: Bedside Swallow Evaluation Previous Swallow Assessment:  (none found) Diet Prior to this Study: NPO Temperature Spikes Noted: No Respiratory Status: Room air History of Recent Intubation: No Behavior/Cognition: Alert;Cooperative;Requires cueing;Distractible Oral Cavity Assessment: Dry Oral Care Completed by SLP: Yes Oral Cavity - Dentition: Adequate natural dentition (? did not open fully) Vision: Impaired for self-feeding (right inattention) Self-Feeding Abilities: Needs assist;Needs set up Patient Positioning: Upright in  bed Baseline Vocal Quality: Low vocal intensity Volitional Cough:  (unable) Volitional Swallow: Unable to elicit    Oral/Motor/Sensory Function Overall Oral Motor/Sensory Function:  (unable due to cognitive deficits or apraxia)   Ice Chips Ice chips: Not tested   Thin Liquid Thin Liquid: Impaired Presentation: Cup Oral Phase Functional Implications: Oral holding Pharyngeal   Phase Impairments: Suspected delayed Swallow;Cough - Immediate;Throat Clearing - Immediate    Nectar Thick Nectar Thick Liquid: Impaired Oral phase functional implications: Oral holding Pharyngeal Phase Impairments: Suspected delayed Swallow   Honey Thick Honey Thick Liquid: Not tested   Puree Puree: Impaired Oral Phase Functional Implications: Oral holding Pharyngeal Phase Impairments: Suspected delayed Swallow   Solid   GO   Solid: Not tested        Houston Siren 02/18/2016,9:06 AM   Orbie Pyo Colvin Caroli.Ed [employer] [phone]

## 2016-02-19 LAB — HEMOGLOBIN A1C
Hgb A1c MFr Bld: 5.6 % (ref 4.8–5.6)
Mean Plasma Glucose: 114 mg/dL

## 2016-02-19 LAB — GLUCOSE, CAPILLARY: GLUCOSE-CAPILLARY: 93 mg/dL (ref 65–99)

## 2016-02-19 MED ORDER — DIGOXIN 62.5 MCG PO TABS: 0.0625 mg | ORAL_TABLET | Freq: Every day | ORAL | Status: AC

## 2016-02-19 MED ORDER — SENNOSIDES-DOCUSATE SODIUM 8.6-50 MG PO TABS: 1.0000 | ORAL_TABLET | Freq: Every evening | ORAL | Status: AC | PRN

## 2016-02-19 NOTE — Progress Notes (Signed)
Nancy [name] 40M-13 [facility] Liaison note  [facility] facesheet and med list placed on shadow chart.  This is an [facility] related and covered admission of 02/17/16, per Dr. Catalina Lunger Mazzocchi.  Patient went from her independent living facility to [facility] ER on 11/14, after caregivers noted changes in her mental status and right sided weakness.  Patient was then [facility] to [facility] where work-up revealed an acute ischemic stroke.    Met with patient and family friend, Mickel Baas, at the bedside.  Patient is not in any distress.  She denies pain.  She makes attempts to communicate with Mickel Baas.  Patient's son, Clair Gulling, is on the way in from [place].    Noted, patient is receiving 0.9% nacl at 50 ml/hour.    Will plan to meet with patient and family on 11/17.  [facility] RN will continue to follow daily and assist as needed.  Efrain Sella, RN, BSN [phone]

## 2016-02-19 NOTE — Progress Notes (Addendum)
PROGRESS NOTE    Nancy [name]  [id number] DOB: Jun 28, 1925 DOA: 02/17/2016 PCP: [name], MD  Brief Narrative:This is an 82-yo woman with h/o dementia who presented to the ED for evaluation of R--sided weakness. She resides in an assistant living facility in the independent section, with caregivers checking in on her several times daily. Caregivers noted that the patient did not seem as sharp as usual 11/14 and noted right-sided weakness. She was taken to the [facility] ED where exam showed a right gaze preference with 3/5 strength in the R arm and leg, no facial droop. She did not withdraw to pain on the right side. MRI of the brain was obtained and showed patchy areas of acute ischemia in the L cerebral hemisphere. She was transferred to [facility] for further evaluation of acute stroke  Assessment & Plan:  Stroke:  Patchy left PCA infarct embolic secondary to known atrial fibrillation  -Obtunded but will answer some questions periodically -MRA  Not ordered -d/w NEurology Dr.Sethi felt poor prognosis recommends Palliative care/Hospice  -Carotid Doppler and ECHO cancelled -continued on ASA -She currently followed by hospice at home in the last 3 weeks -failed swallow eval, NPO recommended, d/w family and decided to pursue D1 diet understanding high aspiration risks for comfort care -discussed residential hospice with son and ex daughter in law -son Comptroller) will arrive this evening from [place] and is agreeable to comfort care  Atrial Fibrillation w/ RVR -continue digoxin and metoprolol -Chads score is 6, or anticoagulation stopped per family in the past  Dementia -known, moderate per family friend   Hypertension -continue metoprolol  Hyperlipidemia -no plans to start statin now  Coronary artery disease  - NSTEMI 07/2015, stable continue ASA/metoprolol  DVT prophylaxis: lovenox Code Status:DNR Family Communication:son via phone and female caregiver( sons exwife) at  bedisde Disposition Plan:CSW and Hospice CSW to work on residential hospice   Consultants:  Neurology    Subjective: Eyes open, mumbles few words  Objective: Vitals:   02/19/16 0200 02/19/16 0614 02/19/16 0932 02/19/16 1037  BP: (!) 160/71 (!) 168/75 (!) 155/85   Pulse: 82 86 94 82  Resp: 17 18 20    Temp: 98.4 F (36.9 C) 98.1 F (36.7 C) 98.1 F (36.7 C)   TempSrc: Oral Oral Oral   SpO2: 99% 96% 94%   Weight:      Height:        Intake/Output Summary (Last 24 hours) at 02/19/16 1309 Last data filed at 02/19/16 0648  Gross per 24 hour  Intake          1042.92 ml  Output                0 ml  Net          1042.92 ml   Filed Weights   02/17/16 1700  Weight: 50.4 kg (111 lb 1.8 oz)    Examination:  General exam: Somnolent, arousible, no distress Respiratory system: Clear to auscultation. Respiratory effort normal. Cardiovascular system: S1 & S2 heard, RRR. No JVD, murmurs, rubs, gallops or clicks. No pedal edema. Gastrointestinal system: Abdomen is nondistended, soft and nontender. . Central nervous system: not oriented,will not follow commands, Mild right hemiparesis, R sided neglect noted Extremities: no edema Skin: No rashes, lesions or ulcers Psychiatry: unable to assess    Data Reviewed: I have personally reviewed following labs and imaging studies  CBC:  Recent Labs Lab 02/17/16 1118 02/17/16 1750  WBC 7.8 10.7*  HGB 14.5 14.5  HCT 42.0 42.4  MCV 93.3 93.6  PLT 287 [id number]   Basic Metabolic Panel:  Recent Labs Lab 02/17/16 1118 02/17/16 1750  NA 139  --   K 4.8  --   CL 101  --   CO2 29  --   GLUCOSE 107*  --   BUN 13  --   CREATININE 1.04* 0.95  CALCIUM 9.5  --    GFR: Estimated Creatinine Clearance: 32.6 mL/min (by C-G formula based on SCr of 0.95 mg/dL). Liver Function Tests:  Recent Labs Lab 02/17/16 1118  AST 45*  ALT 21  ALKPHOS 114  BILITOT 2.5*  PROT 7.4  ALBUMIN 3.7   No results for input(s): LIPASE, AMYLASE in the  last 168 hours. No results for input(s): AMMONIA in the last 168 hours. Coagulation Profile:  Recent Labs Lab 02/17/16 1119  INR 1.15   Cardiac Enzymes: No results for input(s): CKTOTAL, CKMB, CKMBINDEX, TROPONINI in the last 168 hours. BNP (last 3 results) No results for input(s): PROBNP in the last 8760 hours. HbA1C:  Recent Labs  02/18/16 0224  HGBA1C 5.6   CBG:  Recent Labs Lab 02/17/16 1128 02/19/16 0130  GLUCAP 82 93   Lipid Profile:  Recent Labs  02/18/16 0224  CHOL 226*  HDL 46  LDLCALC 162*  TRIG 89  CHOLHDL 4.9   Thyroid Function Tests: No results for input(s): TSH, T4TOTAL, FREET4, T3FREE, THYROIDAB in the last 72 hours. Anemia Panel: No results for input(s): VITAMINB12, FOLATE, FERRITIN, TIBC, IRON, RETICCTPCT in the last 72 hours. Urine analysis:    Component Value Date/Time   COLORURINE AMBER (A) 02/17/2016 1141   APPEARANCEUR CLEAR 02/17/2016 1141   LABSPEC 1.018 02/17/2016 1141   PHURINE 6.5 02/17/2016 1141   GLUCOSEU NEGATIVE 02/17/2016 1141   HGBUR NEGATIVE 02/17/2016 1141   BILIRUBINUR NEGATIVE 02/17/2016 1141   KETONESUR NEGATIVE 02/17/2016 1141   PROTEINUR NEGATIVE 02/17/2016 1141   NITRITE NEGATIVE 02/17/2016 1141   LEUKOCYTESUR NEGATIVE 02/17/2016 1141   Sepsis Labs: @LABRCNTIP (procalcitonin:4,lacticidven:4)  )No results found for this or any previous visit (from the past 240 hour(s)).       Radiology Studies: Mr Brain Wo Contrast  Result Date: 02/17/2016 CLINICAL DATA:  Right-sided weakness EXAM: MRI HEAD WITHOUT CONTRAST TECHNIQUE: Multiplanar, multiecho pulse sequences of the brain and surrounding structures were obtained without intravenous contrast. COMPARISON:  Head CT from earlier today.  Brain MRI 08/18/2015 FINDINGS: Brain: Restricted diffusion in the left PCA territory involving the inferior and parasagittal occipital lobe, periventricular white matter, and posterior left thalamus. 1 cm or smaller DWI hyperintense  foci in the parasagittal left frontal lobe and posterior left frontal white matter, 3:34 and 3:36, are not convincing when correlated with ADC map and could be related to motion artifact. No evidence of superimposed hemorrhage. There is generalized atrophy with moderate mesial temporal volume loss, similar to prior. Chronic microvascular disease with ischemic gliosis confluent the deep cerebral white matter. Prominent dilated perivascular spaces in the deep gray nuclei. Vascular: Grossly preserved flow voids. Skull and upper cervical spine: No focal lesion identified. Sinuses/Orbits: Mild mucosal thickening in the left sphenoid sinus with retained secretions. IMPRESSION: 1. Patchy acute infarct in the left PCA territory, involving the occipital lobe and thalamus. 2. Cannot exclude small foci acute infarction in the left MCA and ACA territories, certainty limited by extensive motion artifact. 3. Atrophy including mesial temporal lobes. Moderate chronic microvascular disease. Electronically Signed   By: Monte Fantasia M.D.   On: 02/17/2016 13:19  Dg Swallowing Func-speech Pathology  Result Date: 02/18/2016 Objective Swallowing Evaluation: Type of Study: MBS-Modified Barium Swallow Study Patient Details Name: Tityana Nudo MRN: [id number] Date of Birth: Sep 21, 1925 Today's Date: 02/18/2016 Time: SLP Start Time (ACUTE ONLY): 1412-SLP Stop Time (ACUTE ONLY): 1428 SLP Time Calculation (min) (ACUTE ONLY): 16 min Past Medical History: Past Medical History: Diagnosis Date . CKD (chronic kidney disease), stage III  . Depression  . Dyslipidemia  . GERD (gastroesophageal reflux disease)  . History of scarlet fever  . Melanoma ([place])  . Migraine  . Mitral regurgitation   a. Echo (11/15):  EF 50-55%, Gr 2 DD, MAC, prolapsing P2 segment of post MV leaflet with mod to possibly severe MR, mild LAE, normal RVF, PASP 35 mmHg;  b. Echo 5/16: Mild LVH, EF 55-60%, trivial AI, moderate MR (LVID, ES 32.3 mm), mild BAE, moderate TR,  PASP 48 c. Echo 07/2015:EF 45-50%, moderate to severe MR  . NSTEMI (non-ST elevated myocardial infarction) ([place]) 07/2015  a. Troponin peak of 7.17. Cath w/ normal cors, thought to be 2ry to vasospasm . PAF (paroxysmal atrial fibrillation) ([place])   a. On Coumadin Past Surgical History: Past Surgical History: Procedure Laterality Date . APPENDECTOMY   . CARDIAC CATHETERIZATION N/A 07/21/2015  Procedure: Left Heart Cath and Coronary Angiography;  Surgeon: Lorretta Harp, MD;  Location: [place] [facility];  Service: Cardiovascular;  Laterality: N/A; . CATARACT EXTRACTION    bilateral . CESAREAN SECTION   . SKIN BIOPSY   . TONSILLECTOMY   HPI: 82 year old female with past medical history of mild to moderate dementia from SNF who presents with altered mental status, right sided arm and leg weakness. MRI patchy acute infarct in the left PCA territory, involving the occipital lobe and thalamus, cannot exclude small foci acute infarction in the left MCA and ACA territories, certainty limited by extensive motion artifact. No Data Recorded Assessment / Plan / Recommendation CHL IP CLINICAL IMPRESSIONS 02/18/2016 Therapy Diagnosis Severe oral phase dysphagia;Moderate oral phase dysphagia;Severe pharyngeal phase dysphagia Clinical Impression Pt exhibited mod-severe oral and severe pharyngeal dysphagia resulting in delayed oral transit up to 1-3 minutes exacerbated by aphasia and cognitive impairments (poor attentin). Premature spill with most consistencies. Silent and frank aspiration with nectar thick during the swallow. Max vallecuar and mild-mod pyriform sinsuses with all consistencies. She is unable to perform compensatory strategies and is at high risk for aspiration with all consistencies at present. Spoke with pt's former daughter-in-law and pt's MD and thought is pt may have comfort care with comfort feeds with known aspiration risks. Discussed with MD and decided to hold on ordering comfort care diet until pt's son and  POA arrives next date.    Impact on safety and function Severe aspiration risk   CHL IP TREATMENT RECOMMENDATION 02/18/2016 Treatment Recommendations Therapy as outlined in treatment plan below   Prognosis 02/18/2016 Prognosis for Safe Diet Advancement Fair Barriers to Reach Goals Cognitive deficits Barriers/Prognosis Comment -- CHL IP DIET RECOMMENDATION 02/18/2016 SLP Diet Recommendations NPO Liquid Administration via -- Medication Administration -- Compensations -- Postural Changes --   CHL IP OTHER RECOMMENDATIONS 02/18/2016 Recommended Consults -- Oral Care Recommendations Oral care BID Other Recommendations --   CHL IP FOLLOW UP RECOMMENDATIONS 02/18/2016 Follow up Recommendations Skilled Nursing facility   [facility] IP FREQUENCY AND DURATION 02/18/2016 Speech Therapy Frequency (ACUTE ONLY) min 1 x/week Treatment Duration 1 week      CHL IP ORAL PHASE 02/18/2016 Oral Phase Impaired Oral - Pudding Teaspoon -- Oral - Pudding  Cup -- Oral - Honey Teaspoon -- Oral - Honey Cup Delayed oral transit;Holding of bolus;Premature spillage Oral - Nectar Teaspoon -- Oral - Nectar Cup Delayed oral transit;Holding of bolus;Premature spillage Oral - Nectar Straw -- Oral - Thin Teaspoon -- Oral - Thin Cup -- Oral - Thin Straw -- Oral - Puree Holding of bolus;Delayed oral transit Oral - Mech Soft -- Oral - Regular -- Oral - Multi-Consistency -- Oral - Pill -- Oral Phase - Comment --  CHL IP PHARYNGEAL PHASE 02/18/2016 Pharyngeal Phase Impaired Pharyngeal- Pudding Teaspoon -- Pharyngeal -- Pharyngeal- Pudding Cup -- Pharyngeal -- Pharyngeal- Honey Teaspoon -- Pharyngeal -- Pharyngeal- Honey Cup Pharyngeal residue - valleculae;Pharyngeal residue - pyriform;Reduced tongue base retraction;Reduced laryngeal elevation Pharyngeal -- Pharyngeal- Nectar Teaspoon -- Pharyngeal -- Pharyngeal- Nectar Cup Penetration/Aspiration during swallow;Pharyngeal residue - valleculae;Pharyngeal residue - pyriform;Reduced laryngeal elevation;Reduced tongue  base retraction Pharyngeal Material enters airway, passes BELOW cords without attempt by patient to eject out (silent aspiration) Pharyngeal- Nectar Straw -- Pharyngeal -- Pharyngeal- Thin Teaspoon -- Pharyngeal -- Pharyngeal- Thin Cup -- Pharyngeal -- Pharyngeal- Thin Straw -- Pharyngeal -- Pharyngeal- Puree Pharyngeal residue - valleculae;Pharyngeal residue - pyriform;Reduced tongue base retraction;Reduced laryngeal elevation Pharyngeal -- Pharyngeal- Mechanical Soft -- Pharyngeal -- Pharyngeal- Regular -- Pharyngeal -- Pharyngeal- Multi-consistency -- Pharyngeal -- Pharyngeal- Pill -- Pharyngeal -- Pharyngeal Comment --  CHL IP CERVICAL ESOPHAGEAL PHASE 02/18/2016 Cervical Esophageal Phase WFL Pudding Teaspoon -- Pudding Cup -- Honey Teaspoon -- Honey Cup -- Nectar Teaspoon -- Nectar Cup -- Nectar Straw -- Thin Teaspoon -- Thin Cup -- Thin Straw -- Puree -- Mechanical Soft -- Regular -- Multi-consistency -- Pill -- Cervical Esophageal Comment -- No flowsheet data found. [place] Siren 02/18/2016, 4:00 PM  Orbie Pyo Colvin Caroli.Ed [employer] Pager [phone]                  Scheduled Meds: . aspirin  81 mg Oral Daily  . chlorhexidine  15 mL Mouth Rinse BID  . digoxin  0.125 mg Oral Daily  . enoxaparin (LOVENOX) injection  30 mg Subcutaneous Q24H  . mouth rinse  15 mL Mouth Rinse q12n4p  . metoprolol tartrate  37.5 mg Oral BID   Continuous Infusions: . sodium chloride 50 mL/hr at 02/19/16 0648     LOS: 2 days    Time spent: 51min    Domenic Polite, MD [facility] Pager [phone]  If 7PM-7AM, please contact night-coverage [email or web address] Password [email or web address] 02/19/2016, 1:09 PM

## 2016-02-19 NOTE — Discharge Summary (Signed)
Physician Discharge Summary  Nancy [name] ZHY:[id number] DOB: October 06, 1925 DOA: 02/17/2016  PCP: Gwen Pounds, MD  Admit date: 02/17/2016 Discharge date: 02/19/2016  Time spent: 45 minutes  Recommendations for Outpatient Follow-up:  1. Discharge to Hospice facility for Comfort care  Discharge Diagnoses:  Active Problems:   CVA (cerebral vascular accident) (HCC)   Stroke (cerebrum) (HCC)   Dysphagia  Severe malnutrition  Dementia  Afib with RVR  Hypertension  Discharge Condition: poor  Diet recommendation: Dysphagia 1 diet, thin liquids/comfort feeds  Filed Weights   02/17/16 1700  Weight: 50.4 kg (111 lb 1.8 oz)    History of present illness:  This is an 82-yo woman with h/o dementia who presented to the ED for evaluation of R--sided weakness.  Caregivers noted that the patient did not seem as sharp as usual 11/14 and noted right-sided weakness. She was taken to the [facility] ED where exam showed a right gaze preference with 3/5 strength in the R arm and leg. MRI of the brain was obtained and showed patchy areas of acute ischemia in the L cerebral hemisphere. She was transferred to [facility] for further evaluation of acute stroke  Hospital Course:  Stroke: Patchy left PCAinfarct embolic secondary to known atrial fibrillation  -Obtunded but will answer some questions periodically -MRANot ordered - NEurology consulted, d/w Dr.Sethi felt poor prognosis recommends Palliative care/Hospice  -CarotidDopplerand ECHO cancelled -continued on ASA -She currently followed by hospice at home in the last 3 weeks prior to admission -now she failed swallow eval, NPO recommended per Speech pathologist, I called son who is PoA( resides in [place]) and recommended comfort care and comfort feeds with Dysphagia 1 diet understanding high aspiration risks for comfort based care, son understands and is agreeable -discussed residential hospice with son and ex daughter in law, awaiting  placement.  Atrial Fibrillation w/ RVR -continue digoxin and metoprolol, HR improved -Chads score is 6, or anticoagulation stopped per family in the past  Dementia -known, moderate per family friend, on home hospice  Hypertension -continue metoprolol  Hyperlipidemia -no plans to start statin now  Coronary artery disease - NSTEMI 07/2015, stable continue ASA/metoprolol  Consultations:  Neurology  Discharge Exam: Vitals:   02/19/16 1824 02/19/16 2103  BP: (!) 145/73 135/78  Pulse: 86 88  Resp: 16 18  Temp: 97.8 F (36.6 C) 97.4 F (36.3 C)    General: awake, mumbles, not oriented Cardiovascular: S1S2/RRR Respiratory: ronchi at bases  Discharge Instructions    Current Discharge Medication List    START taking these medications   Details  senna-docusate (SENOKOT-S) 8.6-50 MG tablet Take 1 tablet by mouth at bedtime as needed for mild constipation.      CONTINUE these medications which have CHANGED   Details  digoxin 62.5 MCG TABS Take 0.0625 mg by mouth daily.      CONTINUE these medications which have NOT CHANGED   Details  aspirin EC 81 MG EC tablet Take 1 tablet (81 mg total) by mouth daily.    metoprolol tartrate 37.5 MG TABS Take 37.5 mg by mouth 2 (two) times daily. Qty: 60 tablet, Refills: 1    nitroGLYCERIN (NITROSTAT) 0.4 MG SL tablet Place 1 tablet (0.4 mg total) under the tongue every 5 (five) minutes x 3 doses as needed for chest pain. Qty: 25 tablet, Refills: 3       No Known Allergies    The results of significant diagnostics from this hospitalization (including imaging, microbiology, ancillary and laboratory) are listed below for  reference.    Significant Diagnostic Studies: Dg Chest 2 View  Result Date: 02/17/2016 CLINICAL DATA:  Right-sided weakness. EXAM: CHEST  2 VIEW COMPARISON:  Chest x-ray 11/23/2015, lumbar spine 11/24/2015 FINDINGS: Mild hyperinflation. Negative for pneumonia. Negative for heart failure or effusion.  Severe compression fracture L1 has progressed since the prior study of 11/24/2015. IMPRESSION: Pulmonary hyperinflation.  No acute cardiopulmonary abnormality Severe compression fracture L1 with significant progression since 11/24/2015 Electronically Signed   By: Marlan Palau M.D.   On: 02/17/2016 12:45   Ct Head Wo Contrast  Result Date: 02/17/2016 CLINICAL DATA:  Altered mental status. EXAM: CT HEAD WITHOUT CONTRAST TECHNIQUE: Contiguous axial images were obtained from the base of the skull through the vertex without intravenous contrast. COMPARISON:  CT scan of November 23, 2015. FINDINGS: Brain: Mild diffuse cortical atrophy is noted. Mild chronic ischemic white matter disease is noted. No mass effect or midline shift is noted. Ventricular size is within normal limits. There is no evidence of mass lesion, hemorrhage or acute infarction. Vascular: Atherosclerosis of carotid siphons is noted. Skull: Bony calvarium appears intact. Sinuses/Orbits: Visualized paranasal sinuses appear normal. Other: None. IMPRESSION: Mild diffuse cortical atrophy. Mild chronic ischemic white matter disease. No acute intracranial abnormality seen. Electronically Signed   By: Lupita Raider, M.D.   On: 02/17/2016 12:02   Mr Brain Wo Contrast  Result Date: 02/17/2016 CLINICAL DATA:  Right-sided weakness EXAM: MRI HEAD WITHOUT CONTRAST TECHNIQUE: Multiplanar, multiecho pulse sequences of the brain and surrounding structures were obtained without intravenous contrast. COMPARISON:  Head CT from earlier today.  Brain MRI 08/18/2015 FINDINGS: Brain: Restricted diffusion in the left PCA territory involving the inferior and parasagittal occipital lobe, periventricular white matter, and posterior left thalamus. 1 cm or smaller DWI hyperintense foci in the parasagittal left frontal lobe and posterior left frontal white matter, 3:34 and 3:36, are not convincing when correlated with ADC map and could be related to motion artifact. No  evidence of superimposed hemorrhage. There is generalized atrophy with moderate mesial temporal volume loss, similar to prior. Chronic microvascular disease with ischemic gliosis confluent the deep cerebral white matter. Prominent dilated perivascular spaces in the deep gray nuclei. Vascular: Grossly preserved flow voids. Skull and upper cervical spine: No focal lesion identified. Sinuses/Orbits: Mild mucosal thickening in the left sphenoid sinus with retained secretions. IMPRESSION: 1. Patchy acute infarct in the left PCA territory, involving the occipital lobe and thalamus. 2. Cannot exclude small foci acute infarction in the left MCA and ACA territories, certainty limited by extensive motion artifact. 3. Atrophy including mesial temporal lobes. Moderate chronic microvascular disease. Electronically Signed   By: Marnee Spring M.D.   On: 02/17/2016 13:19   Dg Swallowing Func-speech Pathology  Result Date: 02/18/2016 Objective Swallowing Evaluation: Type of Study: MBS-Modified Barium Swallow Study Patient Details Name: Nancy [name] MRN: [id number] Date of Birth: 07/17/1925 Today's Date: 02/18/2016 Time: SLP Start Time (ACUTE ONLY): 1412-SLP Stop Time (ACUTE ONLY): 1428 SLP Time Calculation (min) (ACUTE ONLY): 16 min Past Medical History: Past Medical History: Diagnosis Date . CKD (chronic kidney disease), stage III  . Depression  . Dyslipidemia  . GERD (gastroesophageal reflux disease)  . History of scarlet fever  . Melanoma (HCC)  . Migraine  . Mitral regurgitation   a. Echo (11/15):  EF 50-55%, Gr 2 DD, MAC, prolapsing P2 segment of post MV leaflet with mod to possibly severe MR, mild LAE, normal RVF, PASP 35 mmHg;  b. Echo 5/16: Mild LVH, EF 55-60%, trivial  AI, moderate MR (LVID, ES 32.3 mm), mild BAE, moderate TR, PASP 48 c. Echo 07/2015:EF 45-50%, moderate to severe MR  . NSTEMI (non-ST elevated myocardial infarction) (HCC) 07/2015  a. Troponin peak of 7.17. Cath w/ normal cors, thought to be 2ry to  vasospasm . PAF (paroxysmal atrial fibrillation) (HCC)   a. On Coumadin Past Surgical History: Past Surgical History: Procedure Laterality Date . APPENDECTOMY   . CARDIAC CATHETERIZATION N/A 07/21/2015  Procedure: Left Heart Cath and Coronary Angiography;  Surgeon: Runell Gess, MD;  [facility] INVASIVE CV LAB;  Service: Cardiovascular;  Laterality: N/A; . CATARACT EXTRACTION    bilateral . CESAREAN SECTION   . SKIN BIOPSY   . TONSILLECTOMY   HPI: 82 year old female with past medical history of mild to moderate dementia from SNF who presents with altered mental status, right sided arm and leg weakness. MRI patchy acute infarct in the left PCA territory, involving the occipital lobe and thalamus, cannot exclude small foci acute infarction in the left MCA and ACA territories, certainty limited by extensive motion artifact. No Data Recorded Assessment / Plan / Recommendation CHL IP CLINICAL IMPRESSIONS 02/18/2016 Therapy Diagnosis Severe oral phase dysphagia;Moderate oral phase dysphagia;Severe pharyngeal phase dysphagia Clinical Impression Pt exhibited mod-severe oral and severe pharyngeal dysphagia resulting in delayed oral transit up to 1-3 minutes exacerbated by aphasia and cognitive impairments (poor attentin). Premature spill with most consistencies. Silent and frank aspiration with nectar thick during the swallow. Max vallecuar and mild-mod pyriform sinsuses with all consistencies. She is unable to perform compensatory strategies and is at high risk for aspiration with all consistencies at present. Spoke with pt's former daughter-in-law and pt's MD and thought is pt may have comfort care with comfort feeds with known aspiration risks. Discussed with MD and decided to hold on ordering comfort care diet until pt's son and POA arrives next date.    Impact on safety and function Severe aspiration risk   CHL IP TREATMENT RECOMMENDATION 02/18/2016 Treatment Recommendations Therapy as outlined in treatment plan  below   Prognosis 02/18/2016 Prognosis for Safe Diet Advancement Fair Barriers to Reach Goals Cognitive deficits Barriers/Prognosis Comment -- CHL IP DIET RECOMMENDATION 02/18/2016 SLP Diet Recommendations NPO Liquid Administration via -- Medication Administration -- Compensations -- Postural Changes --   CHL IP OTHER RECOMMENDATIONS 02/18/2016 Recommended Consults -- Oral Care Recommendations Oral care BID Other Recommendations --   CHL IP FOLLOW UP RECOMMENDATIONS 02/18/2016 Follow up Recommendations Skilled Nursing facility   [facility] IP FREQUENCY AND DURATION 02/18/2016 Speech Therapy Frequency (ACUTE ONLY) min 1 x/week Treatment Duration 1 week      CHL IP ORAL PHASE 02/18/2016 Oral Phase Impaired Oral - Pudding Teaspoon -- Oral - Pudding Cup -- Oral - Honey Teaspoon -- Oral - Honey Cup Delayed oral transit;Holding of bolus;Premature spillage Oral - Nectar Teaspoon -- Oral - Nectar Cup Delayed oral transit;Holding of bolus;Premature spillage Oral - Nectar Straw -- Oral - Thin Teaspoon -- Oral - Thin Cup -- Oral - Thin Straw -- Oral - Puree Holding of bolus;Delayed oral transit Oral - Mech Soft -- Oral - Regular -- Oral - Multi-Consistency -- Oral - Pill -- Oral Phase - Comment --  CHL IP PHARYNGEAL PHASE 02/18/2016 Pharyngeal Phase Impaired Pharyngeal- Pudding Teaspoon -- Pharyngeal -- Pharyngeal- Pudding Cup -- Pharyngeal -- Pharyngeal- Honey Teaspoon -- Pharyngeal -- Pharyngeal- Honey Cup Pharyngeal residue - valleculae;Pharyngeal residue - pyriform;Reduced tongue base retraction;Reduced laryngeal elevation Pharyngeal -- Pharyngeal- Nectar Teaspoon -- Pharyngeal -- Pharyngeal- Nectar Cup Penetration/Aspiration during  swallow;Pharyngeal residue - valleculae;Pharyngeal residue - pyriform;Reduced laryngeal elevation;Reduced tongue base retraction Pharyngeal Material enters airway, passes BELOW cords without attempt by patient to eject out (silent aspiration) Pharyngeal- Nectar Straw -- Pharyngeal -- Pharyngeal- Thin  Teaspoon -- Pharyngeal -- Pharyngeal- Thin Cup -- Pharyngeal -- Pharyngeal- Thin Straw -- Pharyngeal -- Pharyngeal- Puree Pharyngeal residue - valleculae;Pharyngeal residue - pyriform;Reduced tongue base retraction;Reduced laryngeal elevation Pharyngeal -- Pharyngeal- Mechanical Soft -- Pharyngeal -- Pharyngeal- Regular -- Pharyngeal -- Pharyngeal- Multi-consistency -- Pharyngeal -- Pharyngeal- Pill -- Pharyngeal -- Pharyngeal Comment --  CHL IP CERVICAL ESOPHAGEAL PHASE 02/18/2016 Cervical Esophageal Phase WFL Pudding Teaspoon -- Pudding Cup -- Honey Teaspoon -- Honey Cup -- Nectar Teaspoon -- Nectar Cup -- Nectar Straw -- Thin Teaspoon -- Thin Cup -- Thin Straw -- Puree -- Mechanical Soft -- Regular -- Multi-consistency -- Pill -- Cervical Esophageal Comment -- No flowsheet data found. Royce Macadamia 02/18/2016, 4:00 PM  Breck Coons Lonell Face.Ed CCC-SLP Pager [phone]              Microbiology: No results found for this or any previous visit (from the past 240 hour(s)).   Labs: Basic Metabolic Panel:  Recent Labs Lab 02/17/16 1118 02/17/16 1750  NA 139  --   K 4.8  --   CL 101  --   CO2 29  --   GLUCOSE 107*  --   BUN 13  --   CREATININE 1.04* 0.95  CALCIUM 9.5  --    Liver Function Tests:  Recent Labs Lab 02/17/16 1118  AST 45*  ALT 21  ALKPHOS 114  BILITOT 2.5*  PROT 7.4  ALBUMIN 3.7   No results for input(s): LIPASE, AMYLASE in the last 168 hours. No results for input(s): AMMONIA in the last 168 hours. CBC:  Recent Labs Lab 02/17/16 1118 02/17/16 1750  WBC 7.8 10.7*  HGB 14.5 14.5  HCT 42.0 42.4  MCV 93.3 93.6  PLT 287 274   Cardiac Enzymes: No results for input(s): CKTOTAL, CKMB, CKMBINDEX, TROPONINI in the last 168 hours. BNP: BNP (last 3 results)  Recent Labs  08/30/15 1020 09/24/15 1600 09/28/15 1155  BNP 1,433.0* 1,347.3* 787.3*    ProBNP (last 3 results) No results for input(s): PROBNP in the last 8760 hours.  CBG:  Recent Labs Lab  02/17/16 1128 02/19/16 0130  GLUCAP 82 93       Signed:  Zakaiya Lares MD.  [facility] 02/19/2016, 11:07 PM

## 2016-02-19 NOTE — Progress Notes (Signed)
Occupational Therapy Evaluation Patient Details Name: Nancy [name] MRN: [id number] DOB: [birth year] Today's Date: 02/19/2016    History of Present Illness HPI 82 year old female with past medical history of mild to moderate dementia who presents with altered mental status. Per report from nursing facility, the patient is alert and oriented at baseline to person and place but not time. She is normally ambulatory with a walker. She was normal night of 11/13 upon going to bed. Upon awakening this morning, patient was noted to have right sided arm and leg weakness. MR brain shows.   Patchy acute infarct in the left PCA territory, involving the occipital lobe and thalamus. Cannot exclude small foci acute infarction in the left MCA and ACA territories.   Clinical Impression   PTA, pt lived at ALF and was independent with mobility and ADL with assistance for medication management and meals. Pt lethargic during evaluation. Alertness increased when pt sat EOB but demonstrated a L gaze preference. Appears to have a R field cut.  Did not follow commands. Moving LUE  Spontaneously but not functionally at this time. Total A with all mobility. No righting reactions in sitting.  Apparently recognized family and verbalized "hey" to her grand daughter. Family states pt did not sleep well last night. Pt will need to DC to SNF for rehab. Will follow acutely as appropriate.     Follow Up Recommendations  SNF;Supervision/Assistance - 24 hour    Equipment Recommendations  None recommended by OT    Recommendations for Other Services       Precautions / Restrictions Precautions Precautions: Fall      Mobility Bed Mobility Overal bed mobility: Needs Assistance Bed Mobility: Supine to Sit;Sit to Supine Rolling: Total assist   Supine to sit: Total assist        Transfers                 General transfer comment: NT     Balance Overall balance assessment: Needs assistance   Sitting  balance-Leahy Scale: Zero                                      ADL Overall ADL's : Needs assistance/impaired                                     Functional mobility during ADLs: Total assistance General ADL Comments: Total A for all ADL     Vision Vision Assessment?: Vision impaired- to be further tested in functional context Additional Comments: L gaze preference   Perception  impaired  - to be further assessed.    Praxis Praxis Praxis tested?: Deficits Praxis-Other Comments: poor ionitiation. Will further assess    Pertinent Vitals/Pain Pain Assessment: Faces Faces Pain Scale: Hurts little more Pain Location: ? rubbing stomach Pain Descriptors / Indicators: Grimacing Pain Intervention(s): Limited activity within patient's tolerance     Hand Dominance Left   Extremity/Trunk Assessment Upper Extremity Assessment Upper Extremity Assessment: RUE deficits/detail RUE Deficits / Details: minimal movement observed. Resisted movement. Not moving functionally when sitting   Lower Extremity Assessment Lower Extremity Assessment: Defer to PT evaluation   Cervical / Trunk Assessment Cervical / Trunk Assessment: Kyphotic   Communication Communication Communication: Expressive difficulties   Cognition Arousal/Alertness: Lethargic Behavior During Therapy: Flat affect;Restless Overall Cognitive Status: Impaired/Different from baseline  General Comments: not following any commands.    General Comments   Family present during assessment and very involved with pt care    Exercises       Shoulder Instructions      Home Living Family/patient expects to be discharged to:: Skilled nursing facility                                        Prior Functioning/Environment Level of Independence: Independent with assistive device(s)        Comments: lived at ALF and was active with her family, getting  manicures, going to lunch,etc.         OT Problem List: Decreased strength;Decreased range of motion;Decreased activity tolerance;Impaired balance (sitting and/or standing);Impaired vision/perception;Decreased coordination;Decreased cognition;Decreased safety awareness;Decreased knowledge of use of DME or AE;Decreased knowledge of precautions;Impaired sensation;Impaired UE functional use;Pain   OT Treatment/Interventions: Self-care/ADL training;Therapeutic exercise;Neuromuscular education;DME and/or AE instruction;Therapeutic activities;Cognitive remediation/compensation;Visual/perceptual remediation/compensation;Patient/family education;Balance training    OT Goals(Current goals can be found in the care plan section) Acute Rehab OT Goals Patient Stated Goal: patient unable  OT Goal Formulation: With family Time For Goal Achievement: 03/04/16 Potential to Achieve Goals: Fair  OT Frequency: Min 2X/week   Barriers to D/C:            Co-evaluation              End of Session Nurse Communication: Mobility status  Activity Tolerance: Patient limited by fatigue;Patient limited by lethargy Patient left: in bed;with call bell/phone within reach;with family/visitor present   Time: 1455-1530 OT Time Calculation (min): 35 min Charges:  OT General Charges $OT Visit: 1 Procedure OT Evaluation $OT Eval Moderate Complexity: 1 Procedure OT Treatments $Self Care/Home Management : 8-22 mins G-Codes:    Kambryn Dapolito,HILLARY 2016-02-24, 3:40 PM   [facility], OTR/L  [phone] 24-Feb-2016

## 2016-02-19 NOTE — Clinical Social Work Note (Signed)
Clinical Social Work Assessment  Patient Details  Name: Nancy [name] MRN: [id number] Date of Birth: Dec 10, 1925  Date of referral:  02/19/16               Reason for consult:  Emotional/Coping/Adjustment to Illness, End of Life/Hospice                Permission sought to share information with:  Case Manager, Customer service manager, Family Supports Permission granted to share information::  Yes, Release of Information Signed  Name::        Agency::  SNF [place],   Relationship::  Nancy [name] (POA) son  Contact Information:     Housing/Transportation Living arrangements for the past 2 months:  [place] ([facility]) Source of Information:  Medical Team, Adult Children Patient Interpreter Needed:  None Criminal Activity/Legal Involvement Pertinent to Current Situation/Hospitalization:  No - Comment as needed Significant Relationships:  Adult Children, Other Family Members, Community Support Lives with:  Facility Resident Do you feel safe going back to the place where you live?  No Need for family participation in patient care:  Yes (Comment)  Care giving concerns:  Patient has an ex-daughter in law at the bedside whom LCSW brielf spoke with to understand when son would be coming in from town.  Patient has been living in ILF since the first of the year with hospice following. Up until this admission patient was independent and daughter in law (Nancy [name]/Nancy [name]) showed pictures of patient with her grandchildren and out in the community.  She shared that patient would not be able to return to ILF due to rapid decline in health. Patient currently has mitts on hands and resting comfortably. She did not awake during time in room. Son called while LCSW in room. He is coming in from out of town and will be here late this evening and here tomorrow for a goals of care meeting.  Social Worker assessment / plan:  LCSW received consult from MD regarding goals of  care/residental hospice placement. Call placed to [facility] regarding involvement with hospice and notify of change in plans by son who is home with his sick daughter. Son is questioning SNF with hospice vs residential hospice.   Will need a goals of care meeting as there are conflicting answers with regards to plans for patient. Will be available and assist with overall outcome of meeting and disposition. Plan: TBD  Employment status:  Retired Forensic scientist:  Medicare PT Recommendations:  [place] / Referral to community resources:  Other (Comment Required) (goals of care)  Patient/Family's Response to care:  Agreeable to plan  Patient/Family's Understanding of and Emotional Response to Diagnosis, Current Treatment, and Prognosis:  Unclear if family and son are understanding of patient's current prognosis and plans for treatment.  Emotional Assessment Appearance:  Appears stated age Attitude/Demeanor/Rapport:  Unable to Assess, Unresponsive Affect (typically observed):  Quiet, Calm Orientation:  Fluctuating Orientation (Suspected and/or reported Sundowners) Alcohol / Substance use:  Not Applicable Psych involvement (Current and /or in the community):  No (Comment)  Discharge Needs  Concerns to be addressed:  No discharge needs identified Readmission within the last 30 days:  No Current discharge risk:  None Barriers to Discharge:  Continued Medical Work up (determining goals of care)   Nancy Cove, LCSW 02/19/2016, 11:38 AM

## 2016-02-19 NOTE — Care Management Note (Signed)
Case Management Note  Patient Details  Name: Nancy [name] MRN: [id number] Date of Birth: [birth year]  Subjective/Objective:   Pt is from [place]. Pt admitted with CVA.                  Action/Plan: Hospice meeting with family on 02/20/2016. CM following for discharge disposition.   Expected Discharge Date:   (Pending)               Expected Discharge Plan:     In-House Referral:     Discharge planning Services     Post Acute Care Choice:    Choice offered to:     DME Arranged:    DME Agency:     HH Arranged:    HH Agency:     Status of Service:     If discussed at [employer] of Stay Meetings, dates discussed:    Additional Comments:  Pollie Friar, RN 02/19/2016, 3:45 PM

## 2016-02-20 DIAGNOSIS — S199XXA Unspecified injury of neck, initial encounter: Secondary | ICD-10-CM | POA: Diagnosis not present

## 2016-02-20 DIAGNOSIS — Y999 Unspecified external cause status: Secondary | ICD-10-CM | POA: Diagnosis not present

## 2016-02-20 DIAGNOSIS — M6281 Muscle weakness (generalized): Secondary | ICD-10-CM | POA: Diagnosis not present

## 2016-02-20 DIAGNOSIS — N183 Chronic kidney disease, stage 3 (moderate): Secondary | ICD-10-CM | POA: Diagnosis not present

## 2016-02-20 DIAGNOSIS — F0150 Vascular dementia without behavioral disturbance: Secondary | ICD-10-CM | POA: Diagnosis not present

## 2016-02-20 DIAGNOSIS — F0281 Dementia in other diseases classified elsewhere with behavioral disturbance: Secondary | ICD-10-CM | POA: Diagnosis not present

## 2016-02-20 DIAGNOSIS — Y939 Activity, unspecified: Secondary | ICD-10-CM | POA: Diagnosis not present

## 2016-02-20 DIAGNOSIS — I482 Chronic atrial fibrillation: Secondary | ICD-10-CM | POA: Diagnosis not present

## 2016-02-20 DIAGNOSIS — Y92410 Unspecified street and highway as the place of occurrence of the external cause: Secondary | ICD-10-CM | POA: Diagnosis not present

## 2016-02-20 DIAGNOSIS — I25810 Atherosclerosis of coronary artery bypass graft(s) without angina pectoris: Secondary | ICD-10-CM | POA: Diagnosis not present

## 2016-02-20 DIAGNOSIS — I6310 Cerebral infarction due to embolism of unspecified precerebral artery: Secondary | ICD-10-CM

## 2016-02-20 DIAGNOSIS — S0990XA Unspecified injury of head, initial encounter: Secondary | ICD-10-CM | POA: Diagnosis not present

## 2016-02-20 DIAGNOSIS — M79604 Pain in right leg: Secondary | ICD-10-CM | POA: Diagnosis not present

## 2016-02-20 DIAGNOSIS — I638 Other cerebral infarction: Secondary | ICD-10-CM | POA: Diagnosis not present

## 2016-02-20 DIAGNOSIS — R1319 Other dysphagia: Secondary | ICD-10-CM | POA: Diagnosis not present

## 2016-02-20 DIAGNOSIS — Z8673 Personal history of transient ischemic attack (TIA), and cerebral infarction without residual deficits: Secondary | ICD-10-CM | POA: Diagnosis not present

## 2016-02-20 DIAGNOSIS — I639 Cerebral infarction, unspecified: Secondary | ICD-10-CM | POA: Diagnosis not present

## 2016-02-20 DIAGNOSIS — W1839XA Other fall on same level, initial encounter: Secondary | ICD-10-CM | POA: Diagnosis not present

## 2016-02-20 DIAGNOSIS — I2782 Chronic pulmonary embolism: Secondary | ICD-10-CM | POA: Diagnosis not present

## 2016-02-20 DIAGNOSIS — R259 Unspecified abnormal involuntary movements: Secondary | ICD-10-CM | POA: Diagnosis not present

## 2016-02-20 DIAGNOSIS — I4891 Unspecified atrial fibrillation: Secondary | ICD-10-CM | POA: Diagnosis not present

## 2016-02-20 DIAGNOSIS — I252 Old myocardial infarction: Secondary | ICD-10-CM | POA: Diagnosis not present

## 2016-02-20 DIAGNOSIS — R9431 Abnormal electrocardiogram [ECG] [EKG]: Secondary | ICD-10-CM | POA: Diagnosis not present

## 2016-02-20 DIAGNOSIS — N184 Chronic kidney disease, stage 4 (severe): Secondary | ICD-10-CM | POA: Diagnosis not present

## 2016-02-20 DIAGNOSIS — I2510 Atherosclerotic heart disease of native coronary artery without angina pectoris: Secondary | ICD-10-CM | POA: Diagnosis not present

## 2016-02-20 DIAGNOSIS — E785 Hyperlipidemia, unspecified: Secondary | ICD-10-CM | POA: Diagnosis not present

## 2016-02-20 DIAGNOSIS — I480 Paroxysmal atrial fibrillation: Secondary | ICD-10-CM | POA: Diagnosis not present

## 2016-02-20 DIAGNOSIS — I509 Heart failure, unspecified: Secondary | ICD-10-CM | POA: Diagnosis not present

## 2016-02-20 DIAGNOSIS — F0390 Unspecified dementia without behavioral disturbance: Secondary | ICD-10-CM | POA: Diagnosis not present

## 2016-02-20 DIAGNOSIS — Z7982 Long term (current) use of aspirin: Secondary | ICD-10-CM | POA: Diagnosis not present

## 2016-02-20 DIAGNOSIS — Z87891 Personal history of nicotine dependence: Secondary | ICD-10-CM | POA: Diagnosis not present

## 2016-02-20 DIAGNOSIS — J449 Chronic obstructive pulmonary disease, unspecified: Secondary | ICD-10-CM | POA: Diagnosis not present

## 2016-02-20 DIAGNOSIS — I5043 Acute on chronic combined systolic (congestive) and diastolic (congestive) heart failure: Secondary | ICD-10-CM | POA: Diagnosis not present

## 2016-02-20 DIAGNOSIS — R278 Other lack of coordination: Secondary | ICD-10-CM | POA: Diagnosis not present

## 2016-02-20 DIAGNOSIS — S064X0A Epidural hemorrhage without loss of consciousness, initial encounter: Secondary | ICD-10-CM | POA: Diagnosis not present

## 2016-02-20 DIAGNOSIS — S098XXA Other specified injuries of head, initial encounter: Secondary | ICD-10-CM | POA: Diagnosis not present

## 2016-02-20 DIAGNOSIS — I672 Cerebral atherosclerosis: Secondary | ICD-10-CM | POA: Diagnosis not present

## 2016-02-20 DIAGNOSIS — Z7901 Long term (current) use of anticoagulants: Secondary | ICD-10-CM | POA: Diagnosis not present

## 2016-02-20 DIAGNOSIS — R1312 Dysphagia, oropharyngeal phase: Secondary | ICD-10-CM | POA: Diagnosis not present

## 2016-02-20 DIAGNOSIS — S0003XA Contusion of scalp, initial encounter: Secondary | ICD-10-CM | POA: Diagnosis not present

## 2016-02-20 DIAGNOSIS — I130 Hypertensive heart and chronic kidney disease with heart failure and stage 1 through stage 4 chronic kidney disease, or unspecified chronic kidney disease: Secondary | ICD-10-CM | POA: Diagnosis not present

## 2016-02-20 DIAGNOSIS — R945 Abnormal results of liver function studies: Secondary | ICD-10-CM | POA: Diagnosis not present

## 2016-02-20 DIAGNOSIS — I10 Essential (primary) hypertension: Secondary | ICD-10-CM | POA: Diagnosis not present

## 2016-02-20 NOTE — Clinical Social Work Placement (Signed)
   CLINICAL SOCIAL WORK PLACEMENT  NOTE  Date:  02/20/2016  Patient Details  Name: Nancy [name] MRN: [id number] Date of Birth: [birth year]  Clinical Social Work is seeking post-discharge placement for this patient at the [place] level of care (*CSW will initial, date and re-position this form in  chart as items are completed):  Yes   Patient/family provided with [facility] Work Department's list of facilities offering this level of care within the geographic area requested by the patient (or if unable, by the patient's family).  Yes   Patient/family informed of their freedom to choose among providers that offer the needed level of care, that participate in Medicare, Medicaid or managed care program needed by the patient, have an available bed and are willing to accept the patient.  Yes   Patient/family informed of [facility]'s ownership interest in [facility] and [facility], as well as of the fact that they are under no obligation to receive care at these facilities.  PASRR submitted to EDS on 02/20/16     PASRR number received on 02/20/16     Existing PASRR number confirmed on       FL2 transmitted to all facilities in geographic area requested by pt/family on 02/20/16     FL2 transmitted to all facilities within larger geographic area on       Patient informed that his/her managed care company has contracts with or will negotiate with certain facilities, including the following:        Yes   Patient/family informed of bed offers received.  Patient chooses bed at [facility]     Physician recommends and patient chooses bed at      Patient to be transferred to [facility] on 02/20/16.  Patient to be transferred to facility by Ambulance     Patient family notified on 02/20/16 of transfer.  Name of family member notified:  Jimmy     PHYSICIAN Please prepare priority discharge summary, including  medications, Please sign DNR, Please prepare prescriptions, Please sign FL2     Additional Comment:  Per MD patient ready for DC to [facility] with palliative services. RN, patient, patient's family, and facility notified of DC. RN given number for report. DC packet on chart. Hospice revocation paperwork completed with [place] social worker. MD will complete most form with patient's son prior to DC. Ambulance transport requested for patient. CSW signing off.   _______________________________________________ Rigoberto Noel, LCSW 02/20/2016, 4:55 PM

## 2016-02-20 NOTE — Progress Notes (Signed)
[name] [id number] [facility] Liaison note  [facility] facesheet and med list on shadow chart.  This is an [facility] related and covered admission of 02/17/16, per Dr. Catalina Lunger Mazzocchi. Patient went from her independent living facility to [facility] on 11/14, after caregivers noted changes in her mental status and right sided weakness. Patient was then transferred to [facility] where work-up revealed an acute ischemic stroke.   Spoke with patient's son, Laverna Peace, who states plan is for patient to discharge to [name] for efforts at rehabiliation.  Joint visit completed with Jasmine Awe, [facility] SW.  Patient will revoke Hospice benefit, in order to use Medicare to pay for SNF.    [facility] will continue to follow daily, until discharge.  Efrain Sella, RN, BSN [phone]

## 2016-02-20 NOTE — Progress Notes (Signed)
SLP Cancellation Note  Patient Details Name: Nancy [name] MRN: [id number] DOB: 09-19-1925   Cancelled treatment:        Attempted to meet with son to educate swallow precautions, however he is currently out of the building touring [facility] per RN.    [place] Siren 02/20/2016, 10:23 AM

## 2016-02-20 NOTE — Social Work (Signed)
[facility]) SW Note -  SW met with pt, her ex-daughter-in-law, Mickel Baas, son, Laverna Peace, and [facility] RN, Efrain Sella to discuss discharge plan.  SW also left a vm for hospital SW, [place].  Consulted MD.  Pandora Leiter wants pt to receive rehab at [facility].  Provided education to family regarding discharging [facility] in order to utilize skilled Medicare benefit.  He stated he understood.  Pt was alert at times.  She was more verbal during this visit.  When fed some ice cream, she had a coughing episode.  She denied pain.  Please contact [facility] if pt is to discharge to [facility].  Creola Corn. [place], [place]

## 2016-02-20 NOTE — Progress Notes (Signed)
Physical Therapy Treatment Patient Details Name: Nancy [name] MRN: [id number] DOB: Dec 04, 1925 Today's Date: 02/20/2016    History of Present Illness HPI 82 year old female with past medical history of mild to moderate dementia who presents with altered mental status. Per report from nursing facility, the patient is alert and oriented at baseline to person and place but not time. She is normally ambulatory with a walker. She was normal night of 11/13 upon going to bed. Upon awakening this morning, patient was noted to have right sided arm and leg weakness. MR brain shows.   Patchy acute infarct in the left PCA territory, involving the occipital lobe and thalamus. Cannot exclude small foci acute infarction in the left MCA and ACA territories.    PT Comments    Patient alert and spontaneously turned her head to the right when her name called. Moving all 4 extremities on command, however at times with inattention to RUE. Sat EOB ~10 minutes with work on finding midline/upright and LE exercises. Significantly improved from last session.   Follow Up Recommendations  SNF     Equipment Recommendations  None recommended by PT    Recommendations for Other Services       Precautions / Restrictions Precautions Precautions: Fall    Mobility  Bed Mobility Overal bed mobility: Needs Assistance Bed Mobility: Rolling;Sidelying to Sit;Sit to Sidelying Rolling: Max assist Sidelying to sit: Max assist;+2 for physical assistance;HOB elevated     Sit to sidelying: Mod assist General bed mobility comments: pt following commands with delay; using rail to assist;   Transfers Overall transfer level: Needs assistance Equipment used: 2 person hand held assist Transfers: Sit to/from Stand Sit to Stand: Mod assist;+2 physical assistance;+2 safety/equipment         General transfer comment: pt intiates properly; boost due to decr strength; support of bil UEs and at shoulders to achieve upright  posture  Ambulation/Gait Ambulation/Gait assistance: Max assist;+2 physical assistance;+2 safety/equipment Ambulation Distance (Feet):  (2 steps sideways each foot) Assistive device: 2 person hand held assist Gait Pattern/deviations: Step-to pattern     General Gait Details: required assist for weight-shift and advancing each foot to step toward [facility]   Stairs            Wheelchair Mobility    Modified Rankin (Stroke Patients Only) Modified Rankin (Stroke Patients Only) Pre-Morbid Rankin Score: Moderate disability Modified Rankin: Severe disability     Balance Overall balance assessment: Needs assistance Sitting-balance support: Bilateral upper extremity supported Sitting balance-Leahy Scale: Poor Sitting balance - Comments: leaning forward; able to correct with cues; holds upright for sevveral seconds and then begins to lean again Postural control: Left lateral lean Standing balance support: Bilateral upper extremity supported Standing balance-Leahy Scale: Zero                      Cognition Arousal/Alertness: Awake/alert Behavior During Therapy: WFL for tasks assessed/performed Overall Cognitive Status: Impaired/Different from baseline Area of Impairment: Orientation;Attention;Following commands;Awareness;Problem solving Orientation Level: Place;Time;Situation Current Attention Level: Sustained   Following Commands: Follows one step commands inconsistently     Problem Solving: Decreased initiation;Difficulty sequencing;Requires verbal cues;Requires tactile cues General Comments: at times following commands with all 4 extremities; at times not with Rt (inattn)    Exercises General Exercises - Upper Extremity Shoulder Flexion: AAROM;Right;AROM;Left Digit Composite Flexion: AROM;Both General Exercises - Lower Extremity Long Arc Quad: AROM;Both;5 reps;Seated Heel Slides: AROM;Strengthening;Both;5 reps    General Comments General comments (skin  integrity, edema, etc.): Long-time caregiver,  Nancy [name], present.      Pertinent Vitals/Pain Pain Assessment: Faces Faces Pain Scale: No hurt    Home Living                      Prior Function            PT Goals (current goals can now be found in the care plan section) Acute Rehab PT Goals Patient Stated Goal: agrees to getting moving more Time For Goal Achievement: 03/03/16 Progress towards PT goals: Progressing toward goals    Frequency    Min 2X/week      PT Plan Current plan remains appropriate    Co-evaluation             End of Session Equipment Utilized During Treatment: Gait belt Activity Tolerance: Patient limited by fatigue Patient left: in bed;with call bell/phone within reach;with bed alarm set;with family/visitor present     Time: 1610-9604 PT Time Calculation (min) (ACUTE ONLY): 28 min  Charges:  $Therapeutic Activity: 23-37 mins                    G CodesScherrie November Aman Batley 2016/03/14, 3:07 PM Pager [phone]

## 2016-02-20 NOTE — NC FL2 (Signed)
[place] MEDICAID FL2 LEVEL OF CARE SCREENING TOOL     IDENTIFICATION  Patient Name: Nancy [name] Birthdate: [birth year] Sex: female Admission Date (Current Location): 02/17/2016  [facility] and [place] Number:  Herbalist and Address:  [facility] [place], [place] [address], [place], [place] [address]      Provider Number: [id number]  Attending Physician Name and Address:  Lavina Hamman, MD  Relative Name and Phone Number:       Current Level of Care: Hospital Recommended Level of Care: [place] Prior Approval Number:    Date Approved/Denied:   PASRR Number:    Discharge Plan: SNF    Current Diagnoses: Patient Active Problem List   Diagnosis Date Noted  . CVA (cerebral vascular accident) ([place]) 02/17/2016  . Stroke (cerebrum) ([place]) 02/17/2016  . Rhabdomyolysis 09/30/2015  . Drug-induced injury of liver   . Liver injury   . AKI (acute kidney injury) ([place])   . Chronic fatigue   . Elevated LFTs 09/25/2015  . Cholelithiasis 09/25/2015  . PAF (paroxysmal atrial fibrillation) ([place]) 09/25/2015  . Chronic kidney disease (CKD), stage IV (severe) ([place]) 09/25/2015  . Systolic and diastolic CHF, acute on chronic ([place]) 09/25/2015  . SOB (shortness of breath) 09/24/2015  . Fatigue 09/24/2015  . Melena 08/30/2015  . Moderate to severe pulmonary hypertension 08/30/2015  . Acute epigastric pain 08/30/2015  . Elevated transaminase level 08/30/2015  . Acute diastolic heart failure, NYHA class 1 ([place]) 08/30/2015  . Chronic anticoagulation 08/11/2015  . Elevated troponin 07/20/2015  . Recent NSTEMI  07/20/2015  . Chest pain with high risk of acute coronary syndrome 07/20/2015  . Contact dermatitis 12/18/2014  . Encounter for therapeutic drug monitoring 09/05/2014  . Acute combined systolic (EF [id number]) and grade 3 diastolic heart failure ([place]) 03/11/2014  . Mitral regurgitation 02/11/2014  . FATIGUE / MALAISE 11/05/2009  . Atrial  fibrillation ([place]) 08/26/2009  . SCARLET FEVER 07/29/2009  . COPD GOLD II  07/29/2009  . GERD 07/29/2009  . DIVERTICULOSIS, COLON 07/29/2009  . COLONIC POLYPS, ADENOMATOUS, HX OF 07/29/2009  . HLD (hyperlipidemia) 07/02/2009  . EXPRESSIVE LANGUAGE DISORDER 07/02/2009  . Memory loss 07/02/2009  . APHASIA 07/02/2009    Orientation RESPIRATION BLADDER Height & Weight     Self  O2 (2L) Incontinent Weight: 50.4 kg (111 lb 1.8 oz) Height:  5\' 6"  (167.6 cm)  BEHAVIORAL SYMPTOMS/MOOD NEUROLOGICAL BOWEL NUTRITION STATUS   (NONE)  (NONE) Incontinent Diet (DYS 1)  AMBULATORY STATUS COMMUNICATION OF NEEDS Skin   Extensive Assist Verbally Normal                       Personal Care Assistance Level of Assistance  Bathing, Feeding, Dressing Bathing Assistance: Limited assistance Feeding assistance: Independent Dressing Assistance: Limited assistance     Functional Limitations Info  Sight, Hearing, Speech Sight Info: Adequate Hearing Info: Adequate Speech Info: Adequate    SPECIAL CARE FACTORS FREQUENCY  PT (By licensed PT), OT (By licensed OT)     PT Frequency: 5/week OT Frequency: 5/week            Contractures Contractures Info: Not present    Additional Factors Info  Code Status, Allergies Code Status Info: DNR Allergies Info: NKDA           Current Medications (02/20/2016):  This is the current hospital active medication list Current Facility-Administered Medications  Medication Dose Route Frequency Provider Last Rate Last Dose  . 0.9 %  sodium chloride infusion   Intravenous Continuous Domenic Polite, MD 50 mL/hr at 02/20/16 0251    . aspirin chewable tablet 81 mg  81 mg Oral Daily Kerney Elbe, MD   81 mg at 02/20/16 0958  . chlorhexidine (PERIDEX) 0.12 % solution 15 mL  15 mL Mouth Rinse BID Domenic Polite, MD   15 mL at 02/20/16 0950  . digoxin (LANOXIN) tablet 0.125 mg  0.125 mg Oral Daily Nita Sells, MD   0.125 mg at 02/20/16 0950  .  enoxaparin (LOVENOX) injection 30 mg  30 mg Subcutaneous Q24H Nita Sells, MD   30 mg at 02/19/16 2304  . MEDLINE mouth rinse  15 mL Mouth Rinse q12n4p Domenic Polite, MD   15 mL at 02/20/16 1242  . metoprolol tartrate (LOPRESSOR) tablet 37.5 mg  37.5 mg Oral BID Nita Sells, MD   37.5 mg at 02/20/16 0950  . nitroGLYCERIN (NITROSTAT) SL tablet 0.4 mg  0.4 mg Sublingual Q5 Min x 3 PRN Nita Sells, MD      . senna-docusate (Senokot-S) tablet 1 tablet  1 tablet Oral QHS PRN Nita Sells, MD         Discharge Medications: Please see discharge summary for a list of discharge medications.  Relevant Imaging Results:  Relevant Lab Results:   Additional Information SSN: [id number], patient will need palliative services at SNF.  Rigoberto Noel, LCSW

## 2016-02-20 NOTE — Discharge Summary (Signed)
[facility] Discharge Summary   Patient: Nancy [name] [id number]   PCP: [name], MD DOB: 03/11/1926   Date of admission: 02/17/2016   Date of discharge:  02/20/2016    Discharge Diagnoses:  Active Problems:   CVA (cerebral vascular accident) ([place])   Stroke (cerebrum) ([place])   Admitted From: snf Disposition:  snf  Recommendations for Outpatient Follow-up:  1.  Please follow-up with PCP in one week. 2. Please continue to engage with palliative care and transition care to inpatient hospice if the patient does not show any progression  Follow-up Information    [name], MD. Schedule an appointment as soon as possible for a visit in 1 week(s).   Specialty:  Internal Medicine Contact information: [address] [place] [address] [phone]          Diet recommendation: Nothing by mouth per speech evaluation, after discussion with the family dysphagia 1 and thin liquids for comfort feeds  Activity: The patient is advised to gradually reintroduce usual activities.  Discharge Condition: stable  Code Status: DNR/DNI  History of present illness: As per the H and P dictated on admission, "Mod-dementia-basleine alert oriented x 2 normally, using walker Over this past weekend was able to go to have lunch out and was able to ambulate without issues Had been on hospice secondary to lethargy and change in mental state after receiving pain medication for a recent back fracture in February of this ear and hospice had come out to help under direction of primary care physician Note that after statin induced myopathy multiple medications were discontinued including anticoagulation   Has been at facility since the past year in the independent section Has on and off episodes of confusion  Was seen last normal 11 13 2017 and was found to be confused more so than normal this morning unable to move the right side upper and lower areas with some  hemi-neglect"  Hospital Course:  Summary of her active problems in the hospital is as following. Stroke: Patchy left PCAinfarct embolic secondary to known atrial fibrillation  -Obtunded but will answer some questions periodically -MRANot ordered - Neurology consulted, d/w Dr.Sethi felt poor prognosis recommends Palliative care/Hospice  -CarotidDopplerand ECHO cancelled -continued on ASA -She currently followed by hospice at home in the last 3 weeks prior to admission -now she failed swallow eval, NPO recommended per Speech pathologist, I talked with son who is PoA( resides in [place]) and recommended comfort care and comfort feeds with Dysphagia 1 diet understanding high aspiration risks for comfort based care, son understands and is agreeable. -discussed residential hospice with son  Atrial Fibrillation w/ RVR -continue digoxin and metoprolol, HR improved -Chads score is 6, or anticoagulation stopped per family in the past  Dementia -known, moderate per family friend, on home hospice Now that the family is requesting patient be transferred to the SNF, hospice will be on hold. Recommend family to continue engaging with palliative care and transition care to inpatient hospice when appropriate.  Hypertension -continue metoprolol  Hyperlipidemia -no plans to start statin now  Coronary artery disease - NSTEMI 07/2015, stable continue ASA/metoprolol  Possible aspiration into airway Patient with significant dysphagia, family understanding the risk. Family is requiring and wishing to transfer the patient to SNF with hospice and palliative support to see if the patient will improve with therapy. My Recommendation would be to transfer the patient's care to inpatient hospice, the patient does not show any meaningful recovery and improvement in next 24 hours.  All  other chronic medical condition were stable during the hospitalization.  Patient was seen by physical therapy, who  recommended SNF, which was arranged by Education officer, museum and case Freight forwarder. On the day of the discharge the patient's vitals were stable, and no other acute medical condition were reported by patient. the patient was felt safe to be discharge at SNF with therapy with hospice support.  Procedures and Results:  none   Consultations:  Neurologic  DISCHARGE MEDICATION: Current Discharge Medication List    START taking these medications   Details  senna-docusate (SENOKOT-S) 8.6-50 MG tablet Take 1 tablet by mouth at bedtime as needed for mild constipation.      CONTINUE these medications which have CHANGED   Details  digoxin 62.5 MCG TABS Take 0.0625 mg by mouth daily.      CONTINUE these medications which have NOT CHANGED   Details  aspirin EC 81 MG EC tablet Take 1 tablet (81 mg total) by mouth daily.    metoprolol tartrate 37.5 MG TABS Take 37.5 mg by mouth 2 (two) times daily. Qty: 60 tablet, Refills: 1    nitroGLYCERIN (NITROSTAT) 0.4 MG SL tablet Place 1 tablet (0.4 mg total) under the tongue every 5 (five) minutes x 3 doses as needed for chest pain. Qty: 25 tablet, Refills: 3       No Known Allergies  Discharge Exam: Filed Weights   02/17/16 1700  Weight: 50.4 kg (111 lb 1.8 oz)   Vitals:   02/20/16 0900 02/20/16 1400  BP: (!) 147/61 127/86  Pulse: 83 75  Resp: 18 18  Temp: 98 F (36.7 C) 98.4 F (36.9 C)   General: Appear in moderate distress, no Rash; Oral Mucosa moist. Cardiovascular: S1 and S2 Present, no Murmur, no JVD Respiratory: Bilateral Air entry present and bilateral Crackles, no wheezes Abdomen: Bowel Sound present, Soft and no tenderness Extremities: no Pedal edema, no calf tenderness Neurology: Not oriented, not following command, right-sided hemiparesis, right-sided neglect  The results of significant diagnostics from this hospitalization (including imaging, microbiology, ancillary and laboratory) are listed below for reference.    Significant  Diagnostic Studies: Dg Chest 2 View  Result Date: 02/17/2016 CLINICAL DATA:  Right-sided weakness. EXAM: CHEST  2 VIEW COMPARISON:  Chest x-ray 11/23/2015, lumbar spine 11/24/2015 FINDINGS: Mild hyperinflation. Negative for pneumonia. Negative for heart failure or effusion. Severe compression fracture L1 has progressed since the prior study of 11/24/2015. IMPRESSION: Pulmonary hyperinflation.  No acute cardiopulmonary abnormality Severe compression fracture L1 with significant progression since 11/24/2015 Electronically Signed   By: Franchot Gallo M.D.   On: 02/17/2016 12:45   Ct Head Wo Contrast  Result Date: 02/17/2016 CLINICAL DATA:  Altered mental status. EXAM: CT HEAD WITHOUT CONTRAST TECHNIQUE: Contiguous axial images were obtained from the base of the skull through the vertex without intravenous contrast. COMPARISON:  CT scan of November 23, 2015. FINDINGS: Brain: Mild diffuse cortical atrophy is noted. Mild chronic ischemic white matter disease is noted. No mass effect or midline shift is noted. Ventricular size is within normal limits. There is no evidence of mass lesion, hemorrhage or acute infarction. Vascular: Atherosclerosis of carotid siphons is noted. Skull: Bony calvarium appears intact. Sinuses/Orbits: Visualized paranasal sinuses appear normal. Other: None. IMPRESSION: Mild diffuse cortical atrophy. Mild chronic ischemic white matter disease. No acute intracranial abnormality seen. Electronically Signed   By: Marijo Conception, M.D.   On: 02/17/2016 12:02   Mr Brain Wo Contrast  Result Date: 02/17/2016 CLINICAL DATA:  Right-sided weakness  EXAM: MRI HEAD WITHOUT CONTRAST TECHNIQUE: Multiplanar, multiecho pulse sequences of the brain and surrounding structures were obtained without intravenous contrast. COMPARISON:  Head CT from earlier today.  Brain MRI 08/18/2015 FINDINGS: Brain: Restricted diffusion in the left PCA territory involving the inferior and parasagittal occipital lobe,  periventricular white matter, and posterior left thalamus. 1 cm or smaller DWI hyperintense foci in the parasagittal left frontal lobe and posterior left frontal white matter, 3:34 and 3:36, are not convincing when correlated with ADC map and could be related to motion artifact. No evidence of superimposed hemorrhage. There is generalized atrophy with moderate mesial temporal volume loss, similar to prior. Chronic microvascular disease with ischemic gliosis confluent the deep cerebral white matter. Prominent dilated perivascular spaces in the deep gray nuclei. Vascular: Grossly preserved flow voids. Skull and upper cervical spine: No focal lesion identified. Sinuses/Orbits: Mild mucosal thickening in the left sphenoid sinus with retained secretions. IMPRESSION: 1. Patchy acute infarct in the left PCA territory, involving the occipital lobe and thalamus. 2. Cannot exclude small foci acute infarction in the left MCA and ACA territories, certainty limited by extensive motion artifact. 3. Atrophy including mesial temporal lobes. Moderate chronic microvascular disease. Electronically Signed   By: Monte Fantasia M.D.   On: 02/17/2016 13:19   Dg Swallowing Func-speech Pathology  Result Date: 02/18/2016 Objective Swallowing Evaluation: Type of Study: MBS-Modified Barium Swallow Study Patient Details Name: Leha Hatz MRN: [id number] Date of Birth: 18-Dec-1925 Today's Date: 02/18/2016 Time: SLP Start Time (ACUTE ONLY): 1412-SLP Stop Time (ACUTE ONLY): 1428 SLP Time Calculation (min) (ACUTE ONLY): 16 min Past Medical History: Past Medical History: Diagnosis Date . CKD (chronic kidney disease), stage III  . Depression  . Dyslipidemia  . GERD (gastroesophageal reflux disease)  . History of scarlet fever  . Melanoma ([place])  . Migraine  . Mitral regurgitation   a. Echo (11/15):  EF 50-55%, Gr 2 DD, MAC, prolapsing P2 segment of post MV leaflet with mod to possibly severe MR, mild LAE, normal RVF, PASP 35 mmHg;  b. Echo 5/16:  Mild LVH, EF 55-60%, trivial AI, moderate MR (LVID, ES 32.3 mm), mild BAE, moderate TR, PASP 48 c. Echo 07/2015:EF 45-50%, moderate to severe MR  . NSTEMI (non-ST elevated myocardial infarction) ([place]) 07/2015  a. Troponin peak of 7.17. Cath w/ normal cors, thought to be 2ry to vasospasm . PAF (paroxysmal atrial fibrillation) ([place])   a. On Coumadin Past Surgical History: Past Surgical History: Procedure Laterality Date . APPENDECTOMY   . CARDIAC CATHETERIZATION N/A 07/21/2015  Procedure: Left Heart Cath and Coronary Angiography;  Surgeon: Lorretta Harp, MD;  Location: [facility];  Service: Cardiovascular;  Laterality: N/A; . CATARACT EXTRACTION    bilateral . CESAREAN SECTION   . SKIN BIOPSY   . TONSILLECTOMY   HPI: 82 year old female with past medical history of mild to moderate dementia from SNF who presents with altered mental status, right sided arm and leg weakness. MRI patchy acute infarct in the left PCA territory, involving the occipital lobe and thalamus, cannot exclude small foci acute infarction in the left MCA and ACA territories, certainty limited by extensive motion artifact. No Data Recorded Assessment / Plan / Recommendation CHL IP CLINICAL IMPRESSIONS 02/18/2016 Therapy Diagnosis Severe oral phase dysphagia;Moderate oral phase dysphagia;Severe pharyngeal phase dysphagia Clinical Impression Pt exhibited mod-severe oral and severe pharyngeal dysphagia resulting in delayed oral transit up to 1-3 minutes exacerbated by aphasia and cognitive impairments (poor attentin). Premature spill with most consistencies. Silent and frank  aspiration with nectar thick during the swallow. Max vallecuar and mild-mod pyriform sinsuses with all consistencies. She is unable to perform compensatory strategies and is at high risk for aspiration with all consistencies at present. Spoke with pt's former daughter-in-law and pt's MD and thought is pt may have comfort care with comfort feeds with known aspiration risks.  Discussed with MD and decided to hold on ordering comfort care diet until pt's son and POA arrives next date.    Impact on safety and function Severe aspiration risk   CHL IP TREATMENT RECOMMENDATION 02/18/2016 Treatment Recommendations Therapy as outlined in treatment plan below   Prognosis 02/18/2016 Prognosis for Safe Diet Advancement Fair Barriers to Reach Goals Cognitive deficits Barriers/Prognosis Comment -- CHL IP DIET RECOMMENDATION 02/18/2016 SLP Diet Recommendations NPO Liquid Administration via -- Medication Administration -- Compensations -- Postural Changes --   CHL IP OTHER RECOMMENDATIONS 02/18/2016 Recommended Consults -- Oral Care Recommendations Oral care BID Other Recommendations --   CHL IP FOLLOW UP RECOMMENDATIONS 02/18/2016 Follow up Recommendations Skilled Nursing facility   [facility] IP FREQUENCY AND DURATION 02/18/2016 Speech Therapy Frequency (ACUTE ONLY) min 1 x/week Treatment Duration 1 week      CHL IP ORAL PHASE 02/18/2016 Oral Phase Impaired Oral - Pudding Teaspoon -- Oral - Pudding Cup -- Oral - Honey Teaspoon -- Oral - Honey Cup Delayed oral transit;Holding of bolus;Premature spillage Oral - Nectar Teaspoon -- Oral - Nectar Cup Delayed oral transit;Holding of bolus;Premature spillage Oral - Nectar Straw -- Oral - Thin Teaspoon -- Oral - Thin Cup -- Oral - Thin Straw -- Oral - Puree Holding of bolus;Delayed oral transit Oral - Mech Soft -- Oral - Regular -- Oral - Multi-Consistency -- Oral - Pill -- Oral Phase - Comment --  CHL IP PHARYNGEAL PHASE 02/18/2016 Pharyngeal Phase Impaired Pharyngeal- Pudding Teaspoon -- Pharyngeal -- Pharyngeal- Pudding Cup -- Pharyngeal -- Pharyngeal- Honey Teaspoon -- Pharyngeal -- Pharyngeal- Honey Cup Pharyngeal residue - valleculae;Pharyngeal residue - pyriform;Reduced tongue base retraction;Reduced laryngeal elevation Pharyngeal -- Pharyngeal- Nectar Teaspoon -- Pharyngeal -- Pharyngeal- Nectar Cup Penetration/Aspiration during swallow;Pharyngeal residue  - valleculae;Pharyngeal residue - pyriform;Reduced laryngeal elevation;Reduced tongue base retraction Pharyngeal Material enters airway, passes BELOW cords without attempt by patient to eject out (silent aspiration) Pharyngeal- Nectar Straw -- Pharyngeal -- Pharyngeal- Thin Teaspoon -- Pharyngeal -- Pharyngeal- Thin Cup -- Pharyngeal -- Pharyngeal- Thin Straw -- Pharyngeal -- Pharyngeal- Puree Pharyngeal residue - valleculae;Pharyngeal residue - pyriform;Reduced tongue base retraction;Reduced laryngeal elevation Pharyngeal -- Pharyngeal- Mechanical Soft -- Pharyngeal -- Pharyngeal- Regular -- Pharyngeal -- Pharyngeal- Multi-consistency -- Pharyngeal -- Pharyngeal- Pill -- Pharyngeal -- Pharyngeal Comment --  CHL IP CERVICAL ESOPHAGEAL PHASE 02/18/2016 Cervical Esophageal Phase WFL Pudding Teaspoon -- Pudding Cup -- Honey Teaspoon -- Honey Cup -- Nectar Teaspoon -- Nectar Cup -- Nectar Straw -- Thin Teaspoon -- Thin Cup -- Thin Straw -- Puree -- Mechanical Soft -- Regular -- Multi-consistency -- Pill -- Cervical Esophageal Comment -- No flowsheet data found. Houston Siren 02/18/2016, 4:00 PM  Orbie Pyo Colvin Caroli.Ed CCC-SLP Pager [phone]              Microbiology: No results found for this or any previous visit (from the past 240 hour(s)).   Labs: CBC:  Recent Labs Lab 02/17/16 1118 02/17/16 1750  WBC 7.8 10.7*  HGB 14.5 14.5  HCT 42.0 42.4  MCV 93.3 93.6  PLT [id number]   Basic Metabolic Panel:  Recent Labs Lab 02/17/16 1118 02/17/16 1750  NA 139  --  K 4.8  --   CL 101  --   CO2 29  --   GLUCOSE 107*  --   BUN 13  --   CREATININE 1.04* 0.95  CALCIUM 9.5  --    Liver Function Tests:  Recent Labs Lab 02/17/16 1118  AST 45*  ALT 21  ALKPHOS 114  BILITOT 2.5*  PROT 7.4  ALBUMIN 3.7   BNP (last 3 results)  Recent Labs  08/30/15 1020 09/24/15 1600 09/28/15 1155  BNP 1,433.0* 1,347.3* 787.3*   CBG:  Recent Labs Lab 02/17/16 1128 02/19/16 0130  GLUCAP 82  93   Time spent: 30 minutes  Signed:  PATEL, PRANAV  [facility]  02/20/2016  , 3:28 PM

## 2016-02-22 ENCOUNTER — Encounter (HOSPITAL_COMMUNITY): Payer: Self-pay | Admitting: Emergency Medicine

## 2016-02-22 ENCOUNTER — Emergency Department (HOSPITAL_COMMUNITY)
Admission: EM | Admit: 2016-02-22 | Discharge: 2016-02-23 | Disposition: A | Discharge status: Home / Self Care | Payer: Medicare Other | Attending: Emergency Medicine | Admitting: Emergency Medicine

## 2016-02-22 ENCOUNTER — Emergency Department (HOSPITAL_COMMUNITY): Payer: Medicare Other

## 2016-02-22 DIAGNOSIS — Y939 Activity, unspecified: Secondary | ICD-10-CM | POA: Insufficient documentation

## 2016-02-22 DIAGNOSIS — I252 Old myocardial infarction: Secondary | ICD-10-CM | POA: Insufficient documentation

## 2016-02-22 DIAGNOSIS — Z8673 Personal history of transient ischemic attack (TIA), and cerebral infarction without residual deficits: Secondary | ICD-10-CM | POA: Insufficient documentation

## 2016-02-22 DIAGNOSIS — N184 Chronic kidney disease, stage 4 (severe): Secondary | ICD-10-CM | POA: Insufficient documentation

## 2016-02-22 DIAGNOSIS — Z7901 Long term (current) use of anticoagulants: Secondary | ICD-10-CM | POA: Insufficient documentation

## 2016-02-22 DIAGNOSIS — I130 Hypertensive heart and chronic kidney disease with heart failure and stage 1 through stage 4 chronic kidney disease, or unspecified chronic kidney disease: Secondary | ICD-10-CM | POA: Insufficient documentation

## 2016-02-22 DIAGNOSIS — J449 Chronic obstructive pulmonary disease, unspecified: Secondary | ICD-10-CM | POA: Diagnosis not present

## 2016-02-22 DIAGNOSIS — S199XXA Unspecified injury of neck, initial encounter: Secondary | ICD-10-CM | POA: Diagnosis not present

## 2016-02-22 DIAGNOSIS — W19XXXA Unspecified fall, initial encounter: Secondary | ICD-10-CM

## 2016-02-22 DIAGNOSIS — Y92410 Unspecified street and highway as the place of occurrence of the external cause: Secondary | ICD-10-CM | POA: Insufficient documentation

## 2016-02-22 DIAGNOSIS — W1839XA Other fall on same level, initial encounter: Secondary | ICD-10-CM | POA: Insufficient documentation

## 2016-02-22 DIAGNOSIS — R9431 Abnormal electrocardiogram [ECG] [EKG]: Secondary | ICD-10-CM | POA: Diagnosis not present

## 2016-02-22 DIAGNOSIS — I5043 Acute on chronic combined systolic (congestive) and diastolic (congestive) heart failure: Secondary | ICD-10-CM | POA: Insufficient documentation

## 2016-02-22 DIAGNOSIS — Z87891 Personal history of nicotine dependence: Secondary | ICD-10-CM | POA: Insufficient documentation

## 2016-02-22 DIAGNOSIS — S0990XA Unspecified injury of head, initial encounter: Secondary | ICD-10-CM | POA: Diagnosis not present

## 2016-02-22 DIAGNOSIS — Y999 Unspecified external cause status: Secondary | ICD-10-CM | POA: Insufficient documentation

## 2016-02-22 DIAGNOSIS — S0003XA Contusion of scalp, initial encounter: Secondary | ICD-10-CM | POA: Diagnosis not present

## 2016-02-22 DIAGNOSIS — Z7982 Long term (current) use of aspirin: Secondary | ICD-10-CM | POA: Insufficient documentation

## 2016-02-22 LAB — URINALYSIS, ROUTINE W REFLEX MICROSCOPIC
Bilirubin Urine: NEGATIVE
GLUCOSE, UA: NEGATIVE mg/dL
HGB URINE DIPSTICK: NEGATIVE
Ketones, ur: 15 mg/dL — AB
Leukocytes, UA: NEGATIVE
Nitrite: NEGATIVE
Protein, ur: NEGATIVE mg/dL
SPECIFIC GRAVITY, URINE: 1.01 (ref 1.005–1.030)
pH: 7 (ref 5.0–8.0)

## 2016-02-22 NOTE — ED Notes (Signed)
Patient has dementia. CVA last Tuesday.

## 2016-02-22 NOTE — ED Notes (Signed)
Patient transported to CT 

## 2016-02-22 NOTE — ED Provider Notes (Signed)
[facility] DEPT Provider Note   CSN: [id number] Arrival date & time: 02/22/16  1854     History   Chief Complaint Chief Complaint  Patient presents with  . Fall    HPI Dreonna Vink is a 82 y.o. female.  Patient is a 82 year old female. She has a history of dementia and was recently admitted November 14 for a stroke with right-sided weakness and difficulty ambulating. She was discharged to [facility] for [facility]. She was started back on Eliquis today. She has a known history of atrial fibrillation. She had an unwitnessed fall today. She was found in the bathroom with a walker and had fallen on the floor. She has an injury to her head with bleeding and hematoma into her right parietal area. She hasn't been complaining of other pain.  A family member who is with the patient states that she is at her baseline mental status. She has had some improvement in her right-sided weakness and seems to be at baseline currently. No recent fevers or other illnesses. Per family, her tetanus shot is up-to-date.      Past Medical History:  Diagnosis Date  . CKD (chronic kidney disease), stage III   . Depression   . Dyslipidemia   . GERD (gastroesophageal reflux disease)   . History of scarlet fever   . Melanoma ([place])   . Migraine   . Mitral regurgitation    a. Echo (11/15):  EF 50-55%, Gr 2 DD, MAC, prolapsing P2 segment of post MV leaflet with mod to possibly severe MR, mild LAE, normal RVF, PASP 35 mmHg;  b. Echo 5/16: Mild LVH, EF 55-60%, trivial AI, moderate MR (LVID, ES 32.3 mm), mild BAE, moderate TR, PASP 48 c. Echo 07/2015:EF 45-50%, moderate to severe MR   . NSTEMI (non-ST elevated myocardial infarction) ([place]) 07/2015   a. Troponin peak of 7.17. Cath w/ normal cors, thought to be 2ry to vasospasm  . PAF (paroxysmal atrial fibrillation) ([place])    a. On Coumadin    Patient Active Problem List   Diagnosis Date Noted  . CVA (cerebral vascular accident) ([place])  02/17/2016  . Stroke (cerebrum) ([place]) 02/17/2016  . Rhabdomyolysis 09/30/2015  . Drug-induced injury of liver   . Liver injury   . AKI (acute kidney injury) ([place])   . Chronic fatigue   . Elevated LFTs 09/25/2015  . Cholelithiasis 09/25/2015  . PAF (paroxysmal atrial fibrillation) ([place]) 09/25/2015  . Chronic kidney disease (CKD), stage IV (severe) ([place]) 09/25/2015  . Systolic and diastolic CHF, acute on chronic ([place]) 09/25/2015  . SOB (shortness of breath) 09/24/2015  . Fatigue 09/24/2015  . Melena 08/30/2015  . Moderate to severe pulmonary hypertension 08/30/2015  . Acute epigastric pain 08/30/2015  . Elevated transaminase level 08/30/2015  . Acute diastolic heart failure, NYHA class 1 ([place]) 08/30/2015  . Chronic anticoagulation 08/11/2015  . Elevated troponin 07/20/2015  . Recent NSTEMI  07/20/2015  . Chest pain with high risk of acute coronary syndrome 07/20/2015  . Contact dermatitis 12/18/2014  . Encounter for therapeutic drug monitoring 09/05/2014  . Acute combined systolic (EF [id number]) and grade 3 diastolic heart failure ([place]) 03/11/2014  . Mitral regurgitation 02/11/2014  . FATIGUE / MALAISE 11/05/2009  . Atrial fibrillation ([place]) 08/26/2009  . SCARLET FEVER 07/29/2009  . COPD GOLD II  07/29/2009  . GERD 07/29/2009  . DIVERTICULOSIS, COLON 07/29/2009  . COLONIC POLYPS, ADENOMATOUS, HX OF 07/29/2009  . HLD (hyperlipidemia) 07/02/2009  . EXPRESSIVE LANGUAGE DISORDER 07/02/2009  .  Memory loss 07/02/2009  . APHASIA 07/02/2009    Past Surgical History:  Procedure Laterality Date  . APPENDECTOMY    . CARDIAC CATHETERIZATION N/A 07/21/2015   Procedure: Left Heart Cath and Coronary Angiography;  Surgeon: Lorretta Harp, MD;  Location: [facility];  Service: Cardiovascular;  Laterality: N/A;  . CATARACT EXTRACTION     bilateral  . CESAREAN SECTION    . SKIN BIOPSY    . TONSILLECTOMY      OB History    No data available       Home Medications    Prior to  Admission medications   Medication Sig Start Date End Date Taking? Authorizing Provider  apixaban (ELIQUIS) 2.5 MG TABS tablet Take 2.5 mg by mouth 2 (two) times daily.   Yes Historical Provider, MD  digoxin 62.5 MCG TABS Take 0.0625 mg by mouth daily. 02/19/16  Yes Domenic Polite, MD  LORazepam (ATIVAN) 0.5 MG tablet Take 0.5 mg by mouth every 8 (eight) hours as needed for anxiety.   Yes Historical Provider, MD  metoprolol tartrate 37.5 MG TABS Take 37.5 mg by mouth 2 (two) times daily. 08/31/15  Yes Reyne Dumas, MD  nitroGLYCERIN (NITROSTAT) 0.4 MG SL tablet Place 1 tablet (0.4 mg total) under the tongue every 5 (five) minutes x 3 doses as needed for chest pain. 07/22/15  Yes Erma Heritage, PA  senna-docusate (SENOKOT-S) 8.6-50 MG tablet Take 1 tablet by mouth at bedtime as needed for mild constipation. 02/19/16  Yes Domenic Polite, MD  aspirin EC 81 MG EC tablet Take 1 tablet (81 mg total) by mouth daily. Patient not taking: Reported on 02/22/2016 09/30/15   Venetia Maxon Rama, MD    Family History Family History  Problem Relation Age of Onset  . Colon polyps    . Heart attack Neg Hx   . Stroke Neg Hx   . Hypertension Neg Hx     Social History Social History  Substance Use Topics  . Smoking status: Former Smoker    Years: 6.00    Types: Cigarettes    Quit date: 09/09/1967  . Smokeless tobacco: Never Used     Comment: pt reports she smoked socially  . Alcohol use 4.2 oz/week    7 Glasses of wine per week     Allergies   Patient has no known allergies.   Review of Systems Review of Systems  Unable to perform ROS: Dementia     Physical Exam Updated Vital Signs BP (!) 189/116   Pulse 89   Temp 98.4 F (36.9 C) (Oral)   Resp 18   Ht 5\' 6"  (1.676 m)   Wt 111 lb (50.3 kg)   SpO2 94%   BMI 17.92 kg/m   Physical Exam  Constitutional: She appears well-developed and well-nourished.  HENT:  Head: Normocephalic.  Patient has a hematoma and overlying abrasion to the  right parietal area.  Eyes: Pupils are equal, round, and reactive to light.  Neck:  A towel roll is in place around her neck. There is no obvious tenderness to the cervical thoracic or lumbosacral spine  Cardiovascular: Normal rate, regular rhythm and normal heart sounds.   Pulmonary/Chest: Effort normal and breath sounds normal. No respiratory distress. She has no wheezes. She has no rales. She exhibits no tenderness.  Abdominal: Soft. Bowel sounds are normal. There is no tenderness. There is no rebound and no guarding.  Musculoskeletal: Normal range of motion. She exhibits no edema.  No pain  on palpation or range of motion extremities, including the hips  Lymphadenopathy:    She has no cervical adenopathy.  Neurological: She is alert.  Patient has 5 out of 5 strength in all extremities and normal sensation to light touch in all extremities  Skin: Skin is warm and dry. No rash noted.  Psychiatric: She has a normal mood and affect.     ED Treatments / Results  Labs (all labs ordered are listed, but only abnormal results are displayed) Labs Reviewed  URINALYSIS, ROUTINE W REFLEX MICROSCOPIC (NOT AT [facility]) - Abnormal; Notable for the following:       Result Value   Ketones, ur 15 (*)    All other components within normal limits    EKG  EKG Interpretation  Date/Time:  Sunday February 22 2016 20:21:25 EST Ventricular Rate:  88 PR Interval:    QRS Duration: 86 QT Interval:  364 QTC Calculation: 441 R Axis:   53 Text Interpretation:  Atrial fibrillation Ventricular premature complex Repol abnrm, severe global ischemia (LM/MVD) since last tracing no significant change Confirmed by Detrick Dani  MD, Alper Guilmette ([id number]) on 02/22/2016 8:51:16 PM       Radiology Ct Head Wo Contrast  Result Date: 02/22/2016 CLINICAL DATA:  Status post fall backwards today with a blow scratch the status post fall today with a blow to the back of the head. Initial encounter. EXAM: CT HEAD WITHOUT CONTRAST CT  CERVICAL SPINE WITHOUT CONTRAST TECHNIQUE: Multidetector CT imaging of the head and cervical spine was performed following the standard protocol without intravenous contrast. Multiplanar CT image reconstructions of the cervical spine were also generated. COMPARISON:  Head and cervical spine CT scan 11/23/2015. FINDINGS: CT HEAD FINDINGS Brain: Cortical atrophy and chronic microvascular ischemic change are seen. No evidence of acute intracranial abnormality including hemorrhage, infarct, mass lesion, mass effect, no midline shift or abnormal extra-axial fluid collection. No hydrocephalus or pneumocephalus. Vascular: Atherosclerotic vascular disease noted. Skull: Intact. Sinuses/Orbits: Mild mucosal thickening left sphenoid sinus noted. Other: Large scalp hematoma on the right is identified. CT CERVICAL SPINE FINDINGS Alignment: Facet mediated 0.3 cm anterolisthesis C7 on T1 is unchanged. Otherwise unremarkable. Skull base and vertebrae: No acute fracture. No primary bone lesion or focal pathologic process. Soft tissues and spinal canal: No prevertebral fluid or swelling. No visible canal hematoma. Disc levels: Loss of disc space height appears worst at C4-5 and C5-6 and unchanged. Upper chest: Lung apices are clear. Other: Atherosclerotic vascular disease noted. IMPRESSION: Large scalp hematoma on the right without underlying fracture or acute intracranial abnormality. Atrophy and chronic microvascular ischemic change. No acute abnormality cervical spine. Atherosclerosis. Cervical spondylosis. Electronically Signed   By: Inge Rise M.D.   On: 02/22/2016 20:20   Ct Cervical Spine Wo Contrast  Result Date: 02/22/2016 CLINICAL DATA:  Status post fall backwards today with a blow scratch the status post fall today with a blow to the back of the head. Initial encounter. EXAM: CT HEAD WITHOUT CONTRAST CT CERVICAL SPINE WITHOUT CONTRAST TECHNIQUE: Multidetector CT imaging of the head and cervical spine was  performed following the standard protocol without intravenous contrast. Multiplanar CT image reconstructions of the cervical spine were also generated. COMPARISON:  Head and cervical spine CT scan 11/23/2015. FINDINGS: CT HEAD FINDINGS Brain: Cortical atrophy and chronic microvascular ischemic change are seen. No evidence of acute intracranial abnormality including hemorrhage, infarct, mass lesion, mass effect, no midline shift or abnormal extra-axial fluid collection. No hydrocephalus or pneumocephalus. Vascular: Atherosclerotic vascular disease noted. Skull:  Intact. Sinuses/Orbits: Mild mucosal thickening left sphenoid sinus noted. Other: Large scalp hematoma on the right is identified. CT CERVICAL SPINE FINDINGS Alignment: Facet mediated 0.3 cm anterolisthesis C7 on T1 is unchanged. Otherwise unremarkable. Skull base and vertebrae: No acute fracture. No primary bone lesion or focal pathologic process. Soft tissues and spinal canal: No prevertebral fluid or swelling. No visible canal hematoma. Disc levels: Loss of disc space height appears worst at C4-5 and C5-6 and unchanged. Upper chest: Lung apices are clear. Other: Atherosclerotic vascular disease noted. IMPRESSION: Large scalp hematoma on the right without underlying fracture or acute intracranial abnormality. Atrophy and chronic microvascular ischemic change. No acute abnormality cervical spine. Atherosclerosis. Cervical spondylosis. Electronically Signed   By: Inge Rise M.D.   On: 02/22/2016 20:20    Procedures Procedures (including critical care time)  Medications Ordered in ED Medications - No data to display   Initial Impression / Assessment and Plan / ED Course  I have reviewed the triage vital signs and the nursing notes.  Pertinent labs & imaging results that were available during my care of the patient were reviewed by me and considered in my medical decision making (see chart for details).  Clinical Course     Patient had a  CT scan of her head and cervical spine which were negative for acute injury other than a scalp hematoma. No intracranial hemorrhage is noted. The wound was cleaned.  There is no areas that appear to need suturing. There is no evidence of a UTI. Her EKG doesn't show any arrhythmias or ischemic changes. I spoke with Dr. Ardeth Perfect with [facility] regarding holding the Eliquis. I will advise the nursing facility to hold tomorrow's dose of Eliquis and consult with Dr. Forde Dandy prior to restarting it. Strict return precautions were given.  Final Clinical Impressions(s) / ED Diagnoses   Final diagnoses:  Fall, initial encounter  Contusion of scalp, initial encounter    New Prescriptions New Prescriptions   No medications on file     Malvin Johns, MD 02/22/16 2307

## 2016-02-22 NOTE — ED Triage Notes (Addendum)
Per EMS, pt from [facility].  Pt had an unwitnessed fall causing a laceration to right temporal area.  A/O x 4.  Denies pain.  Pt stated that she was having numbness and tingling on right side en route but has now subsided. Pt had a stroke one week ago.  Pt fell in bathroom.  148/80; 94HR, AFIB, 99% RA, CBG 127.

## 2016-02-22 NOTE — ED Notes (Signed)
Patient continued to try to cough/spit up phlegm - which family member reports is normal for this patient. Family member reported that she suctions patient when she can cough it up to the back of her throat. Notified Dr. Tamera Punt that patient needs suctioning. MD agreeable. Suctioning set up. Patient requested family member to suction her. Suctioning performed successfully.

## 2016-02-22 NOTE — Discharge Instructions (Signed)
HOLD THE PATIENT'S ELIQUIS FOR TOMORROW 02/23/16.  DISCUSS WITH DR. Forde Dandy PRIOR TO RESTARTING IT.  RETURN TO THE EMERGENCY DEPARTMENT FOR ANY WORSENING SYMPTOMS OR CHANGE IN MENTAL STATUS

## 2016-02-23 DIAGNOSIS — I482 Chronic atrial fibrillation: Secondary | ICD-10-CM | POA: Diagnosis not present

## 2016-02-23 DIAGNOSIS — J449 Chronic obstructive pulmonary disease, unspecified: Secondary | ICD-10-CM | POA: Diagnosis not present

## 2016-02-23 DIAGNOSIS — I638 Other cerebral infarction: Secondary | ICD-10-CM | POA: Diagnosis not present

## 2016-02-23 DIAGNOSIS — I2782 Chronic pulmonary embolism: Secondary | ICD-10-CM | POA: Diagnosis not present

## 2016-02-23 DIAGNOSIS — F0281 Dementia in other diseases classified elsewhere with behavioral disturbance: Secondary | ICD-10-CM | POA: Diagnosis not present

## 2016-02-23 DIAGNOSIS — I2510 Atherosclerotic heart disease of native coronary artery without angina pectoris: Secondary | ICD-10-CM | POA: Diagnosis not present

## 2016-02-23 DIAGNOSIS — R1319 Other dysphagia: Secondary | ICD-10-CM | POA: Diagnosis not present

## 2016-02-23 DIAGNOSIS — R945 Abnormal results of liver function studies: Secondary | ICD-10-CM | POA: Diagnosis not present

## 2016-02-24 DIAGNOSIS — F0390 Unspecified dementia without behavioral disturbance: Secondary | ICD-10-CM | POA: Diagnosis not present

## 2016-02-24 DIAGNOSIS — I639 Cerebral infarction, unspecified: Secondary | ICD-10-CM | POA: Diagnosis not present

## 2016-02-24 DIAGNOSIS — I2510 Atherosclerotic heart disease of native coronary artery without angina pectoris: Secondary | ICD-10-CM | POA: Diagnosis not present

## 2016-02-24 DIAGNOSIS — I4891 Unspecified atrial fibrillation: Secondary | ICD-10-CM | POA: Diagnosis not present

## 2016-03-03 ENCOUNTER — Encounter: Payer: Self-pay | Admitting: *Deleted

## 2016-03-03 DIAGNOSIS — I4891 Unspecified atrial fibrillation: Secondary | ICD-10-CM | POA: Diagnosis not present

## 2016-03-03 DIAGNOSIS — M79604 Pain in right leg: Secondary | ICD-10-CM | POA: Diagnosis not present

## 2016-03-03 DIAGNOSIS — F0390 Unspecified dementia without behavioral disturbance: Secondary | ICD-10-CM | POA: Diagnosis not present

## 2016-03-03 DIAGNOSIS — I639 Cerebral infarction, unspecified: Secondary | ICD-10-CM | POA: Diagnosis not present

## 2016-03-03 NOTE — Patient Outreach (Signed)
   [facility]) Care Management Post-Acute Care Coordination  03/03/2016  Nancy [name] 1925-06-27 [id number]   Met with [name], discharge planner for [name],  Reviewed patient case. She reports patient will discharge 03/05/16 to [facility] in [place] to be near family.  No [facility] program needs identified.  RNCM will sign off case.  Royetta Crochet. Laymond Purser, RN, BSN, [facility] Post-Acute Care Coordinator [phone]

## 2016-03-10 DIAGNOSIS — R262 Difficulty in walking, not elsewhere classified: Secondary | ICD-10-CM | POA: Diagnosis not present

## 2016-03-10 DIAGNOSIS — R296 Repeated falls: Secondary | ICD-10-CM | POA: Diagnosis not present

## 2016-03-10 DIAGNOSIS — R479 Unspecified speech disturbances: Secondary | ICD-10-CM | POA: Diagnosis not present

## 2016-03-10 DIAGNOSIS — R1312 Dysphagia, oropharyngeal phase: Secondary | ICD-10-CM | POA: Diagnosis not present

## 2016-03-10 DIAGNOSIS — R278 Other lack of coordination: Secondary | ICD-10-CM | POA: Diagnosis not present

## 2016-03-10 DIAGNOSIS — I6930 Unspecified sequelae of cerebral infarction: Secondary | ICD-10-CM | POA: Diagnosis not present

## 2016-03-12 DIAGNOSIS — R262 Difficulty in walking, not elsewhere classified: Secondary | ICD-10-CM | POA: Diagnosis not present

## 2016-03-12 DIAGNOSIS — R1312 Dysphagia, oropharyngeal phase: Secondary | ICD-10-CM | POA: Diagnosis not present

## 2016-03-12 DIAGNOSIS — R278 Other lack of coordination: Secondary | ICD-10-CM | POA: Diagnosis not present

## 2016-03-12 DIAGNOSIS — R479 Unspecified speech disturbances: Secondary | ICD-10-CM | POA: Diagnosis not present

## 2016-03-12 DIAGNOSIS — I6930 Unspecified sequelae of cerebral infarction: Secondary | ICD-10-CM | POA: Diagnosis not present

## 2016-03-12 DIAGNOSIS — R296 Repeated falls: Secondary | ICD-10-CM | POA: Diagnosis not present

## 2016-03-15 DIAGNOSIS — R296 Repeated falls: Secondary | ICD-10-CM | POA: Diagnosis not present

## 2016-03-15 DIAGNOSIS — R278 Other lack of coordination: Secondary | ICD-10-CM | POA: Diagnosis not present

## 2016-03-15 DIAGNOSIS — R262 Difficulty in walking, not elsewhere classified: Secondary | ICD-10-CM | POA: Diagnosis not present

## 2016-03-15 DIAGNOSIS — R479 Unspecified speech disturbances: Secondary | ICD-10-CM | POA: Diagnosis not present

## 2016-03-15 DIAGNOSIS — R1312 Dysphagia, oropharyngeal phase: Secondary | ICD-10-CM | POA: Diagnosis not present

## 2016-03-15 DIAGNOSIS — I6930 Unspecified sequelae of cerebral infarction: Secondary | ICD-10-CM | POA: Diagnosis not present

## 2016-03-16 DIAGNOSIS — R262 Difficulty in walking, not elsewhere classified: Secondary | ICD-10-CM | POA: Diagnosis not present

## 2016-03-16 DIAGNOSIS — R1312 Dysphagia, oropharyngeal phase: Secondary | ICD-10-CM | POA: Diagnosis not present

## 2016-03-16 DIAGNOSIS — R296 Repeated falls: Secondary | ICD-10-CM | POA: Diagnosis not present

## 2016-03-16 DIAGNOSIS — R479 Unspecified speech disturbances: Secondary | ICD-10-CM | POA: Diagnosis not present

## 2016-03-16 DIAGNOSIS — R278 Other lack of coordination: Secondary | ICD-10-CM | POA: Diagnosis not present

## 2016-03-16 DIAGNOSIS — I6930 Unspecified sequelae of cerebral infarction: Secondary | ICD-10-CM | POA: Diagnosis not present

## 2016-03-17 DIAGNOSIS — R479 Unspecified speech disturbances: Secondary | ICD-10-CM | POA: Diagnosis not present

## 2016-03-17 DIAGNOSIS — R1312 Dysphagia, oropharyngeal phase: Secondary | ICD-10-CM | POA: Diagnosis not present

## 2016-03-17 DIAGNOSIS — I6930 Unspecified sequelae of cerebral infarction: Secondary | ICD-10-CM | POA: Diagnosis not present

## 2016-03-17 DIAGNOSIS — R296 Repeated falls: Secondary | ICD-10-CM | POA: Diagnosis not present

## 2016-03-17 DIAGNOSIS — R278 Other lack of coordination: Secondary | ICD-10-CM | POA: Diagnosis not present

## 2016-03-17 DIAGNOSIS — R262 Difficulty in walking, not elsewhere classified: Secondary | ICD-10-CM | POA: Diagnosis not present

## 2016-03-18 DIAGNOSIS — R278 Other lack of coordination: Secondary | ICD-10-CM | POA: Diagnosis not present

## 2016-03-18 DIAGNOSIS — I6930 Unspecified sequelae of cerebral infarction: Secondary | ICD-10-CM | POA: Diagnosis not present

## 2016-03-18 DIAGNOSIS — R262 Difficulty in walking, not elsewhere classified: Secondary | ICD-10-CM | POA: Diagnosis not present

## 2016-03-18 DIAGNOSIS — R479 Unspecified speech disturbances: Secondary | ICD-10-CM | POA: Diagnosis not present

## 2016-03-18 DIAGNOSIS — R1312 Dysphagia, oropharyngeal phase: Secondary | ICD-10-CM | POA: Diagnosis not present

## 2016-03-18 DIAGNOSIS — R296 Repeated falls: Secondary | ICD-10-CM | POA: Diagnosis not present

## 2016-03-22 DIAGNOSIS — R1312 Dysphagia, oropharyngeal phase: Secondary | ICD-10-CM | POA: Diagnosis not present

## 2016-03-22 DIAGNOSIS — R479 Unspecified speech disturbances: Secondary | ICD-10-CM | POA: Diagnosis not present

## 2016-03-22 DIAGNOSIS — R262 Difficulty in walking, not elsewhere classified: Secondary | ICD-10-CM | POA: Diagnosis not present

## 2016-03-22 DIAGNOSIS — I6930 Unspecified sequelae of cerebral infarction: Secondary | ICD-10-CM | POA: Diagnosis not present

## 2016-03-22 DIAGNOSIS — R278 Other lack of coordination: Secondary | ICD-10-CM | POA: Diagnosis not present

## 2016-03-22 DIAGNOSIS — R296 Repeated falls: Secondary | ICD-10-CM | POA: Diagnosis not present

## 2016-03-23 DIAGNOSIS — F0390 Unspecified dementia without behavioral disturbance: Secondary | ICD-10-CM | POA: Diagnosis not present

## 2016-03-23 DIAGNOSIS — R9082 White matter disease, unspecified: Secondary | ICD-10-CM | POA: Diagnosis not present

## 2016-03-23 DIAGNOSIS — M545 Low back pain: Secondary | ICD-10-CM | POA: Diagnosis not present

## 2016-03-23 DIAGNOSIS — W1830XA Fall on same level, unspecified, initial encounter: Secondary | ICD-10-CM | POA: Diagnosis not present

## 2016-03-23 DIAGNOSIS — R918 Other nonspecific abnormal finding of lung field: Secondary | ICD-10-CM | POA: Diagnosis not present

## 2016-03-23 DIAGNOSIS — W228XXA Striking against or struck by other objects, initial encounter: Secondary | ICD-10-CM | POA: Diagnosis not present

## 2016-03-23 DIAGNOSIS — I517 Cardiomegaly: Secondary | ICD-10-CM | POA: Diagnosis not present

## 2016-03-23 DIAGNOSIS — G8911 Acute pain due to trauma: Secondary | ICD-10-CM | POA: Diagnosis not present

## 2016-03-23 DIAGNOSIS — W19XXXA Unspecified fall, initial encounter: Secondary | ICD-10-CM | POA: Diagnosis not present

## 2016-03-23 DIAGNOSIS — Z043 Encounter for examination and observation following other accident: Secondary | ICD-10-CM | POA: Diagnosis not present

## 2016-03-23 DIAGNOSIS — I672 Cerebral atherosclerosis: Secondary | ICD-10-CM | POA: Diagnosis not present

## 2016-03-23 DIAGNOSIS — R109 Unspecified abdominal pain: Secondary | ICD-10-CM | POA: Diagnosis not present

## 2016-03-23 DIAGNOSIS — I482 Chronic atrial fibrillation: Secondary | ICD-10-CM | POA: Diagnosis not present

## 2016-03-23 DIAGNOSIS — D259 Leiomyoma of uterus, unspecified: Secondary | ICD-10-CM | POA: Diagnosis not present

## 2016-03-23 DIAGNOSIS — M25552 Pain in left hip: Secondary | ICD-10-CM | POA: Diagnosis not present

## 2016-03-23 DIAGNOSIS — R102 Pelvic and perineal pain: Secondary | ICD-10-CM | POA: Diagnosis not present

## 2016-03-24 DIAGNOSIS — R479 Unspecified speech disturbances: Secondary | ICD-10-CM | POA: Diagnosis not present

## 2016-03-24 DIAGNOSIS — R296 Repeated falls: Secondary | ICD-10-CM | POA: Diagnosis not present

## 2016-03-24 DIAGNOSIS — R278 Other lack of coordination: Secondary | ICD-10-CM | POA: Diagnosis not present

## 2016-03-24 DIAGNOSIS — I6930 Unspecified sequelae of cerebral infarction: Secondary | ICD-10-CM | POA: Diagnosis not present

## 2016-03-24 DIAGNOSIS — R1312 Dysphagia, oropharyngeal phase: Secondary | ICD-10-CM | POA: Diagnosis not present

## 2016-03-24 DIAGNOSIS — R262 Difficulty in walking, not elsewhere classified: Secondary | ICD-10-CM | POA: Diagnosis not present

## 2016-03-25 DIAGNOSIS — R262 Difficulty in walking, not elsewhere classified: Secondary | ICD-10-CM | POA: Diagnosis not present

## 2016-03-25 DIAGNOSIS — R296 Repeated falls: Secondary | ICD-10-CM | POA: Diagnosis not present

## 2016-03-25 DIAGNOSIS — I6930 Unspecified sequelae of cerebral infarction: Secondary | ICD-10-CM | POA: Diagnosis not present

## 2016-03-25 DIAGNOSIS — R278 Other lack of coordination: Secondary | ICD-10-CM | POA: Diagnosis not present

## 2016-03-25 DIAGNOSIS — R479 Unspecified speech disturbances: Secondary | ICD-10-CM | POA: Diagnosis not present

## 2016-03-25 DIAGNOSIS — R1312 Dysphagia, oropharyngeal phase: Secondary | ICD-10-CM | POA: Diagnosis not present

## 2016-03-26 DIAGNOSIS — R278 Other lack of coordination: Secondary | ICD-10-CM | POA: Diagnosis not present

## 2016-03-26 DIAGNOSIS — R262 Difficulty in walking, not elsewhere classified: Secondary | ICD-10-CM | POA: Diagnosis not present

## 2016-03-26 DIAGNOSIS — R296 Repeated falls: Secondary | ICD-10-CM | POA: Diagnosis not present

## 2016-03-26 DIAGNOSIS — R479 Unspecified speech disturbances: Secondary | ICD-10-CM | POA: Diagnosis not present

## 2016-03-26 DIAGNOSIS — I6930 Unspecified sequelae of cerebral infarction: Secondary | ICD-10-CM | POA: Diagnosis not present

## 2016-03-26 DIAGNOSIS — R1312 Dysphagia, oropharyngeal phase: Secondary | ICD-10-CM | POA: Diagnosis not present

## 2016-03-30 DIAGNOSIS — R479 Unspecified speech disturbances: Secondary | ICD-10-CM | POA: Diagnosis not present

## 2016-03-30 DIAGNOSIS — R296 Repeated falls: Secondary | ICD-10-CM | POA: Diagnosis not present

## 2016-03-30 DIAGNOSIS — R1312 Dysphagia, oropharyngeal phase: Secondary | ICD-10-CM | POA: Diagnosis not present

## 2016-03-30 DIAGNOSIS — R278 Other lack of coordination: Secondary | ICD-10-CM | POA: Diagnosis not present

## 2016-03-30 DIAGNOSIS — R262 Difficulty in walking, not elsewhere classified: Secondary | ICD-10-CM | POA: Diagnosis not present

## 2016-03-30 DIAGNOSIS — I6930 Unspecified sequelae of cerebral infarction: Secondary | ICD-10-CM | POA: Diagnosis not present

## 2016-03-31 DIAGNOSIS — N390 Urinary tract infection, site not specified: Secondary | ICD-10-CM | POA: Diagnosis not present

## 2016-03-31 DIAGNOSIS — N210 Calculus in bladder: Secondary | ICD-10-CM | POA: Diagnosis not present

## 2016-03-31 DIAGNOSIS — D252 Subserosal leiomyoma of uterus: Secondary | ICD-10-CM | POA: Diagnosis not present

## 2016-03-31 DIAGNOSIS — R319 Hematuria, unspecified: Secondary | ICD-10-CM | POA: Diagnosis not present

## 2016-04-01 DIAGNOSIS — R479 Unspecified speech disturbances: Secondary | ICD-10-CM | POA: Diagnosis not present

## 2016-04-01 DIAGNOSIS — R278 Other lack of coordination: Secondary | ICD-10-CM | POA: Diagnosis not present

## 2016-04-01 DIAGNOSIS — I6930 Unspecified sequelae of cerebral infarction: Secondary | ICD-10-CM | POA: Diagnosis not present

## 2016-04-01 DIAGNOSIS — R1312 Dysphagia, oropharyngeal phase: Secondary | ICD-10-CM | POA: Diagnosis not present

## 2016-04-01 DIAGNOSIS — R262 Difficulty in walking, not elsewhere classified: Secondary | ICD-10-CM | POA: Diagnosis not present

## 2016-04-01 DIAGNOSIS — R296 Repeated falls: Secondary | ICD-10-CM | POA: Diagnosis not present

## 2016-04-02 DIAGNOSIS — R479 Unspecified speech disturbances: Secondary | ICD-10-CM | POA: Diagnosis not present

## 2016-04-02 DIAGNOSIS — R262 Difficulty in walking, not elsewhere classified: Secondary | ICD-10-CM | POA: Diagnosis not present

## 2016-04-02 DIAGNOSIS — R1312 Dysphagia, oropharyngeal phase: Secondary | ICD-10-CM | POA: Diagnosis not present

## 2016-04-02 DIAGNOSIS — R296 Repeated falls: Secondary | ICD-10-CM | POA: Diagnosis not present

## 2016-04-02 DIAGNOSIS — I6930 Unspecified sequelae of cerebral infarction: Secondary | ICD-10-CM | POA: Diagnosis not present

## 2016-04-02 DIAGNOSIS — R278 Other lack of coordination: Secondary | ICD-10-CM | POA: Diagnosis not present

## 2016-04-06 DIAGNOSIS — R1312 Dysphagia, oropharyngeal phase: Secondary | ICD-10-CM | POA: Diagnosis not present

## 2016-04-06 DIAGNOSIS — I6930 Unspecified sequelae of cerebral infarction: Secondary | ICD-10-CM | POA: Diagnosis not present

## 2016-04-06 DIAGNOSIS — R296 Repeated falls: Secondary | ICD-10-CM | POA: Diagnosis not present

## 2016-04-06 DIAGNOSIS — R278 Other lack of coordination: Secondary | ICD-10-CM | POA: Diagnosis not present

## 2016-04-06 DIAGNOSIS — R479 Unspecified speech disturbances: Secondary | ICD-10-CM | POA: Diagnosis not present

## 2016-04-06 DIAGNOSIS — R262 Difficulty in walking, not elsewhere classified: Secondary | ICD-10-CM | POA: Diagnosis not present

## 2016-04-07 DIAGNOSIS — R278 Other lack of coordination: Secondary | ICD-10-CM | POA: Diagnosis not present

## 2016-04-07 DIAGNOSIS — R479 Unspecified speech disturbances: Secondary | ICD-10-CM | POA: Diagnosis not present

## 2016-04-07 DIAGNOSIS — R262 Difficulty in walking, not elsewhere classified: Secondary | ICD-10-CM | POA: Diagnosis not present

## 2016-04-07 DIAGNOSIS — R1312 Dysphagia, oropharyngeal phase: Secondary | ICD-10-CM | POA: Diagnosis not present

## 2016-04-07 DIAGNOSIS — R296 Repeated falls: Secondary | ICD-10-CM | POA: Diagnosis not present

## 2016-04-07 DIAGNOSIS — I6930 Unspecified sequelae of cerebral infarction: Secondary | ICD-10-CM | POA: Diagnosis not present

## 2016-04-08 DIAGNOSIS — I6930 Unspecified sequelae of cerebral infarction: Secondary | ICD-10-CM | POA: Diagnosis not present

## 2016-04-08 DIAGNOSIS — R479 Unspecified speech disturbances: Secondary | ICD-10-CM | POA: Diagnosis not present

## 2016-04-08 DIAGNOSIS — R296 Repeated falls: Secondary | ICD-10-CM | POA: Diagnosis not present

## 2016-04-08 DIAGNOSIS — R262 Difficulty in walking, not elsewhere classified: Secondary | ICD-10-CM | POA: Diagnosis not present

## 2016-04-08 DIAGNOSIS — R278 Other lack of coordination: Secondary | ICD-10-CM | POA: Diagnosis not present

## 2016-04-08 DIAGNOSIS — R1312 Dysphagia, oropharyngeal phase: Secondary | ICD-10-CM | POA: Diagnosis not present

## 2016-04-09 DIAGNOSIS — R479 Unspecified speech disturbances: Secondary | ICD-10-CM | POA: Diagnosis not present

## 2016-04-09 DIAGNOSIS — R278 Other lack of coordination: Secondary | ICD-10-CM | POA: Diagnosis not present

## 2016-04-09 DIAGNOSIS — I6930 Unspecified sequelae of cerebral infarction: Secondary | ICD-10-CM | POA: Diagnosis not present

## 2016-04-09 DIAGNOSIS — R296 Repeated falls: Secondary | ICD-10-CM | POA: Diagnosis not present

## 2016-04-09 DIAGNOSIS — R1312 Dysphagia, oropharyngeal phase: Secondary | ICD-10-CM | POA: Diagnosis not present

## 2016-04-09 DIAGNOSIS — R262 Difficulty in walking, not elsewhere classified: Secondary | ICD-10-CM | POA: Diagnosis not present

## 2016-04-12 DIAGNOSIS — R1312 Dysphagia, oropharyngeal phase: Secondary | ICD-10-CM | POA: Diagnosis not present

## 2016-04-12 DIAGNOSIS — R262 Difficulty in walking, not elsewhere classified: Secondary | ICD-10-CM | POA: Diagnosis not present

## 2016-04-12 DIAGNOSIS — I6930 Unspecified sequelae of cerebral infarction: Secondary | ICD-10-CM | POA: Diagnosis not present

## 2016-04-12 DIAGNOSIS — R296 Repeated falls: Secondary | ICD-10-CM | POA: Diagnosis not present

## 2016-04-12 DIAGNOSIS — R278 Other lack of coordination: Secondary | ICD-10-CM | POA: Diagnosis not present

## 2016-04-12 DIAGNOSIS — R479 Unspecified speech disturbances: Secondary | ICD-10-CM | POA: Diagnosis not present

## 2016-04-13 DIAGNOSIS — I6930 Unspecified sequelae of cerebral infarction: Secondary | ICD-10-CM | POA: Diagnosis not present

## 2016-04-13 DIAGNOSIS — R1312 Dysphagia, oropharyngeal phase: Secondary | ICD-10-CM | POA: Diagnosis not present

## 2016-04-13 DIAGNOSIS — R262 Difficulty in walking, not elsewhere classified: Secondary | ICD-10-CM | POA: Diagnosis not present

## 2016-04-13 DIAGNOSIS — R479 Unspecified speech disturbances: Secondary | ICD-10-CM | POA: Diagnosis not present

## 2016-04-13 DIAGNOSIS — R296 Repeated falls: Secondary | ICD-10-CM | POA: Diagnosis not present

## 2016-04-13 DIAGNOSIS — R278 Other lack of coordination: Secondary | ICD-10-CM | POA: Diagnosis not present

## 2016-04-14 DIAGNOSIS — R479 Unspecified speech disturbances: Secondary | ICD-10-CM | POA: Diagnosis not present

## 2016-04-14 DIAGNOSIS — R296 Repeated falls: Secondary | ICD-10-CM | POA: Diagnosis not present

## 2016-04-14 DIAGNOSIS — R1312 Dysphagia, oropharyngeal phase: Secondary | ICD-10-CM | POA: Diagnosis not present

## 2016-04-14 DIAGNOSIS — R278 Other lack of coordination: Secondary | ICD-10-CM | POA: Diagnosis not present

## 2016-04-14 DIAGNOSIS — I6930 Unspecified sequelae of cerebral infarction: Secondary | ICD-10-CM | POA: Diagnosis not present

## 2016-04-14 DIAGNOSIS — R262 Difficulty in walking, not elsewhere classified: Secondary | ICD-10-CM | POA: Diagnosis not present

## 2016-04-15 DIAGNOSIS — R1312 Dysphagia, oropharyngeal phase: Secondary | ICD-10-CM | POA: Diagnosis not present

## 2016-04-15 DIAGNOSIS — R278 Other lack of coordination: Secondary | ICD-10-CM | POA: Diagnosis not present

## 2016-04-15 DIAGNOSIS — I6930 Unspecified sequelae of cerebral infarction: Secondary | ICD-10-CM | POA: Diagnosis not present

## 2016-04-15 DIAGNOSIS — R296 Repeated falls: Secondary | ICD-10-CM | POA: Diagnosis not present

## 2016-04-15 DIAGNOSIS — R479 Unspecified speech disturbances: Secondary | ICD-10-CM | POA: Diagnosis not present

## 2016-04-15 DIAGNOSIS — R262 Difficulty in walking, not elsewhere classified: Secondary | ICD-10-CM | POA: Diagnosis not present

## 2016-04-16 DIAGNOSIS — R296 Repeated falls: Secondary | ICD-10-CM | POA: Diagnosis not present

## 2016-04-16 DIAGNOSIS — R1312 Dysphagia, oropharyngeal phase: Secondary | ICD-10-CM | POA: Diagnosis not present

## 2016-04-16 DIAGNOSIS — I6930 Unspecified sequelae of cerebral infarction: Secondary | ICD-10-CM | POA: Diagnosis not present

## 2016-04-16 DIAGNOSIS — R479 Unspecified speech disturbances: Secondary | ICD-10-CM | POA: Diagnosis not present

## 2016-04-16 DIAGNOSIS — R262 Difficulty in walking, not elsewhere classified: Secondary | ICD-10-CM | POA: Diagnosis not present

## 2016-04-16 DIAGNOSIS — R278 Other lack of coordination: Secondary | ICD-10-CM | POA: Diagnosis not present

## 2016-04-19 DIAGNOSIS — R262 Difficulty in walking, not elsewhere classified: Secondary | ICD-10-CM | POA: Diagnosis not present

## 2016-04-19 DIAGNOSIS — R296 Repeated falls: Secondary | ICD-10-CM | POA: Diagnosis not present

## 2016-04-19 DIAGNOSIS — R1312 Dysphagia, oropharyngeal phase: Secondary | ICD-10-CM | POA: Diagnosis not present

## 2016-04-19 DIAGNOSIS — R278 Other lack of coordination: Secondary | ICD-10-CM | POA: Diagnosis not present

## 2016-04-19 DIAGNOSIS — R479 Unspecified speech disturbances: Secondary | ICD-10-CM | POA: Diagnosis not present

## 2016-04-19 DIAGNOSIS — I6930 Unspecified sequelae of cerebral infarction: Secondary | ICD-10-CM | POA: Diagnosis not present

## 2016-04-20 DIAGNOSIS — R479 Unspecified speech disturbances: Secondary | ICD-10-CM | POA: Diagnosis not present

## 2016-04-20 DIAGNOSIS — R296 Repeated falls: Secondary | ICD-10-CM | POA: Diagnosis not present

## 2016-04-20 DIAGNOSIS — R262 Difficulty in walking, not elsewhere classified: Secondary | ICD-10-CM | POA: Diagnosis not present

## 2016-04-20 DIAGNOSIS — I6930 Unspecified sequelae of cerebral infarction: Secondary | ICD-10-CM | POA: Diagnosis not present

## 2016-04-20 DIAGNOSIS — R278 Other lack of coordination: Secondary | ICD-10-CM | POA: Diagnosis not present

## 2016-04-20 DIAGNOSIS — R1312 Dysphagia, oropharyngeal phase: Secondary | ICD-10-CM | POA: Diagnosis not present

## 2016-04-21 DIAGNOSIS — R278 Other lack of coordination: Secondary | ICD-10-CM | POA: Diagnosis not present

## 2016-04-21 DIAGNOSIS — R296 Repeated falls: Secondary | ICD-10-CM | POA: Diagnosis not present

## 2016-04-21 DIAGNOSIS — R262 Difficulty in walking, not elsewhere classified: Secondary | ICD-10-CM | POA: Diagnosis not present

## 2016-04-21 DIAGNOSIS — I6930 Unspecified sequelae of cerebral infarction: Secondary | ICD-10-CM | POA: Diagnosis not present

## 2016-04-21 DIAGNOSIS — R1312 Dysphagia, oropharyngeal phase: Secondary | ICD-10-CM | POA: Diagnosis not present

## 2016-04-21 DIAGNOSIS — R479 Unspecified speech disturbances: Secondary | ICD-10-CM | POA: Diagnosis not present

## 2016-04-22 DIAGNOSIS — R1312 Dysphagia, oropharyngeal phase: Secondary | ICD-10-CM | POA: Diagnosis not present

## 2016-04-22 DIAGNOSIS — I6930 Unspecified sequelae of cerebral infarction: Secondary | ICD-10-CM | POA: Diagnosis not present

## 2016-04-22 DIAGNOSIS — R262 Difficulty in walking, not elsewhere classified: Secondary | ICD-10-CM | POA: Diagnosis not present

## 2016-04-22 DIAGNOSIS — R278 Other lack of coordination: Secondary | ICD-10-CM | POA: Diagnosis not present

## 2016-04-22 DIAGNOSIS — R479 Unspecified speech disturbances: Secondary | ICD-10-CM | POA: Diagnosis not present

## 2016-04-22 DIAGNOSIS — R296 Repeated falls: Secondary | ICD-10-CM | POA: Diagnosis not present

## 2016-04-26 DIAGNOSIS — R278 Other lack of coordination: Secondary | ICD-10-CM | POA: Diagnosis not present

## 2016-04-26 DIAGNOSIS — I6930 Unspecified sequelae of cerebral infarction: Secondary | ICD-10-CM | POA: Diagnosis not present

## 2016-04-26 DIAGNOSIS — R296 Repeated falls: Secondary | ICD-10-CM | POA: Diagnosis not present

## 2016-04-26 DIAGNOSIS — R262 Difficulty in walking, not elsewhere classified: Secondary | ICD-10-CM | POA: Diagnosis not present

## 2016-04-26 DIAGNOSIS — R1312 Dysphagia, oropharyngeal phase: Secondary | ICD-10-CM | POA: Diagnosis not present

## 2016-04-26 DIAGNOSIS — R479 Unspecified speech disturbances: Secondary | ICD-10-CM | POA: Diagnosis not present

## 2016-04-27 DIAGNOSIS — R262 Difficulty in walking, not elsewhere classified: Secondary | ICD-10-CM | POA: Diagnosis not present

## 2016-04-27 DIAGNOSIS — R296 Repeated falls: Secondary | ICD-10-CM | POA: Diagnosis not present

## 2016-04-27 DIAGNOSIS — I6930 Unspecified sequelae of cerebral infarction: Secondary | ICD-10-CM | POA: Diagnosis not present

## 2016-04-27 DIAGNOSIS — R479 Unspecified speech disturbances: Secondary | ICD-10-CM | POA: Diagnosis not present

## 2016-04-27 DIAGNOSIS — R1312 Dysphagia, oropharyngeal phase: Secondary | ICD-10-CM | POA: Diagnosis not present

## 2016-04-27 DIAGNOSIS — R278 Other lack of coordination: Secondary | ICD-10-CM | POA: Diagnosis not present

## 2016-04-28 DIAGNOSIS — I6930 Unspecified sequelae of cerebral infarction: Secondary | ICD-10-CM | POA: Diagnosis not present

## 2016-04-28 DIAGNOSIS — R278 Other lack of coordination: Secondary | ICD-10-CM | POA: Diagnosis not present

## 2016-04-28 DIAGNOSIS — R262 Difficulty in walking, not elsewhere classified: Secondary | ICD-10-CM | POA: Diagnosis not present

## 2016-04-28 DIAGNOSIS — R479 Unspecified speech disturbances: Secondary | ICD-10-CM | POA: Diagnosis not present

## 2016-04-28 DIAGNOSIS — R1312 Dysphagia, oropharyngeal phase: Secondary | ICD-10-CM | POA: Diagnosis not present

## 2016-04-28 DIAGNOSIS — R296 Repeated falls: Secondary | ICD-10-CM | POA: Diagnosis not present

## 2016-04-29 DIAGNOSIS — R479 Unspecified speech disturbances: Secondary | ICD-10-CM | POA: Diagnosis not present

## 2016-04-29 DIAGNOSIS — I6930 Unspecified sequelae of cerebral infarction: Secondary | ICD-10-CM | POA: Diagnosis not present

## 2016-04-29 DIAGNOSIS — R278 Other lack of coordination: Secondary | ICD-10-CM | POA: Diagnosis not present

## 2016-04-29 DIAGNOSIS — R1312 Dysphagia, oropharyngeal phase: Secondary | ICD-10-CM | POA: Diagnosis not present

## 2016-04-29 DIAGNOSIS — R262 Difficulty in walking, not elsewhere classified: Secondary | ICD-10-CM | POA: Diagnosis not present

## 2016-04-29 DIAGNOSIS — R296 Repeated falls: Secondary | ICD-10-CM | POA: Diagnosis not present

## 2016-05-04 DIAGNOSIS — I6930 Unspecified sequelae of cerebral infarction: Secondary | ICD-10-CM | POA: Diagnosis not present

## 2016-05-04 DIAGNOSIS — R1312 Dysphagia, oropharyngeal phase: Secondary | ICD-10-CM | POA: Diagnosis not present

## 2016-05-04 DIAGNOSIS — R479 Unspecified speech disturbances: Secondary | ICD-10-CM | POA: Diagnosis not present

## 2016-05-04 DIAGNOSIS — R296 Repeated falls: Secondary | ICD-10-CM | POA: Diagnosis not present

## 2016-05-04 DIAGNOSIS — R278 Other lack of coordination: Secondary | ICD-10-CM | POA: Diagnosis not present

## 2016-05-04 DIAGNOSIS — R262 Difficulty in walking, not elsewhere classified: Secondary | ICD-10-CM | POA: Diagnosis not present

## 2016-05-05 DIAGNOSIS — I6930 Unspecified sequelae of cerebral infarction: Secondary | ICD-10-CM | POA: Diagnosis not present

## 2016-05-05 DIAGNOSIS — R296 Repeated falls: Secondary | ICD-10-CM | POA: Diagnosis not present

## 2016-05-05 DIAGNOSIS — R1312 Dysphagia, oropharyngeal phase: Secondary | ICD-10-CM | POA: Diagnosis not present

## 2016-05-05 DIAGNOSIS — R278 Other lack of coordination: Secondary | ICD-10-CM | POA: Diagnosis not present

## 2016-05-05 DIAGNOSIS — R479 Unspecified speech disturbances: Secondary | ICD-10-CM | POA: Diagnosis not present

## 2016-05-05 DIAGNOSIS — R262 Difficulty in walking, not elsewhere classified: Secondary | ICD-10-CM | POA: Diagnosis not present

## 2016-05-07 DIAGNOSIS — R479 Unspecified speech disturbances: Secondary | ICD-10-CM | POA: Diagnosis not present

## 2016-05-07 DIAGNOSIS — R1312 Dysphagia, oropharyngeal phase: Secondary | ICD-10-CM | POA: Diagnosis not present

## 2016-05-07 DIAGNOSIS — I6930 Unspecified sequelae of cerebral infarction: Secondary | ICD-10-CM | POA: Diagnosis not present

## 2016-05-13 DIAGNOSIS — I6930 Unspecified sequelae of cerebral infarction: Secondary | ICD-10-CM | POA: Diagnosis not present

## 2016-05-13 DIAGNOSIS — R479 Unspecified speech disturbances: Secondary | ICD-10-CM | POA: Diagnosis not present

## 2016-05-13 DIAGNOSIS — R1312 Dysphagia, oropharyngeal phase: Secondary | ICD-10-CM | POA: Diagnosis not present

## 2016-05-22 DIAGNOSIS — R627 Adult failure to thrive: Secondary | ICD-10-CM | POA: Diagnosis present

## 2016-05-22 DIAGNOSIS — R4020 Unspecified coma: Secondary | ICD-10-CM | POA: Diagnosis not present

## 2016-05-22 DIAGNOSIS — I2510 Atherosclerotic heart disease of native coronary artery without angina pectoris: Secondary | ICD-10-CM | POA: Diagnosis present

## 2016-05-22 DIAGNOSIS — I252 Old myocardial infarction: Secondary | ICD-10-CM | POA: Diagnosis not present

## 2016-05-22 DIAGNOSIS — I509 Heart failure, unspecified: Secondary | ICD-10-CM | POA: Diagnosis present

## 2016-05-22 DIAGNOSIS — I63312 Cerebral infarction due to thrombosis of left middle cerebral artery: Secondary | ICD-10-CM | POA: Diagnosis not present

## 2016-05-22 DIAGNOSIS — Z9911 Dependence on respirator [ventilator] status: Secondary | ICD-10-CM | POA: Diagnosis not present

## 2016-05-22 DIAGNOSIS — R401 Stupor: Secondary | ICD-10-CM | POA: Diagnosis not present

## 2016-05-22 DIAGNOSIS — E785 Hyperlipidemia, unspecified: Secondary | ICD-10-CM | POA: Diagnosis present

## 2016-05-22 DIAGNOSIS — F0390 Unspecified dementia without behavioral disturbance: Secondary | ICD-10-CM | POA: Diagnosis present

## 2016-05-22 DIAGNOSIS — Z66 Do not resuscitate: Secondary | ICD-10-CM | POA: Diagnosis present

## 2016-05-22 DIAGNOSIS — J9600 Acute respiratory failure, unspecified whether with hypoxia or hypercapnia: Secondary | ICD-10-CM | POA: Diagnosis not present

## 2016-05-22 DIAGNOSIS — R402212 Coma scale, best verbal response, none, at arrival to emergency department: Secondary | ICD-10-CM | POA: Diagnosis not present

## 2016-05-22 DIAGNOSIS — R402431 Glasgow coma scale score 3-8, in the field [EMT or ambulance]: Secondary | ICD-10-CM | POA: Diagnosis not present

## 2016-05-22 DIAGNOSIS — Z515 Encounter for palliative care: Secondary | ICD-10-CM | POA: Diagnosis present

## 2016-05-22 DIAGNOSIS — N189 Chronic kidney disease, unspecified: Secondary | ICD-10-CM | POA: Diagnosis present

## 2016-05-22 DIAGNOSIS — R402312 Coma scale, best motor response, none, at arrival to emergency department: Secondary | ICD-10-CM | POA: Diagnosis present

## 2016-05-22 DIAGNOSIS — I214 Non-ST elevation (NSTEMI) myocardial infarction: Secondary | ICD-10-CM | POA: Diagnosis present

## 2016-05-22 DIAGNOSIS — R9431 Abnormal electrocardiogram [ECG] [EKG]: Secondary | ICD-10-CM | POA: Diagnosis not present

## 2016-05-22 DIAGNOSIS — I4891 Unspecified atrial fibrillation: Secondary | ICD-10-CM | POA: Diagnosis not present

## 2016-05-22 DIAGNOSIS — R402112 Coma scale, eyes open, never, at arrival to emergency department: Secondary | ICD-10-CM | POA: Diagnosis not present

## 2016-05-22 DIAGNOSIS — I213 ST elevation (STEMI) myocardial infarction of unspecified site: Secondary | ICD-10-CM | POA: Diagnosis not present

## 2016-05-22 DIAGNOSIS — N179 Acute kidney failure, unspecified: Secondary | ICD-10-CM | POA: Diagnosis present

## 2016-05-22 DIAGNOSIS — I6782 Cerebral ischemia: Secondary | ICD-10-CM | POA: Diagnosis not present

## 2016-05-22 DIAGNOSIS — I672 Cerebral atherosclerosis: Secondary | ICD-10-CM | POA: Diagnosis not present

## 2016-06-03 DEATH — deceased

## 2017-08-09 IMAGING — MR MR HEAD W/O CM
9 of 10 series · 36 of 48 positions shown · non-contrast
Comparison: MRI head 02/16/2006

CLINICAL DATA: Balance problems.

EXAM:
MRI HEAD WITHOUT CONTRAST
TECHNIQUE: Multiplanar, multiecho pulse sequences of the brain and surrounding
structures were obtained without intravenous contrast.

[Series 4: DWI · axial · 3.0mm · 1.09mm/px · z∈[-91,+42]mm · 9 of 96 slices shown (1 of 4)]
[im 1/96]
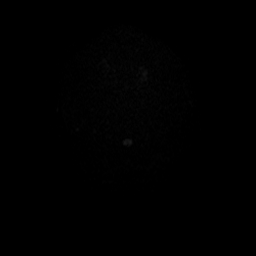
[im 12/96]
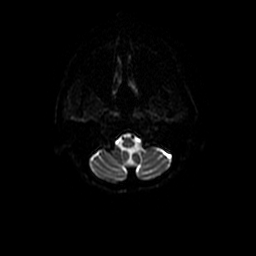
[im 24/96]
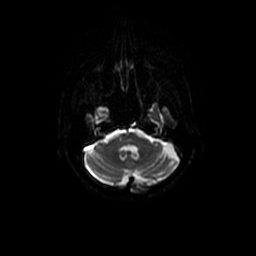
[im 36/96]
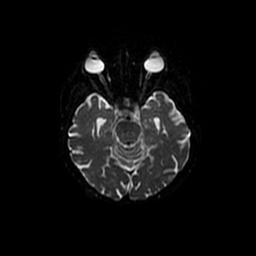
[im 48/96]
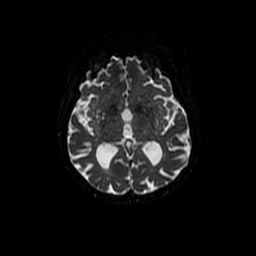
[im 60/96]
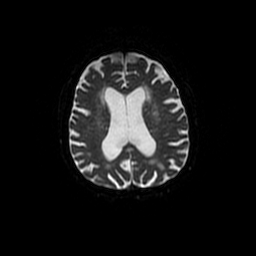
[im 72/96]
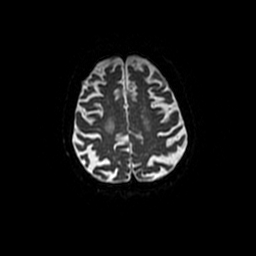
[im 84/96]
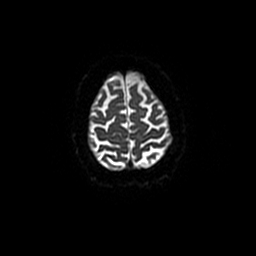
[im 96/96]
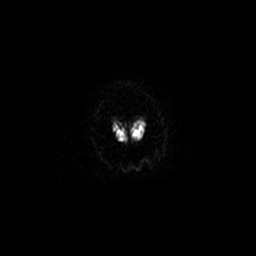

[Series 5: T1 · sagittal · 5.0mm · 0.47mm/px · 2 of 23 slices shown]
[im 1/23]
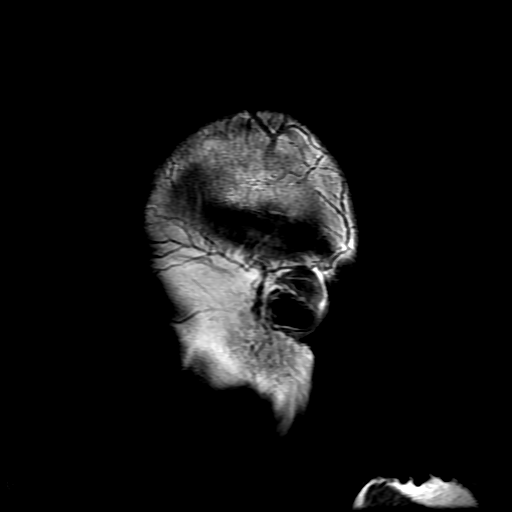
[im 23/23]
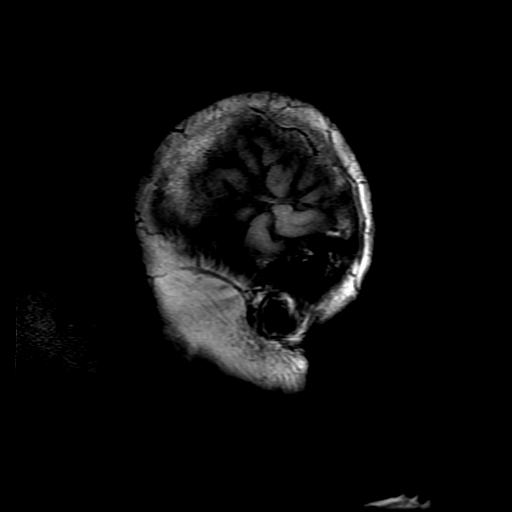

[Series 6: T2 · axial · 5.0mm · 0.43mm/px · z∈[-80,+52]mm · 3 of 24 slices shown (1 of 2)]
[im 1/24]
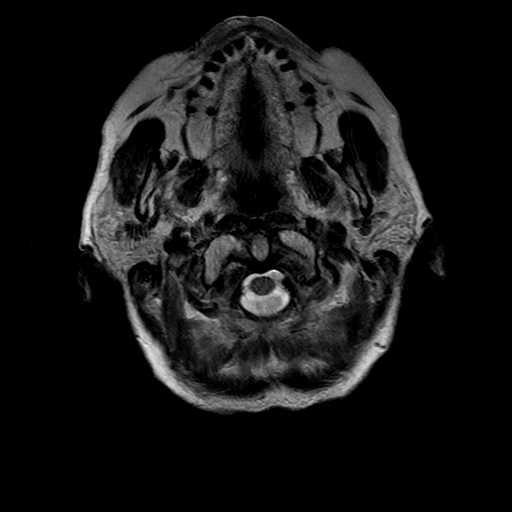
[im 12/24]
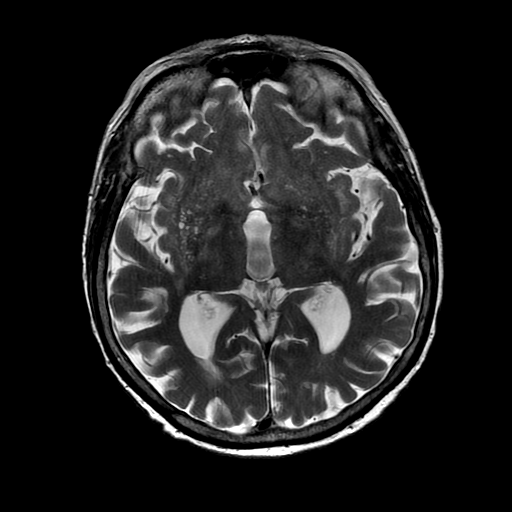
[im 24/24]
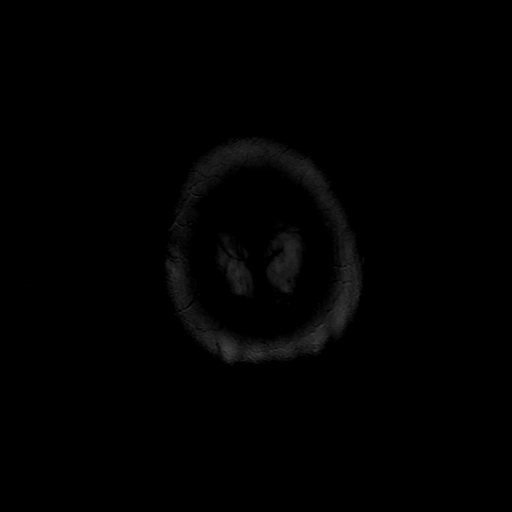

[Series 7: FLAIR · axial · 5.0mm · 0.43mm/px · z∈[-80,+52]mm · 3 of 24 slices shown]
[im 1/24]
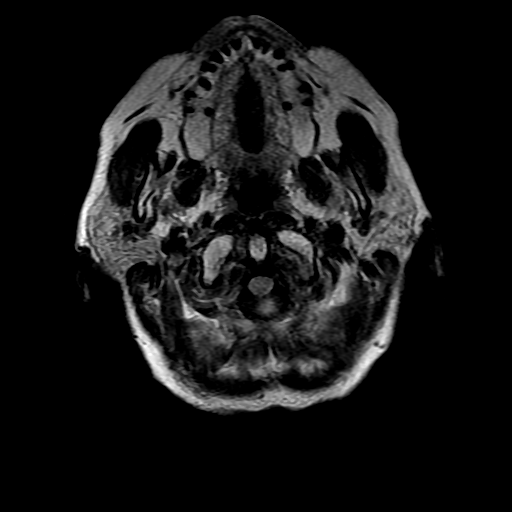
[im 12/24]
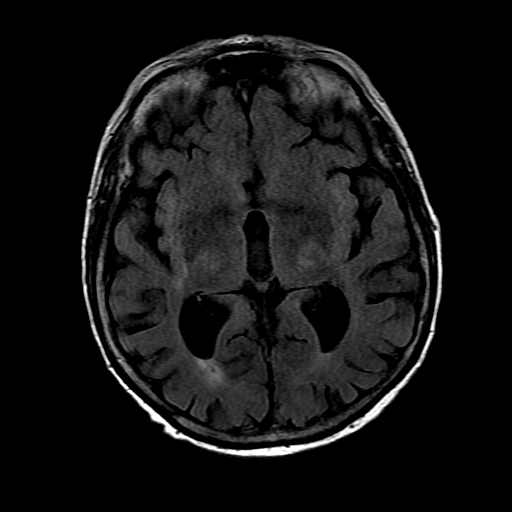
[im 24/24]
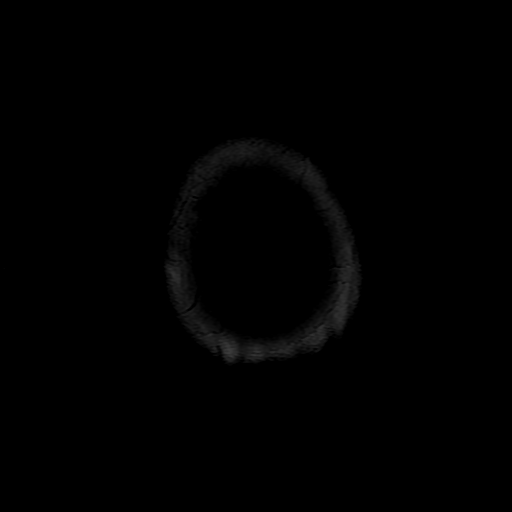

[Series 8: ax mpgr · axial · 5.0mm · 0.43mm/px · 1 of 24 slices shown]
[im 1/24]
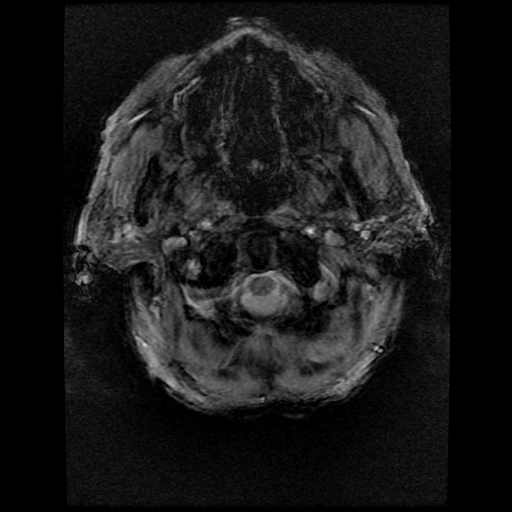

[Series 9: DWI · coronal · 5.0mm · 1.09mm/px · 7 of 64 slices shown (2 of 4)]
[im 1/64]
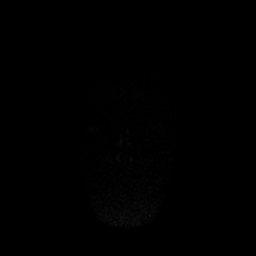
[im 11/64]
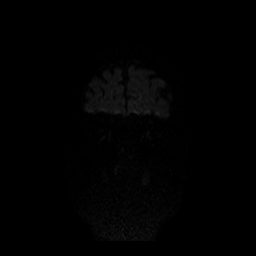
[im 22/64]
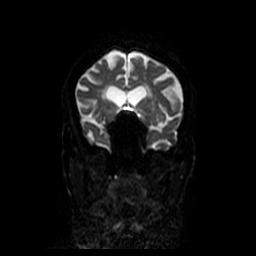
[im 32/64]
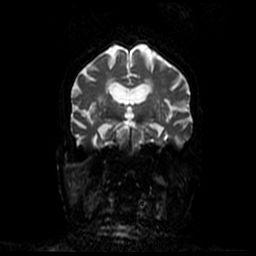
[im 43/64]
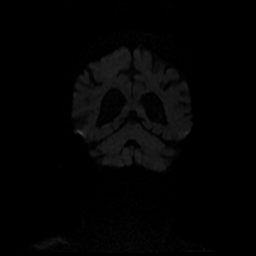
[im 53/64]
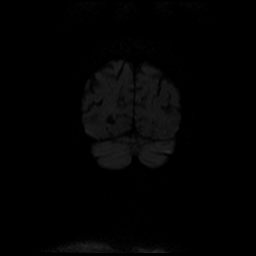
[im 64/64]
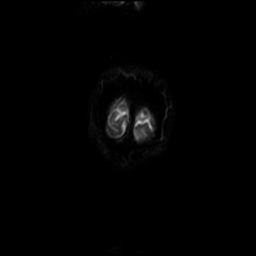

[Series 11: T2 · coronal · 5.0mm · 0.43mm/px · 3 of 27 slices shown (2 of 2)]
[im 1/27]
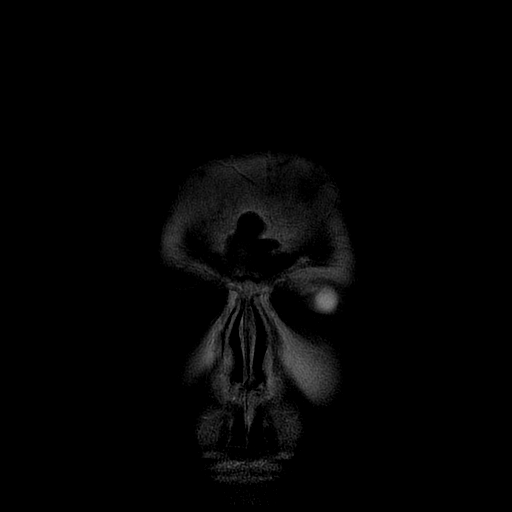
[im 14/27]
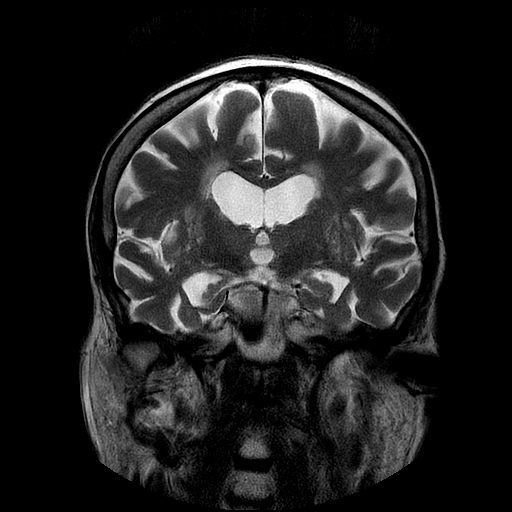
[im 27/27]
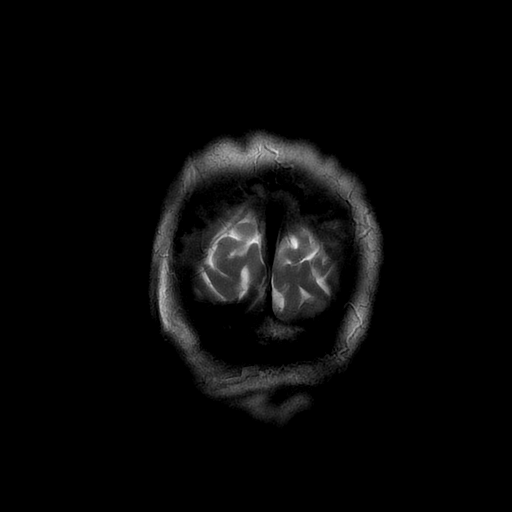

[Series 400: DWI · axial · 3.0mm · 1.09mm/px · z∈[-91,+42]mm · 5 of 48 slices shown (3 of 4)]
[im 1/48]
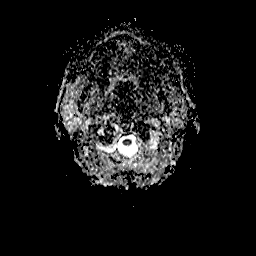
[im 12/48]
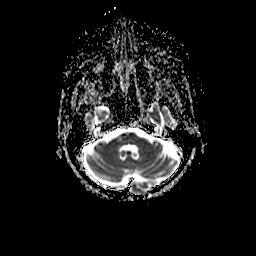
[im 24/48]
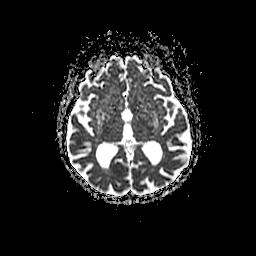
[im 36/48]
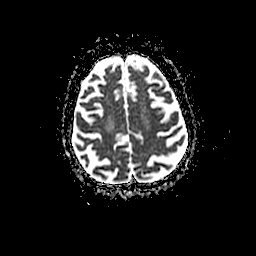
[im 48/48]
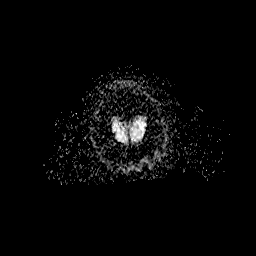

[Series 900: DWI · coronal · 5.0mm · 1.09mm/px · 3 of 32 slices shown (4 of 4)]
[im 1/32]
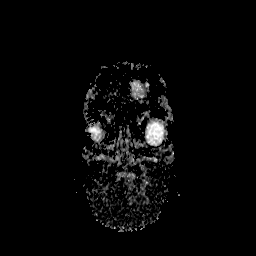
[im 16/32]
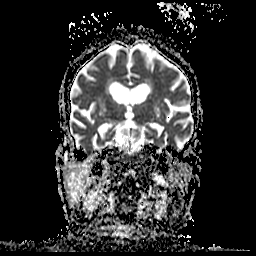
[im 32/32]
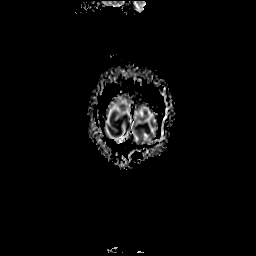

[36 of 48 positions shown; findings below may reference images not displayed]

FINDINGS: Moderate atrophy has progressed in the interval. Ventricular
enlargement has progressed due to atrophy.

Negative for acute infarct.

Chronic white matter hyperintensities throughout both cerebral
hemispheres have progressed. This is due to chronic microvascular
ischemic change. Mild chronic ischemic change in the pons
bilaterally.

Negative for intracranial hemorrhage. Negative for mass or edema. No
shift of the midline structures.

Pituitary normal in size.  Normal skullbase.

Mucosal edema in the left sphenoid sinus with air-fluid level.
Circle of Willis patent.
IMPRESSION: Progression of atrophy and chronic microvascular ischemia since 5339

No acute infarct or mass

Sphenoid sinusitis with air-fluid level.
